# Patient Record
Sex: Male | Born: 1965 | Race: Black or African American | Hispanic: No | Smoking: Current every day smoker
Health system: Southern US, Community
[De-identification: ages and names within clinical notes are randomized; demographics above are authoritative.]

## PROBLEM LIST (undated history)

## (undated) DIAGNOSIS — N183 Chronic kidney disease, stage 3 unspecified: Secondary | ICD-10-CM

## (undated) DIAGNOSIS — F1910 Other psychoactive substance abuse, uncomplicated: Secondary | ICD-10-CM

## (undated) DIAGNOSIS — B1920 Unspecified viral hepatitis C without hepatic coma: Secondary | ICD-10-CM

## (undated) DIAGNOSIS — C8589 Other specified types of non-Hodgkin lymphoma, extranodal and solid organ sites: Secondary | ICD-10-CM

## (undated) DIAGNOSIS — B20 Human immunodeficiency virus [HIV] disease: Secondary | ICD-10-CM

## (undated) DIAGNOSIS — E039 Hypothyroidism, unspecified: Secondary | ICD-10-CM

## (undated) DIAGNOSIS — R569 Unspecified convulsions: Secondary | ICD-10-CM

## (undated) DIAGNOSIS — Z21 Asymptomatic human immunodeficiency virus [HIV] infection status: Secondary | ICD-10-CM

## (undated) DIAGNOSIS — C8339 Primary central nervous system lymphoma: Secondary | ICD-10-CM

## (undated) HISTORY — PX: CRANIOTOMY: SHX93

---

## 2016-04-29 ENCOUNTER — Emergency Department: Payer: Self-pay

## 2016-04-29 ENCOUNTER — Observation Stay
Admission: EM | Admit: 2016-04-29 | Discharge: 2016-04-30 | Disposition: A | Discharge status: Home / Self Care | Payer: Self-pay | Attending: Internal Medicine | Admitting: Internal Medicine

## 2016-04-29 DIAGNOSIS — R569 Unspecified convulsions: Secondary | ICD-10-CM | POA: Insufficient documentation

## 2016-04-29 DIAGNOSIS — Z8572 Personal history of non-Hodgkin lymphomas: Secondary | ICD-10-CM | POA: Insufficient documentation

## 2016-04-29 DIAGNOSIS — N179 Acute kidney failure, unspecified: Secondary | ICD-10-CM

## 2016-04-29 DIAGNOSIS — F17200 Nicotine dependence, unspecified, uncomplicated: Secondary | ICD-10-CM | POA: Insufficient documentation

## 2016-04-29 DIAGNOSIS — Z886 Allergy status to analgesic agent status: Secondary | ICD-10-CM | POA: Insufficient documentation

## 2016-04-29 DIAGNOSIS — Z882 Allergy status to sulfonamides status: Secondary | ICD-10-CM | POA: Insufficient documentation

## 2016-04-29 DIAGNOSIS — G40909 Epilepsy, unspecified, not intractable, without status epilepticus: Secondary | ICD-10-CM | POA: Insufficient documentation

## 2016-04-29 DIAGNOSIS — F1910 Other psychoactive substance abuse, uncomplicated: Secondary | ICD-10-CM

## 2016-04-29 DIAGNOSIS — B852 Pediculosis, unspecified: Secondary | ICD-10-CM

## 2016-04-29 DIAGNOSIS — G92 Toxic encephalopathy: Secondary | ICD-10-CM | POA: Insufficient documentation

## 2016-04-29 DIAGNOSIS — E039 Hypothyroidism, unspecified: Secondary | ICD-10-CM | POA: Insufficient documentation

## 2016-04-29 DIAGNOSIS — R4182 Altered mental status, unspecified: Secondary | ICD-10-CM

## 2016-04-29 DIAGNOSIS — T407X1A Poisoning by cannabis (derivatives), accidental (unintentional), initial encounter: Secondary | ICD-10-CM | POA: Insufficient documentation

## 2016-04-29 DIAGNOSIS — T405X1A Poisoning by cocaine, accidental (unintentional), initial encounter: Principal | ICD-10-CM | POA: Insufficient documentation

## 2016-04-29 DIAGNOSIS — B1920 Unspecified viral hepatitis C without hepatic coma: Secondary | ICD-10-CM | POA: Insufficient documentation

## 2016-04-29 DIAGNOSIS — F0390 Unspecified dementia without behavioral disturbance: Secondary | ICD-10-CM | POA: Insufficient documentation

## 2016-04-29 DIAGNOSIS — I4581 Long QT syndrome: Secondary | ICD-10-CM | POA: Insufficient documentation

## 2016-04-29 DIAGNOSIS — Y929 Unspecified place or not applicable: Secondary | ICD-10-CM | POA: Insufficient documentation

## 2016-04-29 DIAGNOSIS — G9389 Other specified disorders of brain: Secondary | ICD-10-CM | POA: Insufficient documentation

## 2016-04-29 DIAGNOSIS — N183 Chronic kidney disease, stage 3 (moderate): Secondary | ICD-10-CM | POA: Insufficient documentation

## 2016-04-29 DIAGNOSIS — B20 Human immunodeficiency virus [HIV] disease: Secondary | ICD-10-CM | POA: Insufficient documentation

## 2016-04-29 DIAGNOSIS — G319 Degenerative disease of nervous system, unspecified: Secondary | ICD-10-CM | POA: Insufficient documentation

## 2016-04-29 DIAGNOSIS — F0280 Dementia in other diseases classified elsewhere without behavioral disturbance: Secondary | ICD-10-CM | POA: Insufficient documentation

## 2016-04-29 HISTORY — DX: Hypothyroidism, unspecified: E03.9

## 2016-04-29 HISTORY — DX: Chronic kidney disease, stage 3 unspecified: N18.30

## 2016-04-29 HISTORY — DX: Unspecified convulsions: R56.9

## 2016-04-29 HISTORY — DX: Primary central nervous system lymphoma: C83.390

## 2016-04-29 HISTORY — DX: Unspecified viral hepatitis C without hepatic coma: B19.20

## 2016-04-29 HISTORY — DX: Other specified types of non-hodgkin lymphoma, extranodal and solid organ sites: C85.89

## 2016-04-29 HISTORY — DX: Human immunodeficiency virus (HIV) disease: B20

## 2016-04-29 HISTORY — DX: Asymptomatic human immunodeficiency virus (hiv) infection status: Z21

## 2016-04-29 HISTORY — DX: Other psychoactive substance abuse, uncomplicated: F19.10

## 2016-04-29 HISTORY — DX: Chronic kidney disease, stage 3 (moderate): N18.3

## 2016-04-29 LAB — URINALYSIS COMPLETE WITH MICROSCOPIC (ARMC ONLY)
BACTERIA UA: NONE SEEN
Bilirubin Urine: NEGATIVE
GLUCOSE, UA: NEGATIVE mg/dL
Hgb urine dipstick: NEGATIVE
Ketones, ur: NEGATIVE mg/dL
Leukocytes, UA: NEGATIVE
Nitrite: NEGATIVE
PROTEIN: NEGATIVE mg/dL
Specific Gravity, Urine: 1.015 (ref 1.005–1.030)
Squamous Epithelial / LPF: NONE SEEN
pH: 6 (ref 5.0–8.0)

## 2016-04-29 LAB — COMPREHENSIVE METABOLIC PANEL
ALBUMIN: 3.7 g/dL (ref 3.5–5.0)
ALK PHOS: 42 U/L (ref 38–126)
ALT: 11 U/L — ABNORMAL LOW (ref 17–63)
ANION GAP: 5 (ref 5–15)
AST: 24 U/L (ref 15–41)
BILIRUBIN TOTAL: 0.1 mg/dL — AB (ref 0.3–1.2)
BUN: 13 mg/dL (ref 6–20)
CALCIUM: 8.6 mg/dL — AB (ref 8.9–10.3)
CO2: 27 mmol/L (ref 22–32)
CREATININE: 2.04 mg/dL — AB (ref 0.61–1.24)
Chloride: 109 mmol/L (ref 101–111)
GFR calc Af Amer: 43 mL/min — ABNORMAL LOW (ref >60)
GFR calc non Af Amer: 37 mL/min — ABNORMAL LOW (ref >60)
GLUCOSE: 91 mg/dL (ref 65–99)
Potassium: 3.4 mmol/L — ABNORMAL LOW (ref 3.5–5.1)
Sodium: 141 mmol/L (ref 135–145)
TOTAL PROTEIN: 7.3 g/dL (ref 6.5–8.1)

## 2016-04-29 LAB — URINE DRUG SCREEN, QUALITATIVE (ARMC ONLY)
Amphetamines, Ur Screen: NOT DETECTED
BARBITURATES, UR SCREEN: NOT DETECTED
BENZODIAZEPINE, UR SCRN: NOT DETECTED
CANNABINOID 50 NG, UR ~~LOC~~: POSITIVE — AB
Cocaine Metabolite,Ur ~~LOC~~: POSITIVE — AB
MDMA (Ecstasy)Ur Screen: NOT DETECTED
Methadone Scn, Ur: NOT DETECTED
Opiate, Ur Screen: NOT DETECTED
Phencyclidine (PCP) Ur S: NOT DETECTED
TRICYCLIC, UR SCREEN: NOT DETECTED

## 2016-04-29 LAB — CBC
HCT: 34.2 % — ABNORMAL LOW (ref 40.0–52.0)
Hemoglobin: 11.7 g/dL — ABNORMAL LOW (ref 13.0–18.0)
MCH: 35.4 pg — AB (ref 26.0–34.0)
MCHC: 34.1 g/dL (ref 32.0–36.0)
MCV: 103.8 fL — ABNORMAL HIGH (ref 80.0–100.0)
PLATELETS: 172 10*3/uL (ref 150–440)
RBC: 3.3 MIL/uL — AB (ref 4.40–5.90)
RDW: 15.1 % — AB (ref 11.5–14.5)
WBC: 6.5 10*3/uL (ref 3.8–10.6)

## 2016-04-29 LAB — TSH: TSH: 3.447 u[IU]/mL (ref 0.350–4.500)

## 2016-04-29 LAB — SALICYLATE LEVEL: Salicylate Lvl: <4 mg/dL (ref 2.8–30.0)

## 2016-04-29 LAB — TROPONIN I: Troponin I: <0.03 ng/mL (ref <0.03)

## 2016-04-29 LAB — ETHANOL: Alcohol, Ethyl (B): <5 mg/dL (ref <5)

## 2016-04-29 LAB — LIPASE, BLOOD: LIPASE: 18 U/L (ref 11–51)

## 2016-04-29 LAB — ACETAMINOPHEN LEVEL

## 2016-04-29 MED ORDER — SODIUM CHLORIDE 0.9 % IV BOLUS (SEPSIS)
1000.0000 mL | Freq: Once | INTRAVENOUS | Status: AC
  Administered 2016-04-29: 1000 mL via INTRAVENOUS

## 2016-04-29 NOTE — ED Notes (Signed)
Pt noted to be infested with crawling insects that appear to be cockroaches. Charge RN made aware.
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
## 2016-04-29 NOTE — ED Notes (Signed)
Juanetta, EDT sitting at bedside for pt safety.

## 2016-04-29 NOTE — ED Provider Notes (Signed)
[facility] Emergency Department Provider Note   ____________________________________________   First MD Initiated Contact with Patient 04/29/16 2007     (approximate)  I have reviewed the triage vital signs and the nursing notes.   HISTORY  Chief Complaint Drug Overdose   HPI Darius [name] is a 50 y.o. male with a history of drug abuse and HIV who is presenting to the emergency department with altered mental status. He was found unresponsive with pinpoint pupils and given 2 doses of intranasal Narcan. After the Narcan he then had an improvement in his mental status but was found to be hypotensive to the [id number], systolic, when standing.  At this point he has no complaints. He denies any pain. Does not report any fever. However, he is also unable to remember the events of earlier today and why he became altered. He denies any drug use.   Past Medical History:  Diagnosis Date  . CKD (chronic kidney disease), stage III   . CNS lymphoma ([place])   . Drug abuse   . Hepatitis C   . HIV (human immunodeficiency virus infection) ([place])   . Hypothyroidism   . Seizures [facility])     Patient Active Problem List   Diagnosis Date Noted  . HIV (human immunodeficiency virus infection) ([place]) 04/29/2016  . Altered mental state 04/29/2016  . Drug abuse 04/29/2016  . Hypothyroidism 04/29/2016  . Seizures ([place]) 04/29/2016    Past Surgical History:  Procedure Laterality Date  . CRANIOTOMY      Prior to Admission medications   Medication Sig Start Date End Date Taking? Authorizing Provider  abacavir-dolutegravir-lamiVUDine (TRIUMEQ) 600-50-300 MG tablet Take 1 tablet by mouth daily.   Yes Historical Provider, MD  levETIRAcetam (KEPPRA) 500 MG tablet Take 1,000 mg by mouth 2 (two) times daily.   Yes Historical Provider, MD  levothyroxine (SYNTHROID, LEVOTHROID) 50 MCG tablet Take 50 mcg by mouth daily before breakfast.   Yes Historical Provider, MD    Allergies Asa  [aspirin] and Sulfa antibiotics  Family History  Problem Relation Age of Onset  . Family history unknown: Yes    Social History Social History  Substance Use Topics  . Smoking status: Current Every Day Smoker  . Smokeless tobacco: Not on file  . Alcohol use No    Review of Systems Constitutional: No fever/chills Eyes: No visual changes. ENT: No sore throat. Cardiovascular: Denies chest pain. Respiratory: Denies shortness of breath. Gastrointestinal: No abdominal pain.  No nausea, no vomiting.  No diarrhea.  No constipation. Genitourinary: Negative for dysuria. Musculoskeletal: Negative for back pain. Skin: Negative for rash. Neurological: Negative for headaches, focal weakness or numbness.  10-point ROS otherwise negative.  ____________________________________________   PHYSICAL EXAM:  VITAL SIGNS: ED Triage Vitals  Enc Vitals Group     BP 04/29/16 1946 96/71     Pulse Rate 04/29/16 1946 74     Resp 04/29/16 1946 18     Temp 04/29/16 1946 97.4 F (36.3 C)     Temp Source 04/29/16 1946 Oral     SpO2 04/29/16 1940 98 %     Weight 04/29/16 1947 200 lb (90.7 kg)     Height 04/29/16 1947 6' (1.829 m)     Head Circumference --      Peak Flow --      Pain Score 04/29/16 1948 0     Pain Loc --      Pain Edu? --      Excl. in [place]? --  Constitutional: Alert and orientedTo self as well as birthdate. Disheveled and in no acute distress. Eyes: Conjunctivae are normal. PERRL. EOMI. Head: Atraumatic. Nose: No congestion/rhinnorhea. Mouth/Throat: Mucous membranes are moist.   Neck: No stridor.   Cardiovascular: Normal rate, regular rhythm.  Respiratory: Normal respiratory effort.  No retractions. Lungs CTAB. Gastrointestinal: Soft and nontender. No distention.  Musculoskeletal: No lower extremity tenderness nor edema.  No joint effusions. Neurologic:  Normal speech and language. No gross focal neurologic deficits are appreciated.  Skin:  Skin is warm, dry and  intact. No rash noted. Psychiatric: Odd affect but normal mood.  ____________________________________________   LABS (all labs ordered are listed, but only abnormal results are displayed)  Labs Reviewed  COMPREHENSIVE METABOLIC PANEL - Abnormal; Notable for the following:       Result Value   Potassium 3.4 (*)    Creatinine, Ser 2.04 (*)    Calcium 8.6 (*)    ALT 11 (*)    Total Bilirubin 0.1 (*)    GFR calc non Af Amer 37 (*)    GFR calc Af Amer 43 (*)    All other components within normal limits  CBC - Abnormal; Notable for the following:    RBC 3.30 (*)    Hemoglobin 11.7 (*)    HCT 34.2 (*)    MCV 103.8 (*)    MCH 35.4 (*)    RDW 15.1 (*)    All other components within normal limits  URINE DRUG SCREEN, QUALITATIVE ([facility] ONLY) - Abnormal; Notable for the following:    Cocaine Metabolite,Ur [place] POSITIVE (*)    Cannabinoid 50 Ng, Ur [place] POSITIVE (*)    All other components within normal limits  ACETAMINOPHEN LEVEL - Abnormal; Notable for the following:    Acetaminophen (Tylenol), Serum <10 (*)    All other components within normal limits  URINALYSIS COMPLETEWITH MICROSCOPIC ([facility] ONLY) - Abnormal; Notable for the following:    Color, Urine YELLOW (*)    APPearance CLEAR (*)    All other components within normal limits  ETHANOL  LIPASE, BLOOD  SALICYLATE LEVEL  TROPONIN I  TSH   ____________________________________________  EKG  ED ECG REPORT I, Doran Stabler, the attending physician, personally viewed and interpreted this ECG.   Date: 04/29/2016  EKG Time: 2012  Rate: 70  Rhythm: normal sinus rhythm  Axis: Normal axis  Intervals:none  ST&T Change: T wave inversions in 2, 3 and aVF. Also T wave inversions in V5 and V6. No ST elevation or depression. No previous EKGs for comparison.  ____________________________________________  RADIOLOGY  CT Head Wo Contrast (Accession [id number]) (Order [id number])  Imaging  Date: 04/29/2016 Department: [facility] EMERGENCY DEPARTMENT Released By/Authorizing: Orbie Pyo, MD (auto-released)  PACS Images   Show images for CT Head Wo Contrast  Study Result   CLINICAL DATA:  Possible drug overdose. Pinpoint pupils. Altered mental status.  EXAM: CT HEAD WITHOUT CONTRAST  TECHNIQUE: Contiguous axial images were obtained from the base of the skull through the vertex without intravenous contrast.  COMPARISON:  None.  FINDINGS: Diffusely enlarged ventricles and subarachnoid spaces. Patchy white matter low density in both cerebral hemispheres. Left frontal post craniotomy changes with underlying encephalomalacia and ex vacuo enlargement of the left lateral ventricle. There is also dystrophic calcification along the lateral margin of the frontal horn of the left lateral ventricle. There are also dystrophic calcifications in the central pons. A left lateral subdural membrane is demonstrated with no intracranial  hemorrhage, mass lesion or CT evidence of acute infarction.  IMPRESSION: 1. No acute abnormality. 2. Left frontal post craniotomy changes with underlying encephalomalacia and ex vacuo enlargement of the left lateral ventricle. 3. Moderate diffuse cerebral and cerebellar atrophy. 4. Mild chronic small vessel white matter ischemic changes in the right cerebral hemisphere. 5. Dystrophic calcifications, as described above.   Electronically Signed   By: Claudie Revering M.D.   On: 04/29/2016 23:00     DG Chest 1 View (Accession [id number]) (Order [id number])  Imaging  Date: 04/29/2016 Department: [facility] EMERGENCY DEPARTMENT Released By/Authorizing: Orbie Pyo, MD (auto-released)  PACS Images   Show images for DG Chest 1 View  Study Result   CLINICAL DATA:  Altered mental status  EXAM: CHEST 1 VIEW  COMPARISON:  None.  FINDINGS: Low lung volumes. Heart size and mediastinal contour are  within normal limits accounting for the low lung volumes. No pneumothorax. No pleural effusion. Mild hypoventilatory changes at the lung bases. No acute consolidative airspace disease. No pulmonary edema.  IMPRESSION: Hypoinspiratory technique.  No active disease.   Electronically Signed   By: Ilona Sorrel M.D.   On: 04/29/2016 20:32     ____________________________________________   PROCEDURES  Procedure(s) performed:   Procedures  Critical Care performed:   ____________________________________________   INITIAL IMPRESSION / ASSESSMENT AND PLAN / ED COURSE  Pertinent labs & imaging results that were available during my care of the patient were reviewed by me and considered in my medical decision making (see chart for details).  ----------------------------------------- 8:40 PM on 04/29/2016 -----------------------------------------  Patient noted by nursing to have small insects crawling on feet the patient's shoes as well as his sheets. I went back in the room also asked the patient if he has a known history of HIV as his nose on his record and he denies. He also asked me "what is this?" And points at his IV in his left upper extremity. Patient continues to have an altered mental status.  Clinical Course   ----------------------------------------- 11:13 PM on 04/29/2016 -----------------------------------------  Patient continues to be altered. His mother called the emergency department and I was able to speak with her. She says that the patient does have some baseline confusion and usually does not have an extensive conversation but will answer yes or no appropriately. She says it does seem unusual that he did not know where he was and also that he did not know what year it was born in. He was found to be positive for both cocaine and marijuana in his urine. He also has an elevated creatinine with hypotension initially. I'm now able to see his care everywhere and  he has a creatinine of what appears to be 1.5-1.9 and his baseline. It is possible that his presentation is all drug induced. However, he does not appear to really back to baseline and with what appeared to be lice requiring decontamination. I do not believe him to be safe for discharge at this time. He will be admitted to the hospital. Signed out to Dr. Jannifer Franklin.  ____________________________________________   FINAL CLINICAL IMPRESSION(S) / ED DIAGNOSES  Altered mental status. Polysubstance abuse. Acute on chronic kidney injury.    NEW MEDICATIONS STARTED DURING THIS VISIT:  New Prescriptions   No medications on file     Note:  This document was prepared using Dragon voice recognition software and may include unintentional dictation errors.    Orbie Pyo, MD 04/29/16 [phone]
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
## 2016-04-29 NOTE — ED Notes (Signed)
Pt taken into [place] shower to remove insect infested clothing. Pt's clothing double-bagged and pt dressed in blue disposable hospital scrubs.

## 2016-04-29 NOTE — ED Notes (Signed)
Mother reports PMH of brain tumor. Mother's [employer]) phone number: H [phone], Cell [phone]

## 2016-04-30 DIAGNOSIS — F0280 Dementia in other diseases classified elsewhere without behavioral disturbance: Secondary | ICD-10-CM

## 2016-04-30 DIAGNOSIS — F1910 Other psychoactive substance abuse, uncomplicated: Secondary | ICD-10-CM

## 2016-04-30 DIAGNOSIS — R410 Disorientation, unspecified: Secondary | ICD-10-CM

## 2016-04-30 LAB — BASIC METABOLIC PANEL
Anion gap: 4 — ABNORMAL LOW (ref 5–15)
BUN: 11 mg/dL (ref 6–20)
CHLORIDE: 114 mmol/L — AB (ref 101–111)
CO2: 23 mmol/L (ref 22–32)
CREATININE: 1.7 mg/dL — AB (ref 0.61–1.24)
Calcium: 8 mg/dL — ABNORMAL LOW (ref 8.9–10.3)
GFR calc Af Amer: 54 mL/min — ABNORMAL LOW (ref >60)
GFR calc non Af Amer: 46 mL/min — ABNORMAL LOW (ref >60)
Glucose, Bld: 84 mg/dL (ref 65–99)
POTASSIUM: 3.9 mmol/L (ref 3.5–5.1)
SODIUM: 141 mmol/L (ref 135–145)

## 2016-04-30 LAB — CBC
HEMATOCRIT: 32.4 % — AB (ref 40.0–52.0)
Hemoglobin: 11.1 g/dL — ABNORMAL LOW (ref 13.0–18.0)
MCH: 35.2 pg — AB (ref 26.0–34.0)
MCHC: 34.2 g/dL (ref 32.0–36.0)
MCV: 103.1 fL — AB (ref 80.0–100.0)
PLATELETS: 167 10*3/uL (ref 150–440)
RBC: 3.14 MIL/uL — AB (ref 4.40–5.90)
RDW: 15.2 % — ABNORMAL HIGH (ref 11.5–14.5)
WBC: 7.2 10*3/uL (ref 3.8–10.6)

## 2016-04-30 MED ORDER — ACETAMINOPHEN 650 MG RE SUPP: 650.0000 mg | Freq: Four times a day (QID) | RECTAL | Status: DC | PRN

## 2016-04-30 MED ORDER — ONDANSETRON HCL 4 MG PO TABS: 4.0000 mg | ORAL_TABLET | Freq: Four times a day (QID) | ORAL | Status: DC | PRN

## 2016-04-30 MED ORDER — ACETAMINOPHEN 325 MG PO TABS: 650.0000 mg | ORAL_TABLET | Freq: Four times a day (QID) | ORAL | Status: DC | PRN

## 2016-04-30 MED ORDER — SODIUM CHLORIDE 0.9% FLUSH
3.0000 mL | Freq: Two times a day (BID) | INTRAVENOUS | Status: DC
  Administered 2016-04-30 (×2): 3 mL via INTRAVENOUS

## 2016-04-30 MED ORDER — LEVOTHYROXINE SODIUM 50 MCG PO TABS
50.0000 ug | ORAL_TABLET | Freq: Every day | ORAL | Status: DC
  Administered 2016-04-30: 50 ug via ORAL
  Filled 2016-04-30: qty 1

## 2016-04-30 MED ORDER — ONDANSETRON HCL 4 MG/2ML IJ SOLN: 4.0000 mg | Freq: Four times a day (QID) | INTRAMUSCULAR | Status: DC | PRN

## 2016-04-30 MED ORDER — ABACAVIR-DOLUTEGRAVIR-LAMIVUD 600-50-300 MG PO TABS
1.0000 | ORAL_TABLET | Freq: Every day | ORAL | Status: DC
  Administered 2016-04-30: 10:00:00 1 via ORAL
  Filled 2016-04-30: qty 1

## 2016-04-30 MED ORDER — LEVETIRACETAM 500 MG PO TABS
1000.0000 mg | ORAL_TABLET | Freq: Two times a day (BID) | ORAL | Status: DC
  Administered 2016-04-30 (×2): 1000 mg via ORAL
  Filled 2016-04-30 (×2): qty 2

## 2016-04-30 MED ORDER — HEPARIN SODIUM (PORCINE) 5000 UNIT/ML IJ SOLN: 5000.0000 [IU] | Freq: Three times a day (TID) | INTRAMUSCULAR | Status: DC

## 2016-04-30 NOTE — Consult Note (Signed)
Reason for Consult:Altered mental status Referring Physician: Posey Pronto  CC: Found down  HPI: Darius [name] is an 50 y.o. male with a history of CNS lymphoma s/p surgery and a history of seizures who was found by someone on the street unresponsive and EMS was called. EMS brought him in and he initially had pinpoint pupils. He was given some Narcan and seemed to respond. Workup in the ED showed tox screen positive for cocaine and cannabinoids. Patient was minimally verbal in the ED but is currently responsive yet unable to provide much in the way of history or information. Unclear if he has been compliant with his Keppra.  Past Medical History:  Diagnosis Date  . CKD (chronic kidney disease), stage III   . CNS lymphoma ([place])   . Drug abuse   . Hepatitis C   . HIV (human immunodeficiency virus infection) ([place])   . Hypothyroidism   . Seizures ([place])     Past Surgical History:  Procedure Laterality Date  . CRANIOTOMY      Family History  Problem Relation Age of Onset  . Family history unknown: Yes    Social History:  reports that he has been smoking.  He does not have any smokeless tobacco history on file. He reports that he uses drugs. He reports that he does not drink alcohol.  Allergies  Allergen Reactions  . Asa [Aspirin]   . Sulfa Antibiotics     Medications:  I have reviewed the patient's current medications. Prior to Admission:  Prescriptions Prior to Admission  Medication Sig Dispense Refill Last Dose  . abacavir-dolutegravir-lamiVUDine (TRIUMEQ) 600-50-300 MG tablet Take 1 tablet by mouth daily.   unknown at unknown  . levETIRAcetam (KEPPRA) 500 MG tablet Take 1,000 mg by mouth 2 (two) times daily.   unknown at unknown  . levothyroxine (SYNTHROID, LEVOTHROID) 50 MCG tablet Take 50 mcg by mouth daily before breakfast.   unknown at unknown   Scheduled: . abacavir-dolutegravir-lamiVUDine  1 tablet Oral Daily  . heparin  5,000 Units Subcutaneous Q8H  . levETIRAcetam   1,000 mg Oral BID  . levothyroxine  50 mcg Oral QAC breakfast  . sodium chloride flush  3 mL Intravenous Q12H    ROS: History obtained from the patient  General ROS: negative for - chills, fatigue, fever, night sweats, weight gain or weight loss Psychological ROS: memory difficulties Ophthalmic ROS: negative for - blurry vision, double vision, eye pain or loss of vision ENT ROS: negative for - epistaxis, nasal discharge, oral lesions, sore throat, tinnitus or vertigo Allergy and Immunology ROS: negative for - hives or itchy/watery eyes Hematological and Lymphatic ROS: negative for - bleeding problems, bruising or swollen lymph nodes Endocrine ROS: negative for - galactorrhea, hair pattern changes, polydipsia/polyuria or temperature intolerance Respiratory ROS: negative for - cough, hemoptysis, shortness of breath or wheezing Cardiovascular ROS: negative for - chest pain, dyspnea on exertion, edema or irregular heartbeat Gastrointestinal ROS: negative for - abdominal pain, diarrhea, hematemesis, nausea/vomiting or stool incontinence Genito-Urinary ROS: negative for - dysuria, hematuria, incontinence or urinary frequency/urgency Musculoskeletal ROS: negative for - joint swelling or muscular weakness Neurological ROS: as noted in HPI Dermatological ROS: negative for rash and skin lesion changes  Physical Examination: Blood pressure 109/70, pulse 80, temperature 98.8 F (37.1 C), temperature source Oral, resp. rate 16, height 6' (1.829 m), weight 65.8 kg (145 lb), SpO2 100 %.  HEENT-  Normocephalic, no lesions, without obvious abnormality.  Normal external eye and conjunctiva.  Normal TM's bilaterally.  Normal auditory canals and external ears. Normal external nose, mucus membranes and septum.  Normal pharynx. Cardiovascular- S1, S2 normal, pulses palpable throughout   Lungs- chest clear, no wheezing, rales, normal symmetric air entry Abdomen- soft, non-tender; bowel sounds normal; no  masses,  no organomegaly Extremities- no edema Lymph-no adenopathy palpable Musculoskeletal-no joint tenderness, deformity or swelling Skin-warm and dry, no hyperpigmentation, vitiligo, or suspicious lesions  Neurological Examination Mental Status: Alert, oriented to name and place, thought content appropriate.  Speech fluent without evidence of aphasia.  Able to follow 3 step commands without difficulty. Cranial Nerves: II: Discs flat bilaterally; Visual fields grossly normal, pupils equal, round, reactive to light and accommodation III,IV, VI: ptosis not present, extra-ocular motions intact bilaterally V,VII: mild right facial droop, facial light touch sensation normal bilaterally VIII: hearing normal bilaterally IX,X: gag reflex present XI: bilateral shoulder shrug XII: midline tongue extension Motor: Right : Upper extremity   5/5    Left:     Upper extremity   5/5  Lower extremity   5/5     Lower extremity   5/5 Tone and bulk:normal tone throughout; no atrophy noted Sensory: Pinprick and light touch intact throughout, bilaterally Deep Tendon Reflexes: 2+ and symmetric throughout Plantars: Right: upgoing   Left: upgoing Cerebellar: Normal finger-to-nose and normal heel-to-shin testing bilaterally Gait: not tested due to safety concerns   Laboratory Studies:   Basic Metabolic Panel:  Recent Labs Lab 04/29/16 1957 04/30/16 0636  NA 141 141  K 3.4* 3.9  CL 109 114*  CO2 27 23  GLUCOSE 91 84  BUN 13 11  CREATININE 2.04* 1.70*  CALCIUM 8.6* 8.0*    Liver Function Tests:  Recent Labs Lab 04/29/16 1957  AST 24  ALT 11*  ALKPHOS 42  BILITOT 0.1*  PROT 7.3  ALBUMIN 3.7    Recent Labs Lab 04/29/16 1957  LIPASE 18   No results for input(s): AMMONIA in the last 168 hours.  CBC:  Recent Labs Lab 04/29/16 1957 04/30/16 0636  WBC 6.5 7.2  HGB 11.7* 11.1*  HCT 34.2* 32.4*  MCV 103.8* 103.1*  PLT 172 167    Cardiac Enzymes:  Recent Labs Lab  04/29/16 1957  TROPONINI <0.03    BNP: Invalid input(s): POCBNP  CBG: No results for input(s): GLUCAP in the last 168 hours.  Microbiology: No results found for this or any previous visit.  Coagulation Studies: No results for input(s): LABPROT, INR in the last 72 hours.  Urinalysis:  Recent Labs Lab 04/29/16 2154  COLORURINE YELLOW*  LABSPEC 1.015  PHURINE 6.0  GLUCOSEU NEGATIVE  HGBUR NEGATIVE  BILIRUBINUR NEGATIVE  KETONESUR NEGATIVE  PROTEINUR NEGATIVE  NITRITE NEGATIVE  LEUKOCYTESUR NEGATIVE    Lipid Panel:  No results found for: CHOL, TRIG, HDL, CHOLHDL, VLDL, LDLCALC  HgbA1C: No results found for: HGBA1C  Urine Drug Screen:     Component Value Date/Time   LABOPIA NONE DETECTED 04/29/2016 2154   COCAINSCRNUR POSITIVE (A) 04/29/2016 2154   LABBENZ NONE DETECTED 04/29/2016 2154   AMPHETMU NONE DETECTED 04/29/2016 2154   THCU POSITIVE (A) 04/29/2016 2154   LABBARB NONE DETECTED 04/29/2016 2154    Alcohol Level:  Recent Labs Lab 04/29/16 1957  ETH <5    Other results: EKG: normal sinus rhythm at 70 bpm.  Imaging: Dg Chest 1 View  Result Date: 04/29/2016 CLINICAL DATA:  Altered mental status EXAM: CHEST 1 VIEW COMPARISON:  None. FINDINGS: Low lung volumes. Heart size and mediastinal contour are within normal limits  accounting for the low lung volumes. No pneumothorax. No pleural effusion. Mild hypoventilatory changes at the lung bases. No acute consolidative airspace disease. No pulmonary edema. IMPRESSION: Hypoinspiratory technique.  No active disease. Electronically Signed   By: Ilona Sorrel M.D.   On: 04/29/2016 20:32   Ct Head Wo Contrast  Result Date: 04/29/2016 CLINICAL DATA:  Possible drug overdose. Pinpoint pupils. Altered mental status. EXAM: CT HEAD WITHOUT CONTRAST TECHNIQUE: Contiguous axial images were obtained from the base of the skull through the vertex without intravenous contrast. COMPARISON:  None. FINDINGS: Diffusely enlarged  ventricles and subarachnoid spaces. Patchy white matter low density in both cerebral hemispheres. Left frontal post craniotomy changes with underlying encephalomalacia and ex vacuo enlargement of the left lateral ventricle. There is also dystrophic calcification along the lateral margin of the frontal horn of the left lateral ventricle. There are also dystrophic calcifications in the central pons. A left lateral subdural membrane is demonstrated with no intracranial hemorrhage, mass lesion or CT evidence of acute infarction. IMPRESSION: 1. No acute abnormality. 2. Left frontal post craniotomy changes with underlying encephalomalacia and ex vacuo enlargement of the left lateral ventricle. 3. Moderate diffuse cerebral and cerebellar atrophy. 4. Mild chronic small vessel white matter ischemic changes in the right cerebral hemisphere. 5. Dystrophic calcifications, as described above. Electronically Signed   By: Claudie Revering M.D.   On: 04/29/2016 23:00     Assessment/Plan: 50 year old male presenting after being found down.  Tox screen positive.  Patient appeared to respond to Narcan.  Suspect etiology of mental status change is drug use.  Patient now at baseline.  Head CT personally reviewed and shows no acute changes.    Recommendations: 1.  No further imaging indicated 2.  Agree with continued Keppra at home dose 3.  Patient to continue outpatient follow up  Alexis Goodell, MD Neurology [phone] 04/30/2016, 12:53 PM

## 2016-04-30 NOTE — Discharge Summary (Signed)
Darius [name] NAME: Darius [name]    MR#:  [id number]  DATE OF BIRTH:  [birth year]  DATE OF ADMISSION:  04/29/2016 ADMITTING PHYSICIAN: Lance Coon, MD  DATE OF DISCHARGE: 04/30/16  PRIMARY CARE PHYSICIAN: Pcp Not In System    ADMISSION DIAGNOSIS:  [place] [id number] Polysubstance abuse [F19.10] Acute kidney injury ([place]) [N17.9] Altered mental status, unspecified altered mental status type [R41.82]  DISCHARGE DIAGNOSIS:  Acute encephalopathy due to drug use (cocaine +marijuana) H/o HIV CKD-III  SECONDARY DIAGNOSIS:   Past Medical History:  Diagnosis Date  . CKD (chronic kidney disease), stage III   . CNS lymphoma ([place])   . Drug abuse   . Hepatitis C   . HIV (human immunodeficiency virus infection) ([place])   . Hypothyroidism   . Seizures [facility])     HOSPITAL COURSE:  Darius [name]  is a 50 y.o. male who presents with Altered mental state. Patient was found by someone on the street unresponsive and EMS was called. EMS brought him in, he initially had pinpoint pupils. He was given some Narcan and seemed to respond. Workup in the ED showed tox screen positive for cocaine and cannabinoids  1.Altered mental state - unclear what happened with this patient.  -pt was dropped off on his porch and EMS was sumoned (per mother) -mentaion improved -pt does not recall event from y'day other than spending time with people in the neighborhood -no witnessed seizures  2. Drug abuse - ?unintentional -UDS positive for cocaine and cannabinoids -Dr clapacs to see  3.  HIV (human immunodeficiency virus infection) ([place]) - -cont his home meds  4.  Seizures ([place]) - we'll continue home dose antiepileptics, seizure precautions while here  5.  Hypothyroidism - home dose thyroid replacement  Spoke with mother and updated Pt is doing well. D/c home later if ok with psych CONSULTS OBTAINED:  Treatment Team:  Alexis Goodell, MD Gonzella Lex, MD  DRUG ALLERGIES:   Allergies  Allergen Reactions  . Asa [Aspirin]   . Sulfa Antibiotics     DISCHARGE MEDICATIONS:   Current Discharge Medication List    CONTINUE these medications which have NOT CHANGED   Details  abacavir-dolutegravir-lamiVUDine (TRIUMEQ) 600-50-300 MG tablet Take 1 tablet by mouth daily.    levETIRAcetam (KEPPRA) 500 MG tablet Take 1,000 mg by mouth 2 (two) times daily.    levothyroxine (SYNTHROID, LEVOTHROID) 50 MCG tablet Take 50 mcg by mouth daily before breakfast.        If you experience worsening of your admission symptoms, develop shortness of breath, life threatening emergency, suicidal or homicidal thoughts you must seek medical attention immediately by calling 911 or calling your MD immediately  if symptoms less severe.  You Must read complete instructions/literature along with all the possible adverse reactions/side effects for all the Medicines you take and that have been prescribed to you. Take any new Medicines after you have completely understood and accept all the possible adverse reactions/side effects.   Please note  You were cared for by a hospitalist during your hospital stay. If you have any questions about your discharge medications or the care you received while you were in the hospital after you are discharged, you can call the unit and asked to speak with the hospitalist on call if the hospitalist that took care of you is not available. Once you are discharged, your primary care physician will handle any further medical issues. Please note that NO  REFILLS for any discharge medications will be authorized once you are discharged, as it is imperative that you return to your primary care physician (or establish a relationship with a primary care physician if you do not have one) for your aftercare needs so that they can reassess your need for medications and monitor your lab values. Today   SUBJECTIVE   No complaints  VITAL  SIGNS:  Blood pressure 108/74, pulse 72, temperature 98.2 F (36.8 C), resp. rate 20, height 6' (1.829 m), weight 65.8 kg (145 lb), SpO2 100 %.  I/O:   Intake/Output Summary (Last 24 hours) at 04/30/16 1202 Last data filed at 04/30/16 0800  Gross per 24 hour  Intake              240 ml  Output                0 ml  Net              240 ml    PHYSICAL EXAMINATION:  GENERAL:  50 y.o.-year-old patient lying in the bed with no acute distress.  EYES: Pupils equal, round, reactive to light and accommodation. No scleral icterus. Extraocular muscles intact.  HEENT: Head atraumatic, normocephalic. Oropharynx and nasopharynx clear.  NECK:  Supple, no jugular venous distention. No thyroid enlargement, no tenderness.  LUNGS: Normal breath sounds bilaterally, no wheezing, rales,rhonchi or crepitation. No use of accessory muscles of respiration.  CARDIOVASCULAR: S1, S2 normal. No murmurs, rubs, or gallops.  ABDOMEN: Soft, non-tender, non-distended. Bowel sounds present. No organomegaly or mass.  EXTREMITIES: No pedal edema, cyanosis, or clubbing.  NEUROLOGIC: Cranial nerves II through XII are intact. Muscle strength 5/5 in all extremities. Sensation intact. Gait not checked.  PSYCHIATRIC: The patient is alert and oriented x 3.  SKIN: No obvious rash, lesion, or ulcer.   DATA REVIEW:   CBC   Recent Labs Lab 04/30/16 0636  WBC 7.2  HGB 11.1*  HCT 32.4*  PLT 167    Chemistries   Recent Labs Lab 04/29/16 1957 04/30/16 0636  NA 141 141  K 3.4* 3.9  CL 109 114*  CO2 27 23  GLUCOSE 91 84  BUN 13 11  CREATININE 2.04* 1.70*  CALCIUM 8.6* 8.0*  AST 24  --   ALT 11*  --   ALKPHOS 42  --   BILITOT 0.1*  --     Microbiology Results   No results found for this or any previous visit (from the past 240 hour(s)).  RADIOLOGY:  Dg Chest 1 View  Result Date: 04/29/2016 CLINICAL DATA:  Altered mental status EXAM: CHEST 1 VIEW COMPARISON:  None. FINDINGS: Low lung volumes. Heart size  and mediastinal contour are within normal limits accounting for the low lung volumes. No pneumothorax. No pleural effusion. Mild hypoventilatory changes at the lung bases. No acute consolidative airspace disease. No pulmonary edema. IMPRESSION: Hypoinspiratory technique.  No active disease. Electronically Signed   By: Ilona Sorrel M.D.   On: 04/29/2016 20:32   Ct Head Wo Contrast  Result Date: 04/29/2016 CLINICAL DATA:  Possible drug overdose. Pinpoint pupils. Altered mental status. EXAM: CT HEAD WITHOUT CONTRAST TECHNIQUE: Contiguous axial images were obtained from the base of the skull through the vertex without intravenous contrast. COMPARISON:  None. FINDINGS: Diffusely enlarged ventricles and subarachnoid spaces. Patchy white matter low density in both cerebral hemispheres. Left frontal post craniotomy changes with underlying encephalomalacia and ex vacuo enlargement of the left lateral ventricle. There is also dystrophic calcification  along the lateral margin of the frontal horn of the left lateral ventricle. There are also dystrophic calcifications in the central pons. A left lateral subdural membrane is demonstrated with no intracranial hemorrhage, mass lesion or CT evidence of acute infarction. IMPRESSION: 1. No acute abnormality. 2. Left frontal post craniotomy changes with underlying encephalomalacia and ex vacuo enlargement of the left lateral ventricle. 3. Moderate diffuse cerebral and cerebellar atrophy. 4. Mild chronic small vessel white matter ischemic changes in the right cerebral hemisphere. 5. Dystrophic calcifications, as described above. Electronically Signed   By: Claudie Revering M.D.   On: 04/29/2016 23:00     Management plans discussed with the patient, family and they are in agreement.  CODE STATUS:     Code Status Orders        Start     Ordered   04/30/16 0054  Full code  Continuous     04/30/16 0053    Code Status History    Date Active Date Inactive Code Status Order  ID Comments User Context   This patient has a current code status but no historical code status.      TOTAL TIME TAKING CARE OF THIS PATIENT: 40 minutes.    Hanley Rispoli M.D on 04/30/2016 at 12:02 PM  Between 7am to 6pm - Pager - [phone] After 6pm go to [email or web address] - password [email or web address] [email or web address]  Office  [phone]  CC: Primary care physician; Pcp Not In System

## 2016-04-30 NOTE — Progress Notes (Signed)
Patient discharged home per MD order. All discharge instructions given and all questions answered. 

## 2016-04-30 NOTE — Progress Notes (Signed)
Reported to Dr Posey Pronto that Dr Weber Cooks had seen patient. MD order to discharge patient home.

## 2016-04-30 NOTE — Discharge Instructions (Signed)
F/u your Infectious dz doctor in [place]

## 2016-04-30 NOTE — Consult Note (Signed)
[place] [facility] Consult   Reason for Consult:  Consult for this 50 year old man brought in to the hospital after being found unconscious at home Referring Physician:  Posey Pronto Patient Identification: Darius [name] MRN:  [id number] Principal Diagnosis: Altered mental state Diagnosis:   Patient Active Problem List   Diagnosis Date Noted  . 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 HIV (human immunodeficiency virus infection) ([place]) [Z21] 04/29/2016  . Altered mental state [R41.82] 04/29/2016  . Drug abuse [F19.10] 04/29/2016  . Hypothyroidism [E03.9] 04/29/2016  . Seizures ([place]) [R56.9] 04/29/2016    Total Time spent with patient: 1 hour  Subjective:   Darius [name] is a 50 y.o. male patient admitted with "something hit me".  HPI:  Patient interviewed. Chart reviewed. Labs and vitals reviewed. This 50 year old man was brought into the emergency room after being found unconscious and unresponsive yesterday. Patient was still showing altered mental status on admission. Eventually woke up. Now appears to be returning to his Asa line. Did not have any witnessed seizure activity here in the hospital. Patient tells me that he remembers walking down the street yesterday and then "something hit me". He clarifies that he does not mean that some object like a car hit him but that suddenly something happened in the next thing he remembers was being here in the emergency room. Patient does not have any idea what could've happened to him. He denies that he had been feeling in any way sick. Denies that he had been having any headache or any feeling of illness. He denies having been hungry or feeling dehydrated or overheated. He says he has been fully compliant with all of his prescribed medicine as usual. He says that he had been on his usual sleep schedule which is typically to sleep much of the daytime and be awake much of the night. He denies any mood symptoms and denies any  recent psychotic symptoms. Patient admits that he uses marijuana. He is a little evasive about how often he does it. I'm not sure if that's intentional or if it could be part of his memory problem. He admits that he probably smokes marijuana more than once a week but less than every day. He denies that he's been using any other drugs and denies any alcohol use.  Social history: Patient states that he lives with his mother and his own 3 year old son. He is disabled. Has little activity most of the day.  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 He tells me he is aware that he has AIDS. He says he takes his medicine and goes to the clinic at [facility] regularly. He denies knowing of any other medical problems. The patient denied to me when I ask him if he had a history of seizure disorder. This apparently is incorrect as he is Darius [name] taking any anticonvulsant on presentation.  Substance abuse history: Patient says he uses marijuana occasionally. He denies that there've been any change in the frequency of his usage. He denied that he had used any marijuana yesterday. He denied to me that he had been using any other drugs and denied alcohol abuse. I don't see anything in his old records to suggest any known past specific movement treatment for substance abuse issues.  Past Psychiatric History: Patient denies any history of psychiatric treatment. Never been in a psychiatric hospital. No history of suicidality or suicide attempts denies any history of violence. Not currently prescribed any  psychiatric medicine. Denies ever having any auditory or visual hallucinations.  Risk to Self: Is patient at risk for suicide?: No Risk to Others:   Prior Inpatient Therapy:   Prior Outpatient Therapy:    Past Medical History:  Past Medical History:  Diagnosis Date  . CKD (chronic kidney disease), stage III   . CNS lymphoma ([place])   . Drug abuse   . Hepatitis C   .  HIV (human immunodeficiency virus infection) ([place])   . Hypothyroidism   . Seizures ([place])     Past Surgical History:  Procedure Laterality Date  . CRANIOTOMY     Family History:  Family History  Problem Relation Age of Onset  . Family history unknown: Yes   Family Psychiatric  History: He denies being aware of any family history of mental illness or substance abuse problems Social History:  History  Alcohol Use No     History  Drug Use    Social History   Social History  . Marital status: Unknown    Spouse name: N/A  . Number of children: N/A  . Years of education: N/A   Social History Main Topics  . Smoking status: Current Every Day Smoker  . Smokeless tobacco: None  . Alcohol use No  . Drug use:   . Sexual activity: Not Asked   Other Topics Concern  . None   Social History Narrative  . None   Additional Social History:    Allergies:   Allergies  Allergen Reactions  . Asa [Aspirin]   . Sulfa Antibiotics     Labs:  Results for orders placed or performed during the hospital encounter of 04/29/16 (from the past 48 hour(s))  Comprehensive metabolic panel     Status: Abnormal   Collection Time: 04/29/16  7:57 PM  Result Value Ref Range   Sodium 141 135 - 145 mmol/L   Potassium 3.4 (L) 3.5 - 5.1 mmol/L   Chloride 109 101 - 111 mmol/L   CO2 27 22 - 32 mmol/L   Glucose, Bld 91 65 - 99 mg/dL   BUN 13 6 - 20 mg/dL   Creatinine, Ser 2.04 (H) 0.61 - 1.24 mg/dL   Calcium 8.6 (L) 8.9 - 10.3 mg/dL   Total Protein 7.3 6.5 - 8.1 g/dL   Albumin 3.7 3.5 - 5.0 g/dL   AST 24 15 - 41 U/L   ALT 11 (L) 17 - 63 U/L   Alkaline Phosphatase 42 38 - 126 U/L   Total Bilirubin 0.1 (L) 0.3 - 1.2 mg/dL   GFR calc non Af Amer 37 (L) >60 mL/min   GFR calc Af Amer 43 (L) >60 mL/min    Comment: (NOTE) The eGFR has been calculated using the CKD EPI equation. This calculation has not been validated in all clinical situations. eGFR's persistently <60 mL/min signify possible  Chronic Kidney Disease.    Anion gap 5 5 - 15  Ethanol     Status: None   Collection Time: 04/29/16  7:57 PM  Result Value Ref Range   Alcohol, Ethyl (B) <5 <5 mg/dL    Comment:        LOWEST DETECTABLE LIMIT FOR SERUM ALCOHOL IS 5 mg/dL FOR MEDICAL PURPOSES ONLY   cbc     Status: Abnormal   Collection Time: 04/29/16  7:57 PM  Result Value Ref Range   WBC 6.5 3.8 - 10.6 K/uL   RBC 3.30 (L) 4.40 - 5.90 MIL/uL   Hemoglobin 11.7 (L) 13.0 -  18.0 g/dL   HCT 34.2 (L) 40.0 - 52.0 %   MCV 103.8 (H) 80.0 - 100.0 fL   MCH 35.4 (H) 26.0 - 34.0 pg   MCHC 34.1 32.0 - 36.0 g/dL   RDW 15.1 (H) 11.5 - 14.5 %   Platelets 172 150 - 440 K/uL  Lipase, blood     Status: None   Collection Time: 04/29/16  7:57 PM  Result Value Ref Range   Lipase 18 11 - 51 U/L  Acetaminophen level     Status: Abnormal   Collection Time: 04/29/16  7:57 PM  Result Value Ref Range   Acetaminophen (Tylenol), Serum <10 (L) 10 - 30 ug/mL    Comment:        THERAPEUTIC CONCENTRATIONS VARY SIGNIFICANTLY. A RANGE OF 10-30 ug/mL MAY BE AN EFFECTIVE CONCENTRATION FOR MANY PATIENTS. HOWEVER, SOME ARE BEST TREATED AT CONCENTRATIONS OUTSIDE THIS RANGE. ACETAMINOPHEN CONCENTRATIONS >150 ug/mL AT 4 HOURS AFTER INGESTION AND >50 ug/mL AT 12 HOURS AFTER INGESTION ARE OFTEN ASSOCIATED WITH TOXIC REACTIONS.   Salicylate level     Status: None   Collection Time: 04/29/16  7:57 PM  Result Value Ref Range   Salicylate Lvl <4.9 2.8 - 30.0 mg/dL  Troponin I     Status: None   Collection Time: 04/29/16  7:57 PM  Result Value Ref Range   Troponin I <0.03 <0.03 ng/mL  TSH     Status: None   Collection Time: 04/29/16  7:57 PM  Result Value Ref Range   TSH 3.447 0.350 - 4.500 uIU/mL  Urine Drug Screen, Qualitative     Status: Abnormal   Collection Time: 04/29/16  9:54 PM  Result Value Ref Range   Tricyclic, Ur Screen NONE DETECTED NONE DETECTED   Amphetamines, Ur Screen NONE DETECTED NONE DETECTED   MDMA (Ecstasy)Ur  Screen NONE DETECTED NONE DETECTED   Cocaine Metabolite,Ur [place] POSITIVE (A) NONE DETECTED   Opiate, Ur Screen NONE DETECTED NONE DETECTED   Phencyclidine (PCP) Ur S NONE DETECTED NONE DETECTED   Cannabinoid 50 Ng, Ur [place] POSITIVE (A) NONE DETECTED   Barbiturates, Ur Screen NONE DETECTED NONE DETECTED   Benzodiazepine, Ur Scrn NONE DETECTED NONE DETECTED   Methadone Scn, Ur NONE DETECTED NONE DETECTED    Comment: (NOTE) 449  Tricyclics, urine               Cutoff 1000 ng/mL 200  Amphetamines, urine             Cutoff 1000 ng/mL 300  MDMA (Ecstasy), urine           Cutoff 500 ng/mL 400  Cocaine Metabolite, urine       Cutoff 300 ng/mL 500  Opiate, urine                   Cutoff 300 ng/mL 600  Phencyclidine (PCP), urine      Cutoff 25 ng/mL 700  Cannabinoid, urine              Cutoff 50 ng/mL 800  Barbiturates, urine             Cutoff 200 ng/mL 900  Benzodiazepine, urine           Cutoff 200 ng/mL 1000 Methadone, urine                Cutoff 300 ng/mL 1100 1200 The urine drug screen provides only a preliminary, unconfirmed 1300 analytical test result and should not be used for non-medical  1400 purposes. Clinical consideration and professional judgment should 1500 be applied to any positive drug screen result due to possible 1600 interfering substances. A more specific alternate chemical method 1700 must be used in order to obtain a confirmed analytical result.  1800 Gas chromato graphy / mass spectrometry (GC/MS) is the preferred 1900 confirmatory method.   Urinalysis complete, with microscopic ([facility] only)     Status: Abnormal   Collection Time: 04/29/16  9:54 PM  Result Value Ref Range   Color, Urine YELLOW (A) YELLOW   APPearance CLEAR (A) CLEAR   Glucose, UA NEGATIVE NEGATIVE mg/dL   Bilirubin Urine NEGATIVE NEGATIVE   Ketones, ur NEGATIVE NEGATIVE mg/dL   Specific Gravity, Urine 1.015 1.005 - 1.030   Hgb urine dipstick NEGATIVE NEGATIVE   pH 6.0 5.0 - 8.0   Protein, ur  NEGATIVE NEGATIVE mg/dL   Nitrite NEGATIVE NEGATIVE   Leukocytes, UA NEGATIVE NEGATIVE   RBC / HPF 0-5 0 - 5 RBC/hpf   WBC, UA 0-5 0 - 5 WBC/hpf   Bacteria, UA NONE SEEN NONE SEEN   Squamous Epithelial / LPF NONE SEEN NONE SEEN  Basic metabolic panel     Status: Abnormal   Collection Time: 04/30/16  6:36 AM  Result Value Ref Range   Sodium 141 135 - 145 mmol/L   Potassium 3.9 3.5 - 5.1 mmol/L   Chloride 114 (H) 101 - 111 mmol/L   CO2 23 22 - 32 mmol/L   Glucose, Bld 84 65 - 99 mg/dL   BUN 11 6 - 20 mg/dL   Creatinine, Ser 1.70 (H) 0.61 - 1.24 mg/dL   Calcium 8.0 (L) 8.9 - 10.3 mg/dL   GFR calc non Af Amer 46 (L) >60 mL/min   GFR calc Af Amer 54 (L) >60 mL/min    Comment: (NOTE) The eGFR has been calculated using the CKD EPI equation. This calculation has not been validated in all clinical situations. eGFR's persistently <60 mL/min signify possible Chronic Kidney Disease.    Anion gap 4 (L) 5 - 15  CBC     Status: Abnormal   Collection Time: 04/30/16  6:36 AM  Result Value Ref Range   WBC 7.2 3.8 - 10.6 K/uL   RBC 3.14 (L) 4.40 - 5.90 MIL/uL   Hemoglobin 11.1 (L) 13.0 - 18.0 g/dL   HCT 32.4 (L) 40.0 - 52.0 %   MCV 103.1 (H) 80.0 - 100.0 fL   MCH 35.2 (H) 26.0 - 34.0 pg   MCHC 34.2 32.0 - 36.0 g/dL   RDW 15.2 (H) 11.5 - 14.5 %   Platelets 167 150 - 440 K/uL    Current Facility-Administered Medications  Medication Dose Route Frequency Provider Last Rate Last Dose  . abacavir-dolutegravir-lamiVUDine (TRIUMEQ) [phone] MG per tablet 1 tablet  1 tablet Oral Daily Lance Coon, MD   1 tablet at 04/30/16 1008  . acetaminophen (TYLENOL) tablet 650 mg  650 mg Oral Q6H PRN Lance Coon, MD       Or  . acetaminophen (TYLENOL) suppository 650 mg  650 mg Rectal Q6H PRN Lance Coon, MD      . heparin injection 5,000 Units  5,000 Units Subcutaneous Q8H Lance Coon, MD      . levETIRAcetam (KEPPRA) tablet 1,000 mg  1,000 mg Oral BID Lance Coon, MD   1,000 mg at 04/30/16 1008  .  levothyroxine (SYNTHROID, LEVOTHROID) tablet 50 mcg  50 mcg Oral QAC breakfast Lance Coon, MD   50 mcg at 04/30/16 1008  .  ondansetron (ZOFRAN) tablet 4 mg  4 mg Oral Q6H PRN Lance Coon, MD       Or  . ondansetron [facility]) injection 4 mg  4 mg Intravenous Q6H PRN Lance Coon, MD      . sodium chloride flush (NS) 0.9 % injection 3 mL  3 mL Intravenous Q12H Lance Coon, MD   3 mL at 04/30/16 1008    Musculoskeletal: Strength & Muscle Tone: decreased Gait & Station: ataxic Patient leans: N/A  Psychiatric Specialty Exam: Physical Exam  Nursing note and vitals reviewed. Constitutional: He appears well-developed and well-nourished.  HENT:  Head: Normocephalic and atraumatic.  Eyes: Conjunctivae are normal. Pupils are equal, round, and reactive to light.  Neck: Normal range of motion.  Cardiovascular: Normal heart sounds.   Respiratory: Effort normal.  GI: Soft.  Musculoskeletal: Normal range of motion.  Neurological: He is alert. He exhibits abnormal muscle tone.  Skin: Skin is warm and dry.  Psychiatric: His affect is blunt. His speech is delayed. He is slowed. Thought content is not delusional. He expresses impulsivity. He expresses no suicidal ideation. He exhibits abnormal recent memory and abnormal remote memory.    Review of Systems  Constitutional: Negative.   HENT: Negative.   Eyes: Negative.   Respiratory: Negative.   Cardiovascular: Negative.   Gastrointestinal: Negative.   Musculoskeletal: Negative.   Skin: Negative.   Neurological: Negative.   Psychiatric/Behavioral: Positive for memory loss and substance abuse. Negative for depression, hallucinations and suicidal ideas. The patient has insomnia. The patient is not nervous/anxious.     Blood pressure 109/70, pulse 80, temperature 98.8 F (37.1 C), temperature source Oral, resp. rate 16, height 6' (1.829 m), weight 65.8 kg (145 lb), SpO2 100 %.Body mass index is 19.67 kg/m.  General Appearance: Casual  Eye  Contact:  Fair  Speech:  Slow and Slurred  Volume:  Decreased  Mood:  Euthymic  Affect:  Blunt  Thought Process:  Coherent  Orientation:  Other:  Patient knew he was in the hospital. He did not know which hospital he was in and he was wrong about both the day and the year and the month  Thought Content:  Tangential  Suicidal Thoughts:  No  Homicidal Thoughts:  No  Memory:  Immediate;   Fair Recent;   Poor Remote;   Fair  Judgement:  Impaired  Insight:  Shallow  Psychomotor Activity:  Decreased  Concentration:  Concentration: Poor  Recall:  Poor  Fund of Knowledge:  Fair  Language:  Fair  Akathisia:  No  Handed:  Right  AIMS (if indicated):     Assets:  Financial Resources/Insurance Housing Resilience Social Support  ADL's:  Intact  Cognition:  Impaired,  Mild and Moderate  Sleep:        Treatment Plan Summary: Plan This is a 50 year old man who apparently has chronic dementia presumably related to his history of CNS lymphoma and HIV disease. He tells me that he is feeling back to his baseline right now. He tells me also that being slow mentally is chronic for him since having his illness. I would agree with neurology that the most likely etiology of his presentation yesterday was drug use. He had cocaine positive in his drug screen. Certainly seems possible that he could've had a seizure related to cocaine use even though he is compliant with his anticonvulsant. Patient was educated about the dangers of continued abuse of marijuana and cocaine particularly given his history of neurologic illness and the risk  he continues to run of seizures or other bad outcome. He acknowledges that and says he is planning to try to cut down on his substance use. No indication for any psychiatric medicine or psychiatric hospitalization. No changed anything else in the treatment plan. Patient can otherwise be discharged as planned by hospitalist.  Disposition: Patient does not meet criteria for  psychiatric inpatient admission. Supportive therapy provided about ongoing stressors.  Alethia Berthold, MD 04/30/2016 1:15 PM

## 2016-04-30 NOTE — BH Assessment (Signed)
Writer informed psychiatrist (Dr. Clapacs) of consult. 

## 2016-12-13 ENCOUNTER — Emergency Department: Payer: Medicare Other

## 2016-12-13 ENCOUNTER — Inpatient Hospital Stay
Admission: EM | Admit: 2016-12-13 | Discharge: 2016-12-29 | DRG: 974 | Disposition: A | Discharge status: Skilled Nursing Facility | Payer: Medicare Other | Attending: Internal Medicine | Admitting: Internal Medicine

## 2016-12-13 ENCOUNTER — Encounter: Payer: Self-pay | Admitting: Emergency Medicine

## 2016-12-13 DIAGNOSIS — I129 Hypertensive chronic kidney disease with stage 1 through stage 4 chronic kidney disease, or unspecified chronic kidney disease: Secondary | ICD-10-CM | POA: Diagnosis not present

## 2016-12-13 DIAGNOSIS — R569 Unspecified convulsions: Secondary | ICD-10-CM

## 2016-12-13 DIAGNOSIS — F1410 Cocaine abuse, uncomplicated: Secondary | ICD-10-CM | POA: Diagnosis present

## 2016-12-13 DIAGNOSIS — R1310 Dysphagia, unspecified: Secondary | ICD-10-CM | POA: Diagnosis present

## 2016-12-13 DIAGNOSIS — R0602 Shortness of breath: Secondary | ICD-10-CM

## 2016-12-13 DIAGNOSIS — E43 Unspecified severe protein-calorie malnutrition: Secondary | ICD-10-CM | POA: Diagnosis present

## 2016-12-13 DIAGNOSIS — E039 Hypothyroidism, unspecified: Secondary | ICD-10-CM | POA: Diagnosis present

## 2016-12-13 DIAGNOSIS — H518 Other specified disorders of binocular movement: Secondary | ICD-10-CM | POA: Diagnosis not present

## 2016-12-13 DIAGNOSIS — K2980 Duodenitis without bleeding: Secondary | ICD-10-CM | POA: Diagnosis present

## 2016-12-13 DIAGNOSIS — Z79899 Other long term (current) drug therapy: Secondary | ICD-10-CM

## 2016-12-13 DIAGNOSIS — Z8572 Personal history of non-Hodgkin lymphomas: Secondary | ICD-10-CM

## 2016-12-13 DIAGNOSIS — Z888 Allergy status to other drugs, medicaments and biological substances status: Secondary | ICD-10-CM

## 2016-12-13 DIAGNOSIS — R630 Anorexia: Secondary | ICD-10-CM

## 2016-12-13 DIAGNOSIS — Z882 Allergy status to sulfonamides status: Secondary | ICD-10-CM | POA: Diagnosis not present

## 2016-12-13 DIAGNOSIS — Z23 Encounter for immunization: Secondary | ICD-10-CM

## 2016-12-13 DIAGNOSIS — F1910 Other psychoactive substance abuse, uncomplicated: Secondary | ICD-10-CM | POA: Diagnosis present

## 2016-12-13 DIAGNOSIS — Z681 Body mass index (BMI) 19 or less, adult: Secondary | ICD-10-CM | POA: Diagnosis not present

## 2016-12-13 DIAGNOSIS — R410 Disorientation, unspecified: Secondary | ICD-10-CM

## 2016-12-13 DIAGNOSIS — N183 Chronic kidney disease, stage 3 (moderate): Secondary | ICD-10-CM | POA: Diagnosis present

## 2016-12-13 DIAGNOSIS — Z4659 Encounter for fitting and adjustment of other gastrointestinal appliance and device: Secondary | ICD-10-CM

## 2016-12-13 DIAGNOSIS — B20 Human immunodeficiency virus [HIV] disease: Secondary | ICD-10-CM | POA: Diagnosis present

## 2016-12-13 DIAGNOSIS — J189 Pneumonia, unspecified organism: Secondary | ICD-10-CM | POA: Diagnosis present

## 2016-12-13 DIAGNOSIS — R4182 Altered mental status, unspecified: Secondary | ICD-10-CM | POA: Diagnosis present

## 2016-12-13 DIAGNOSIS — F17200 Nicotine dependence, unspecified, uncomplicated: Secondary | ICD-10-CM | POA: Diagnosis present

## 2016-12-13 DIAGNOSIS — B1920 Unspecified viral hepatitis C without hepatic coma: Secondary | ICD-10-CM | POA: Diagnosis present

## 2016-12-13 DIAGNOSIS — G9340 Encephalopathy, unspecified: Principal | ICD-10-CM

## 2016-12-13 DIAGNOSIS — F329 Major depressive disorder, single episode, unspecified: Secondary | ICD-10-CM | POA: Diagnosis not present

## 2016-12-13 DIAGNOSIS — B370 Candidal stomatitis: Secondary | ICD-10-CM | POA: Diagnosis present

## 2016-12-13 DIAGNOSIS — G40909 Epilepsy, unspecified, not intractable, without status epilepticus: Secondary | ICD-10-CM | POA: Diagnosis present

## 2016-12-13 DIAGNOSIS — Z515 Encounter for palliative care: Secondary | ICD-10-CM

## 2016-12-13 DIAGNOSIS — Z7189 Other specified counseling: Secondary | ICD-10-CM

## 2016-12-13 LAB — COMPREHENSIVE METABOLIC PANEL
ALT: 27 U/L (ref 17–63)
ANION GAP: 10 (ref 5–15)
AST: 50 U/L — ABNORMAL HIGH (ref 15–41)
Albumin: 4.9 g/dL (ref 3.5–5.0)
Alkaline Phosphatase: 66 U/L (ref 38–126)
BUN: 21 mg/dL — AB (ref 6–20)
CALCIUM: 10 mg/dL (ref 8.9–10.3)
CHLORIDE: 110 mmol/L (ref 101–111)
CO2: 27 mmol/L (ref 22–32)
CREATININE: 1.56 mg/dL — AB (ref 0.61–1.24)
GFR, EST AFRICAN AMERICAN: 59 mL/min — AB (ref >60)
GFR, EST NON AFRICAN AMERICAN: 51 mL/min — AB (ref >60)
Glucose, Bld: 110 mg/dL — ABNORMAL HIGH (ref 65–99)
POTASSIUM: 3.8 mmol/L (ref 3.5–5.1)
SODIUM: 147 mmol/L — AB (ref 135–145)
Total Bilirubin: 0.6 mg/dL (ref 0.3–1.2)
Total Protein: 10.5 g/dL — ABNORMAL HIGH (ref 6.5–8.1)

## 2016-12-13 LAB — LACTIC ACID, PLASMA: LACTIC ACID, VENOUS: 3 mmol/L — AB (ref 0.5–1.9)

## 2016-12-13 LAB — URINALYSIS, COMPLETE (UACMP) WITH MICROSCOPIC
BACTERIA UA: NONE SEEN
Bilirubin Urine: NEGATIVE
Glucose, UA: NEGATIVE mg/dL
Hgb urine dipstick: NEGATIVE
Ketones, ur: NEGATIVE mg/dL
Leukocytes, UA: NEGATIVE
NITRITE: NEGATIVE
PH: 5 (ref 5.0–8.0)
Protein, ur: 100 mg/dL — AB
SPECIFIC GRAVITY, URINE: 1.028 (ref 1.005–1.030)
SQUAMOUS EPITHELIAL / LPF: NONE SEEN
WBC, UA: NONE SEEN WBC/hpf (ref 0–5)

## 2016-12-13 LAB — CBC WITH DIFFERENTIAL/PLATELET
Basophils Absolute: 0 10*3/uL (ref 0–0.1)
Basophils Relative: 0 %
EOS ABS: 0 10*3/uL (ref 0–0.7)
EOS PCT: 0 %
HCT: 42.2 % (ref 40.0–52.0)
Hemoglobin: 14.1 g/dL (ref 13.0–18.0)
LYMPHS ABS: 1.7 10*3/uL (ref 1.0–3.6)
Lymphocytes Relative: 17 %
MCH: 33.5 pg (ref 26.0–34.0)
MCHC: 33.5 g/dL (ref 32.0–36.0)
MCV: 99.9 fL (ref 80.0–100.0)
Monocytes Absolute: 1.2 10*3/uL — ABNORMAL HIGH (ref 0.2–1.0)
Monocytes Relative: 11 %
Neutro Abs: 7.5 10*3/uL — ABNORMAL HIGH (ref 1.4–6.5)
Neutrophils Relative %: 72 %
PLATELETS: 191 10*3/uL (ref 150–440)
RBC: 4.22 MIL/uL — AB (ref 4.40–5.90)
RDW: 15.3 % — ABNORMAL HIGH (ref 11.5–14.5)
WBC: 10.5 10*3/uL (ref 3.8–10.6)

## 2016-12-13 LAB — URINE DRUG SCREEN, QUALITATIVE (ARMC ONLY)
AMPHETAMINES, UR SCREEN: NOT DETECTED
Barbiturates, Ur Screen: NOT DETECTED
Benzodiazepine, Ur Scrn: NOT DETECTED
Cannabinoid 50 Ng, Ur ~~LOC~~: NOT DETECTED
Cocaine Metabolite,Ur ~~LOC~~: POSITIVE — AB
MDMA (ECSTASY) UR SCREEN: NOT DETECTED
Methadone Scn, Ur: NOT DETECTED
Opiate, Ur Screen: NOT DETECTED
Phencyclidine (PCP) Ur S: NOT DETECTED
TRICYCLIC, UR SCREEN: NOT DETECTED

## 2016-12-13 LAB — INFLUENZA PANEL BY PCR (TYPE A & B)
INFLBPCR: NEGATIVE
Influenza A By PCR: NEGATIVE

## 2016-12-13 LAB — AMMONIA: AMMONIA: 37 umol/L — AB (ref 9–35)

## 2016-12-13 LAB — ETHANOL

## 2016-12-13 LAB — LIPASE, BLOOD: Lipase: <10 U/L — ABNORMAL LOW (ref 11–51)

## 2016-12-13 MED ORDER — PIPERACILLIN-TAZOBACTAM 3.375 G IVPB
3.3750 g | Freq: Three times a day (TID) | INTRAVENOUS | Status: DC
  Administered 2016-12-14 (×2): 3.375 g via INTRAVENOUS
  Filled 2016-12-13 (×2): qty 50

## 2016-12-13 MED ORDER — VANCOMYCIN HCL IN DEXTROSE 750-5 MG/150ML-% IV SOLN
750.0000 mg | Freq: Two times a day (BID) | INTRAVENOUS | Status: DC
  Administered 2016-12-14 – 2016-12-15 (×3): 750 mg via INTRAVENOUS
  Filled 2016-12-13 (×5): qty 150

## 2016-12-13 MED ORDER — PIPERACILLIN-TAZOBACTAM 3.375 G IVPB 30 MIN
3.3750 g | Freq: Once | INTRAVENOUS | Status: AC
Start: 2016-12-13 — End: 2016-12-13
  Administered 2016-12-13: 3.375 g via INTRAVENOUS
  Filled 2016-12-13: qty 50

## 2016-12-13 MED ORDER — VANCOMYCIN HCL IN DEXTROSE 1-5 GM/200ML-% IV SOLN
1000.0000 mg | Freq: Once | INTRAVENOUS | Status: AC
  Administered 2016-12-13: 1000 mg via INTRAVENOUS
  Filled 2016-12-13: qty 200

## 2016-12-13 NOTE — H&P (Signed)
[place] [facility] [place] NAME: Darius [name]    MR#:  [id number]  DATE OF BIRTH:  1966/07/07  DATE OF ADMISSION:  12/13/2016  PRIMARY CARE PHYSICIAN: Pcp Not In System   REQUESTING/REFERRING PHYSICIAN: Clearnce Hasten, MD  CHIEF COMPLAINT:   Chief Complaint  Patient presents with  . Weakness    HISTORY OF PRESENT ILLNESS:  Darius [name]  is a 51 y.o. male who presents with Altered mental status. Patient's mother found him slumped over in his chair and unresponsive. Workup here in the states only elevated lactic acid and cocaine positive urine tox screen. Patient has been admitted in the past with seizures, likely induced by substance abuse. However, he does have HIV, so he was given IV antibiotics upfront for concern of possible infection. Hospitals were called for admission.  PAST MEDICAL HISTORY:   Past Medical History:  Diagnosis Date  . CKD (chronic kidney disease), stage III   . CNS lymphoma ([place])   . Drug abuse   . Hepatitis C   . HIV (human immunodeficiency virus infection) ([place])   . Hypothyroidism   . Seizures ([place])     PAST SURGICAL HISTORY:   Past Surgical History:  Procedure Laterality Date  . CRANIOTOMY      SOCIAL HISTORY:   Social History  Substance Use Topics  . Smoking status: Current Every Day Smoker  . Smokeless tobacco: Never Used  . Alcohol use No    FAMILY HISTORY:   Family History  Problem Relation Age of Onset  . Family history unknown: Yes    DRUG ALLERGIES:   Allergies  Allergen Reactions  . Asa [Aspirin]   . Sulfa Antibiotics     MEDICATIONS AT HOME:   Prior to Admission medications   Medication Sig Start Date End Date Taking? Authorizing Provider  abacavir-dolutegravir-lamiVUDine (TRIUMEQ) [id number] MG tablet Take 1 tablet by mouth daily.    Historical Provider, MD  levETIRAcetam (KEPPRA) 500 MG tablet Take 1,000 mg by mouth 2 (two) times daily.    Historical Provider, MD   levothyroxine (SYNTHROID, LEVOTHROID) 50 MCG tablet Take 50 mcg by mouth daily before breakfast.    Historical Provider, MD    REVIEW OF SYSTEMS:  Review of Systems  Unable to perform ROS: Acuity of condition     VITAL SIGNS:   Vitals:   12/13/16 2030 12/13/16 2100 12/13/16 2130 12/13/16 2300  BP: (!) 156/115 (!) 197/124 (!) 200/120 (!) 183/120  Pulse: 70 78 74 70  Resp: 11 15 12    Temp:      TempSrc:      SpO2: 97% 99% 99% 100%  Weight:      Height:       Wt Readings from Last 3 Encounters:  12/13/16 68 kg (150 lb)  04/30/16 65.8 kg (145 lb)    PHYSICAL EXAMINATION:  Physical Exam  Vitals reviewed. Constitutional: He appears well-developed and well-nourished. No distress.  HENT:  Head: Normocephalic and atraumatic.  Mouth/Throat: Oropharynx is clear and moist.  Eyes: Conjunctivae and EOM are normal. Pupils are equal, round, and reactive to light. No scleral icterus.  Neck: Normal range of motion. Neck supple. No JVD present. No thyromegaly present.  Cardiovascular: Normal rate, regular rhythm and intact distal pulses.  Exam reveals no gallop and no friction rub.   No murmur heard. Respiratory: Effort normal and breath sounds normal. No respiratory distress. He has no wheezes. He has no rales.  GI: Soft. Bowel sounds are  normal. He exhibits no distension. There is no tenderness.  Musculoskeletal: Normal range of motion. He exhibits no edema.  No arthritis, no gout  Lymphadenopathy:    He has no cervical adenopathy.  Neurological:  Unable to fully assess due to patient condition.  Patient will open eyes to verbal stimulation, but will not follow any commands  Skin: Skin is warm and dry. No rash noted. No erythema.  Psychiatric:  Unable to assess due to patient condition    LABORATORY PANEL:   CBC  Recent Labs Lab 12/13/16 2013  WBC 10.5  HGB 14.1  HCT 42.2  PLT 191    ------------------------------------------------------------------------------------------------------------------  Chemistries   Recent Labs Lab 12/13/16 2013  NA 147*  K 3.8  CL 110  CO2 27  GLUCOSE 110*  BUN 21*  CREATININE 1.56*  CALCIUM 10.0  AST 50*  ALT 27  ALKPHOS 66  BILITOT 0.6   ------------------------------------------------------------------------------------------------------------------  Cardiac Enzymes No results for input(s): TROPONINI in the last 168 hours. ------------------------------------------------------------------------------------------------------------------  RADIOLOGY:  Ct Head Wo Contrast  Result Date: 12/13/2016 CLINICAL DATA:  Acute onset of generalized weakness. Patient not speaking. Initial encounter. EXAM: CT HEAD WITHOUT CONTRAST TECHNIQUE: Contiguous axial images were obtained from the base of the skull through the vertex without intravenous contrast. COMPARISON:  CT of the head performed 04/29/2016 FINDINGS: Brain: No evidence of acute infarction, hemorrhage, hydrocephalus, extra-axial collection or mass lesion/mass effect. Prominence of the ventricles and sulci reflects moderate cortical volume loss. Mild cerebellar atrophy is noted. Scattered periventricular and subcortical white matter change likely reflects small vessel ischemic microangiopathy. Chronic encephalomalacia is noted at the left basal ganglia, and there is ex vacuo dilatation of the left lateral ventricle. Calcification is seen at the pons. No mass effect or midline shift is seen. Chronic subdural thickening is noted on the left side. Vascular: No hyperdense vessel or unexpected calcification. Skull: There is no evidence of fracture; the patient is status post left frontoparietal craniotomy. Sinuses/Orbits: The visualized portions of the orbits are within normal limits. The paranasal sinuses and mastoid air cells are well-aerated. Other: No significant soft tissue abnormalities  are seen. IMPRESSION: 1. No acute intracranial pathology seen on CT. 2. Moderate cortical volume loss and scattered small vessel ischemic microangiopathy. 3. Chronic encephalomalacia at the left basal ganglia, with ex vacuo dilatation of the left lateral ventricle. Chronic subdural thickening on the left side. Electronically Signed   By: Garald Balding M.D.   On: 12/13/2016 23:02   Dg Chest Port 1 View  Result Date: 12/13/2016 CLINICAL DATA:  Sepsis EXAM: PORTABLE CHEST 1 VIEW COMPARISON:  04/29/2014 FINDINGS: Cardiomediastinal silhouette is stable. No infiltrate or pleural effusion. No pulmonary edema. Bony thorax is unremarkable. IMPRESSION: No active disease. Electronically Signed   By: Lahoma Crocker M.D.   On: 12/13/2016 21:41    EKG:   Orders placed or performed during the hospital encounter of 12/13/16  . ED EKG 12-Lead  . ED EKG 12-Lead  . EKG 12-Lead  . EKG 12-Lead  . EKG 12-Lead  . EKG 12-Lead  . EKG 12-Lead  . EKG 12-Lead    IMPRESSION AND PLAN:  Principal Problem:   Altered mental state - unclear etiology, patient does not have any signs or symptoms of meningismus or other infection, and his current state could be consistent with a postictal state. However, exact etiology of this point is unclear. Treatment as below. Active Problems:   Drug abuse - patient is cocaine positive on urine tox screen.  Substance abuse could possibly contribute seizure   HIV (human immunodeficiency virus infection) ([place]) - infectious disease consult for assistance, IV antibiotics continued at this time   Seizures ([place]) - IV Keppra ordered, neurology consult   Hypothyroidism - home dose thyroid replacement  All the records are reviewed and case discussed with ED provider. Management plans discussed with the patient and/or family.  DVT PROPHYLAXIS: SubQ lovenox  GI PROPHYLAXIS: None  ADMISSION STATUS: Observation  CODE STATUS: Full Code Status History    Date Active Date Inactive Code Status  Order ID Comments User Context   04/30/2016 12:53 AM 04/30/2016  5:45 PM Full Code [id number]  Lance Coon, MD Inpatient      TOTAL TIME TAKING CARE OF THIS PATIENT: 40 minutes.    Louvenia Golomb Hunt 12/13/2016, 11:17 PM  [facility]  Office  [phone]  CC: Primary care physician; Pcp Not In System

## 2016-12-13 NOTE — ED Notes (Signed)
Patient taken to CT.

## 2016-12-13 NOTE — ED Provider Notes (Signed)
[facility] Emergency Department Provider Note  ____________________________________________  Time seen: Approximately 8:14 PM  I have reviewed the triage vital signs and the nursing notes.   HISTORY  Chief Complaint Weakness  Level 5 caveat:  Portions of the history and physical were unable to be obtained due to AMS   HPI Darius [name] is a 51 y.o. 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 According to EMS patient was found unresponsive and slumped over in his apartment by his mother. According to EMS he was baseline last night. Per EMS vitals BP 173/116, temp 100F. Patient is responsive to name and opens his eyes, mumbles his name when I ask him. Does not respond to any other questions. Follows commands to open his mouth when asked to have temp checked.   Past Medical History:  Diagnosis Date  . CKD (chronic kidney disease), stage III   . CNS lymphoma ([place])   . Drug abuse   . Hepatitis C   . HIV (human immunodeficiency virus infection) ([place])   . Hypothyroidism   . Seizures [facility])     Patient Active Problem List   Diagnosis Date Noted  . 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 HIV (human immunodeficiency virus infection) ([place]) 04/29/2016  . Altered mental state 04/29/2016  . Drug abuse 04/29/2016  . Hypothyroidism 04/29/2016  . Seizures ([place]) 04/29/2016    Past Surgical History:  Procedure Laterality Date  . CRANIOTOMY      Prior to Admission medications   Medication Sig Start Date End Date Taking? Authorizing Provider  abacavir-dolutegravir-lamiVUDine (TRIUMEQ) 600-50-300 MG tablet Take 1 tablet by mouth daily.   Yes Historical Provider, MD  levETIRAcetam (KEPPRA) 500 MG tablet Take 1,000 mg by mouth 2  (two) times daily.   Yes Historical Provider, MD  levothyroxine (SYNTHROID, LEVOTHROID) 50 MCG tablet Take 50 mcg by mouth daily before breakfast.   Yes Historical Provider, MD    Allergies Asa [aspirin] and Sulfa antibiotics  Family History  Problem Relation Age of Onset  . Family history unknown: Yes    Social History Social History  Substance Use Topics  . Smoking status: Current Every Day Smoker  . Smokeless tobacco: Never Used  . Alcohol use No    Review of Systems + AMS + thrush Unable to obtain remaining ____________________________________________   PHYSICAL EXAM:  VITAL SIGNS: ED Triage Vitals  Enc Vitals Group     BP 12/13/16 1956 (!) 152/116     Pulse Rate 12/13/16 1956 76     Resp 12/13/16 1956 14     Temp 12/13/16 1956 98.8 F (37.1 C)     Temp Source 12/13/16 1956 Oral     SpO2 12/13/16 1956 98 %     Weight 12/13/16 2001 150 lb (68 kg)     Height 12/13/16 2001 6' (1.829 m)     Head Circumference --      Peak Flow --      Pain Score --      Pain Loc --      Pain Edu? --      Excl. in [place]? --     Constitutional: Opens eyes when I call his name, will mumble his first name Vu when asked, moves all extremities, no distress. HEENT:  Head: Normocephalic and atraumatic.         Eyes: Conjunctivae are normal. Sclera is non-icteric. EOMI. PERRL      Mouth/Throat: Mucous membranes are dry. Significant amount of thrush seen on his tongue and oropharynx       Neck: Supple with no signs of meningismus. Cardiovascular: Regular rate and rhythm. No murmurs, gallops, or rubs. 2+ symmetrical distal pulses are present in all extremities. No JVD. Respiratory: Shallow breathing, no wheezes or crackles appreciated. Gastrointestinal: Soft, non tender, and non distended with positive bowel sounds. No rebound or guarding. Musculoskeletal: No edema, cyanosis, or erythema of extremities. Neurologic: Pupils 3 mm and reactive, answers to his name and follows simple  commands such as to open and close his mouth to take his temperature. Moving all 4 extremities Skin: Skin is very dry. Bugs seen in linens  ____________________________________________   LABS (all labs ordered are listed, but only abnormal results are displayed)  Labs Reviewed  URINE CULTURE - Abnormal; Notable for the following:       Result Value   Culture   (*)    Value: >=100,000 COLONIES/mL METHICILLIN RESISTANT STAPHYLOCOCCUS AUREUS   Organism ID, Bacteria METHICILLIN RESISTANT STAPHYLOCOCCUS AUREUS (*)    All other components within normal limits  COMPREHENSIVE METABOLIC PANEL - Abnormal; Notable for the following:    Sodium 147 (*)    Glucose, Bld 110 (*)    BUN 21 (*)    Creatinine, Ser 1.56 (*)    Total Protein 10.5 (*)    AST 50 (*)    GFR calc non Af Amer 51 (*)    GFR calc Af Amer 59 (*)    All other components within normal limits  CBC WITH DIFFERENTIAL/PLATELET - Abnormal; Notable for the following:    RBC 4.22 (*)    RDW 15.3 (*)    Neutro Abs 7.5 (*)    Monocytes Absolute 1.2 (*)    All other components within normal limits  LACTIC ACID, PLASMA - Abnormal; Notable for the following:    Lactic Acid, Venous 2.0 (*)    All other components within normal limits  LACTIC ACID, PLASMA - Abnormal; Notable for the following:    Lactic Acid, Venous 3.0 (*)    All other components within normal limits  LIPASE, BLOOD - Abnormal; Notable for the following:    Lipase <10 (*)    All other components within normal limits  URINALYSIS, COMPLETE (UACMP) WITH MICROSCOPIC - Abnormal; Notable for the following:    Color, Urine YELLOW (*)    APPearance CLEAR (*)    Protein, ur 100 (*)    All other components within normal limits  URINE DRUG SCREEN, QUALITATIVE ([facility] ONLY) - Abnormal; Notable for the following:    Cocaine Metabolite,Ur Roland POSITIVE (*)    All other components within normal limits  AMMONIA - Abnormal; Notable for the following:    Ammonia 37 (*)    All other  components within normal limits  BASIC METABOLIC PANEL - Abnormal; Notable for the following:    Sodium 147 (*)    Chloride 112 (*)    Creatinine, Ser 1.47 (*)    GFR calc non Af Amer 55 (*)    All other components within normal limits  CBC - Abnormal; Notable for the following:    WBC 11.1 (*)    RBC 4.14 (*)    RDW 15.0 (*)    All other components within normal limits  HELPER T-LYMPH-CD4 ([facility] ONLY) -  Abnormal; Notable for the following:    % CD 4 Pos. Lymph. 26.8 (*)    MCV 100 (*)    Monocytes Absolute 1.2 (*)    All other components within normal limits  VANCOMYCIN, TROUGH - Abnormal; Notable for the following:    Vancomycin Tr 14 (*)    All other components within normal limits  PROTIME-INR - Abnormal; Notable for the following:    Prothrombin Time 15.6 (*)    All other components within normal limits  APTT - Abnormal; Notable for the following:    aPTT 39 (*)    All other components within normal limits  CSF CELL COUNT WITH DIFFERENTIAL - Abnormal; Notable for the following:    RBC Count, CSF 585 (*)    All other components within normal limits  PROTEIN AND GLUCOSE, CSF - Abnormal; Notable for the following:    Total  Protein, CSF 91 (*)    All other components within normal limits  CREATININE, SERUM - Abnormal; Notable for the following:    Creatinine, Ser 1.28 (*)    All other components within normal limits  CULTURE, BLOOD (ROUTINE X 2)  CULTURE, BLOOD (ROUTINE X 2)  CSF CULTURE  CULTURE, FUNGUS WITHOUT SMEAR  INFLUENZA PANEL BY PCR (TYPE A & B)  ETHANOL  VDRL, CSF  RPR  CRYPTOCOCCAL ANTIGEN, CSF  TSH  CRYPTOCOCCUS ANTIGEN, SERUM  HIV 1 RNA QUANT-NO REFLEX-BLD  VANCOMYCIN, TROUGH  HERPES SIMPLEX VIRUS(HSV) DNA BY PCR   ____________________________________________  EKG  ED ECG REPORT I, Rudene Re, the attending physician, personally viewed and interpreted this ECG.  Normal sinus rhythm, rate of 79, prolonged QTC, normal axis, poor baseline due  to significant amount of noise as patient is shaking, no ST elevations or depressions. ____________________________________________  RADIOLOGY  CXR: PND  CT head: PND ____________________________________________   PROCEDURES  Procedure(s) performed: yes Procedures   US guided IV placed in the R AC with good blood drawback and easy flow. Patient tolerated with no complications  Critical Care performed:  None ____________________________________________   INITIAL IMPRESSION / ASSESSMENT AND PLAN / ED COURSE  51 y.o. 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 According to EMS patient was seen normal last night by his mother. Mother is not at the bedside. No emergency contact available. Patient will open his eyes and answer to his name, will follow simple commands such as open and close his mouth to get his temperature checked. His vital signs are within normal limits, he is not in any distress, his pupils are equal round and reactive 3 mm, no meningeal signs, abdomen is soft, lungs are clear. Patient has significant amount of thrush in his mouth. Patient is moving all 4 extremities. Broad differential diagnoses including postictal, infection, electrolyte abnormalities, sputum dehydration, intracranial etiology such as bleed, mass, infection, or recurrence of his lymphoma. Will start patient on IV fluids, broad-spectrum antibiotics. Will get EKG, labs, head CT, drug screen. Care transferred to Dr. Clearnce Hasten.     Pertinent labs & imaging results that were available during my care of the patient were reviewed by me and considered in my medical decision making (see chart for details).    ____________________________________________   FINAL CLINICAL  IMPRESSION(S) / ED DIAGNOSES  Final diagnoses:  Acute encephalopathy  NEW MEDICATIONS STARTED DURING THIS VISIT:  Current Discharge Medication List       Note:  This document was prepared using Dragon voice recognition software and may include unintentional dictation errors.    Rudene Re, MD 12/17/16 1106

## 2016-12-13 NOTE — ED Triage Notes (Signed)
Pt to ED via EMS from home c/o weakness.  Per EMS patient found by mother today slumped in chair.  Patient responsive to name but not speaking.  Patient was at baseline last night.  Hx of HIV, hepatitis,  EMS vitals 173/116 BP, 100 temp.   Pt presents responsive to name opening eyes with some head nods but not speaking.

## 2016-12-13 NOTE — Progress Notes (Signed)
Pharmacy Antibiotic Note  Darius [name] is a 51 y.o. male admitted on 12/13/2016 with sepsis.  Pharmacy has been consulted for vancomycin/zosyn dosing.  Plan: Patient received vancomycin 1g IV x 1 in ED Will start vancomycin 750 mg q12h 3/28 @ 0430. Will check a vanc trough 3/29 @ 0330 prior to 3rd dose to ensure clearance of drug Goal trough 15 - 20 mcg/mL Will start zosyn 3.375g IV q8h per extended infusion. Will continue to monitor renal function.  Height: 6' (182.9 cm) Weight: 150 lb (68 kg) IBW/kg (Calculated) : 77.6  Temp (24hrs), Avg:98.8 F (37.1 C), Min:98.8 F (37.1 C), Max:98.8 F (37.1 C)   Recent Labs Lab 12/13/16 2013 12/13/16 2057  WBC 10.5  --   CREATININE 1.56*  --   LATICACIDVEN  --  3.0*    Estimated Creatinine Clearance: 55.7 mL/min (A) (by C-G formula based on SCr of 1.56 mg/dL (H)).    Allergies  Allergen Reactions  . Asa [Aspirin]   . Sulfa Antibiotics     Thank you for allowing pharmacy to be a part of this patient's care.  Tobie Lords, PharmD, BCPS Clinical Pharmacist 12/13/2016

## 2016-12-13 NOTE — ED Provider Notes (Signed)
Patient with a history of polysubstance abuse presents with altered mental status. Seizure versus sepsis versus polysubstance abuse. Less likely meningitis. No meningismus or meningeal signs on exam initially.  Physical Exam  BP (!) 183/120   Pulse 70   Temp 98.8 F (37.1 C) (Oral)   Resp 12   Ht 6' (1.829 m)   Wt 150 lb (68 kg)   SpO2 100%   BMI 20.34 kg/m  ----------------------------------------- 11:47 PM on 12/13/2016 -----------------------------------------   Physical Exam Patient awake and alert and responding to name. However, still not verbally responsive. ED Course  Procedures  MDM We'll give him broad-spectrum antibiotics. Initial lactic acid of 3. Sepsis versus post seizure. Signed out to Dr. Jannifer Franklin of the medicine service.       Orbie Pyo, MD 12/13/16 [phone]

## 2016-12-14 ENCOUNTER — Encounter: Payer: Self-pay | Admitting: Neurology

## 2016-12-14 ENCOUNTER — Observation Stay: Payer: Medicare Other

## 2016-12-14 DIAGNOSIS — R4182 Altered mental status, unspecified: Secondary | ICD-10-CM

## 2016-12-14 DIAGNOSIS — R410 Disorientation, unspecified: Secondary | ICD-10-CM

## 2016-12-14 LAB — BASIC METABOLIC PANEL
Anion gap: 11 (ref 5–15)
BUN: 19 mg/dL (ref 6–20)
CHLORIDE: 112 mmol/L — AB (ref 101–111)
CO2: 24 mmol/L (ref 22–32)
CREATININE: 1.47 mg/dL — AB (ref 0.61–1.24)
Calcium: 9.4 mg/dL (ref 8.9–10.3)
GFR calc Af Amer: >60 mL/min (ref >60)
GFR calc non Af Amer: 55 mL/min — ABNORMAL LOW (ref >60)
Glucose, Bld: 91 mg/dL (ref 65–99)
Potassium: 3.8 mmol/L (ref 3.5–5.1)
SODIUM: 147 mmol/L — AB (ref 135–145)

## 2016-12-14 LAB — CBC
HCT: 41.4 % (ref 40.0–52.0)
Hemoglobin: 13.4 g/dL (ref 13.0–18.0)
MCH: 32.4 pg (ref 26.0–34.0)
MCHC: 32.5 g/dL (ref 32.0–36.0)
MCV: 99.9 fL (ref 80.0–100.0)
PLATELETS: 157 10*3/uL (ref 150–440)
RBC: 4.14 MIL/uL — ABNORMAL LOW (ref 4.40–5.90)
RDW: 15 % — AB (ref 11.5–14.5)
WBC: 11.1 10*3/uL — ABNORMAL HIGH (ref 3.8–10.6)

## 2016-12-14 LAB — TSH: TSH: 1.468 u[IU]/mL (ref 0.350–4.500)

## 2016-12-14 LAB — LACTIC ACID, PLASMA: LACTIC ACID, VENOUS: 2 mmol/L — AB (ref 0.5–1.9)

## 2016-12-14 MED ORDER — CEFTRIAXONE SODIUM 2 G IJ SOLR
2.0000 g | Freq: Two times a day (BID) | INTRAMUSCULAR | Status: DC
  Administered 2016-12-14 – 2016-12-19 (×11): 2 g via INTRAVENOUS
  Filled 2016-12-14 (×13): qty 2

## 2016-12-14 MED ORDER — ENOXAPARIN SODIUM 40 MG/0.4ML ~~LOC~~ SOLN
40.0000 mg | SUBCUTANEOUS | Status: DC
  Administered 2016-12-14: 40 mg via SUBCUTANEOUS
  Filled 2016-12-14: qty 0.4

## 2016-12-14 MED ORDER — ACETAMINOPHEN 325 MG PO TABS: 650.0000 mg | ORAL_TABLET | Freq: Four times a day (QID) | ORAL | Status: DC | PRN

## 2016-12-14 MED ORDER — ONDANSETRON HCL 4 MG/2ML IJ SOLN
4.0000 mg | Freq: Four times a day (QID) | INTRAMUSCULAR | Status: DC | PRN
  Filled 2016-12-14: qty 2

## 2016-12-14 MED ORDER — CHLORHEXIDINE GLUCONATE 0.12 % MT SOLN
15.0000 mL | Freq: Two times a day (BID) | OROMUCOSAL | Status: DC
  Administered 2016-12-14 – 2016-12-29 (×28): 15 mL via OROMUCOSAL
  Filled 2016-12-14 (×18): qty 15

## 2016-12-14 MED ORDER — SODIUM CHLORIDE 0.9 % IV SOLN
INTRAVENOUS | Status: AC
Start: 2016-12-14 — End: 2016-12-14
  Administered 2016-12-14: 02:00:00 via INTRAVENOUS

## 2016-12-14 MED ORDER — ORAL CARE MOUTH RINSE
15.0000 mL | Freq: Two times a day (BID) | OROMUCOSAL | Status: DC
  Administered 2016-12-16 – 2016-12-28 (×13): 15 mL via OROMUCOSAL

## 2016-12-14 MED ORDER — ABACAVIR-DOLUTEGRAVIR-LAMIVUD 600-50-300 MG PO TABS
1.0000 | ORAL_TABLET | Freq: Every day | ORAL | Status: DC
  Administered 2016-12-23 – 2016-12-29 (×4): 1 via ORAL
  Filled 2016-12-14 (×16): qty 1

## 2016-12-14 MED ORDER — ACETAMINOPHEN 650 MG RE SUPP
650.0000 mg | Freq: Four times a day (QID) | RECTAL | Status: DC | PRN
  Administered 2016-12-14 (×2): 650 mg via RECTAL
  Filled 2016-12-14 (×2): qty 1

## 2016-12-14 MED ORDER — SODIUM CHLORIDE 0.9 % IV SOLN
1000.0000 mg | Freq: Two times a day (BID) | INTRAVENOUS | Status: DC
  Administered 2016-12-14 – 2016-12-17 (×7): 1000 mg via INTRAVENOUS
  Filled 2016-12-14 (×10): qty 10

## 2016-12-14 MED ORDER — LEVOTHYROXINE SODIUM 50 MCG PO TABS
50.0000 ug | ORAL_TABLET | Freq: Every day | ORAL | Status: DC
  Filled 2016-12-14 (×2): qty 1

## 2016-12-14 MED ORDER — ONDANSETRON HCL 4 MG PO TABS: 4.0000 mg | ORAL_TABLET | Freq: Four times a day (QID) | ORAL | Status: DC | PRN

## 2016-12-14 NOTE — Progress Notes (Signed)
[place] at [facility]                                                                                                                                                                                  Patient Demographics   Darius [name], is a 51 y.o. male, DOB - March 16, 1966, GHW:[id number]  Admit date - 12/13/2016   Admitting Physician Lance Coon, MD  Outpatient Primary MD for the patient is Pcp Not In System   LOS - 0  Subjective: Patient admitted with acute encephalopathy. He is awake but still very confused. And not able to communicate much. His mother is at bedside. His mother reports that patient is disabled and lives at home alone but normally he is awake alert and communicative. He does have HIV and she states that last was seen and [facility]. His viral load was undetectable. According to her he is been taking his meds.    Review of Systems:   CONSTITUTIONAL: Unable to provide  Vitals:   Vitals:   12/14/16 0156 12/14/16 0358 12/14/16 0834 12/14/16 1024  BP: (!) 168/118  (!) 165/98   Pulse: 86  85   Resp:   16   Temp: (!) 101 F (38.3 C) (!) 100.5 F (38.1 C) 100.3 F (37.9 C) 100.2 F (37.9 C)  TempSrc: Axillary Axillary Axillary Axillary  SpO2: 100%  98%   Weight: 141 lb 8 oz (64.2 kg)     Height:        Wt Readings from Last 3 Encounters:  12/14/16 141 lb 8 oz (64.2 kg)  04/30/16 145 lb (65.8 kg)     Intake/Output Summary (Last 24 hours) at 12/14/16 1342 Last data filed at 12/14/16 0530  Gross per 24 hour  Intake              965 ml  Output                0 ml  Net              965 ml    Physical Exam:   GENERAL: Pleasant-appearing in no apparent distress. He is confused HEAD, EYES, EARS, NOSE AND THROAT: Atraumatic, normocephalic. . Pupils equal and reactive to light. Sclerae anicteric. No conjunctival injection. No oro-pharyngeal erythema.  NECK: Supple. There is no jugular venous distention. No  bruits, no lymphadenopathy, no thyromegaly.  HEART: Regular rate and rhythm,. No murmurs, no rubs, no clicks.  LUNGS: Clear to auscultation bilaterally. No rales or rhonchi. No wheezes.  ABDOMEN: Soft, flat, nontender, nondistended. Has good bowel sounds. No hepatosplenomegaly appreciated.  EXTREMITIES: No evidence of any cyanosis, clubbing, or  peripheral edema.  +2 pedal and radial pulses bilaterally.  NEUROLOGIC: The patient is alert, not oriented to place person or time no focal deficit is noted however patient unable to provide complete examination SKIN: Moist and warm with no rashes appreciated.  Psych: Not anxious, depressed LN: No inguinal LN enlargement    Antibiotics   Anti-infectives    Start     Dose/Rate Route Frequency Ordered Stop   12/14/16 1000  abacavir-dolutegravir-lamiVUDine (TRIUMEQ) 600-50-300 MG per tablet 1 tablet     1 tablet Oral Daily 12/14/16 0155     12/14/16 0600  piperacillin-tazobactam (ZOSYN) IVPB 3.375 g     3.375 g 12.5 mL/hr over 240 Minutes Intravenous Every 8 hours 12/13/16 2232     12/14/16 0430  vancomycin (VANCOCIN) IVPB 750 mg/150 ml premix     750 mg 150 mL/hr over 60 Minutes Intravenous Every 12 hours 12/13/16 2232     12/13/16 2215  piperacillin-tazobactam (ZOSYN) IVPB 3.375 g     3.375 g 100 mL/hr over 30 Minutes Intravenous  Once 12/13/16 2212 12/13/16 2310   12/13/16 2215  vancomycin (VANCOCIN) IVPB 1000 mg/200 mL premix     1,000 mg 200 mL/hr over 60 Minutes Intravenous  Once 12/13/16 2212 12/13/16 2325      Medications   Scheduled Meds: . abacavir-dolutegravir-lamiVUDine  1 tablet Oral Daily  . chlorhexidine  15 mL Mouth Rinse BID  . levETIRAcetam  1,000 mg Intravenous Q12H  . levothyroxine  50 mcg Oral QAC breakfast  . mouth rinse  15 mL Mouth Rinse q12n4p  . piperacillin-tazobactam (ZOSYN)  IV  3.375 g Intravenous Q8H  . vancomycin  750 mg Intravenous Q12H   Continuous Infusions: PRN Meds:.acetaminophen **OR**  acetaminophen, ondansetron **OR** ondansetron (ZOFRAN) IV   Data Review:   Micro Results Recent Results (from the past 240 hour(s))  Blood Culture (routine x 2)     Status: None (Preliminary result)   Collection Time: 12/13/16  8:13 PM  Result Value Ref Range Status   Specimen Description BLOOD R ARM  Final   Special Requests BOTTLES DRAWN AEROBIC AND ANAEROBIC BCHV  Final   Culture NO GROWTH < 12 HOURS  Final   Report Status PENDING  Incomplete  Blood Culture (routine x 2)     Status: None (Preliminary result)   Collection Time: 12/13/16 10:24 PM  Result Value Ref Range Status   Specimen Description BLOOD  R ARM  Final   Special Requests BOTTLES DRAWN AEROBIC AND ANAEROBIC  BCAV  Final   Culture NO GROWTH < 12 HOURS  Final   Report Status PENDING  Incomplete    Radiology Reports Ct Head Wo Contrast  Result Date: 12/13/2016 CLINICAL DATA:  Acute onset of generalized weakness. Patient not speaking. Initial encounter. EXAM: CT HEAD WITHOUT CONTRAST TECHNIQUE: Contiguous axial images were obtained from the base of the skull through the vertex without intravenous contrast. COMPARISON:  CT of the head performed 04/29/2016 FINDINGS: Brain: No evidence of acute infarction, hemorrhage, hydrocephalus, extra-axial collection or mass lesion/mass effect. Prominence of the ventricles and sulci reflects moderate cortical volume loss. Mild cerebellar atrophy is noted. Scattered periventricular and subcortical white matter change likely reflects small vessel ischemic microangiopathy. Chronic encephalomalacia is noted at the left basal ganglia, and there is ex vacuo dilatation of the left lateral ventricle. Calcification is seen at the pons. No mass effect or midline shift is seen. Chronic subdural thickening is noted on the left side. Vascular: No hyperdense vessel or unexpected calcification.  Skull: There is no evidence of fracture; the patient is status post left frontoparietal craniotomy.  Sinuses/Orbits: The visualized portions of the orbits are within normal limits. The paranasal sinuses and mastoid air cells are well-aerated. Other: No significant soft tissue abnormalities are seen. IMPRESSION: 1. No acute intracranial pathology seen on CT. 2. Moderate cortical volume loss and scattered small vessel ischemic microangiopathy. 3. Chronic encephalomalacia at the left basal ganglia, with ex vacuo dilatation of the left lateral ventricle. Chronic subdural thickening on the left side. Electronically Signed   By: Garald Balding M.D.   On: 12/13/2016 23:02   Dg Chest Port 1 View  Result Date: 12/13/2016 CLINICAL DATA:  Sepsis EXAM: PORTABLE CHEST 1 VIEW COMPARISON:  04/29/2014 FINDINGS: Cardiomediastinal silhouette is stable. No infiltrate or pleural effusion. No pulmonary edema. Bony thorax is unremarkable. IMPRESSION: No active disease. Electronically Signed   By: Lahoma Crocker M.D.   On: 12/13/2016 21:41     CBC  Recent Labs Lab 12/13/16 2013 12/14/16 0923  WBC 10.5 11.1*  HGB 14.1 13.4  HCT 42.2 41.4  PLT 191 157  MCV 99.9 99.9  MCH 33.5 32.4  MCHC 33.5 32.5  RDW 15.3* 15.0*  LYMPHSABS 1.7  --   MONOABS 1.2*  --   EOSABS 0.0  --   BASOSABS 0.0  --     Chemistries   Recent Labs Lab 12/13/16 2013 12/14/16 0923  NA 147* 147*  K 3.8 3.8  CL 110 112*  CO2 27 24  GLUCOSE 110* 91  BUN 21* 19  CREATININE 1.56* 1.47*  CALCIUM 10.0 9.4  AST 50*  --   ALT 27  --   ALKPHOS 66  --   BILITOT 0.6  --    ------------------------------------------------------------------------------------------------------------------ estimated creatinine clearance is 55.8 mL/min (A) (by C-G formula based on SCr of 1.47 mg/dL (H)). ------------------------------------------------------------------------------------------------------------------ No results for input(s): HGBA1C in the last 72  hours. ------------------------------------------------------------------------------------------------------------------ No results for input(s): CHOL, HDL, LDLCALC, TRIG, CHOLHDL, LDLDIRECT in the last 72 hours. ------------------------------------------------------------------------------------------------------------------ No results for input(s): TSH, T4TOTAL, T3FREE, THYROIDAB in the last 72 hours.  Invalid input(s): FREET3 ------------------------------------------------------------------------------------------------------------------ No results for input(s): VITAMINB12, FOLATE, FERRITIN, TIBC, IRON, RETICCTPCT in the last 72 hours.  Coagulation profile No results for input(s): INR, PROTIME in the last 168 hours.  No results for input(s): DDIMER in the last 72 hours.  Cardiac Enzymes No results for input(s): CKMB, TROPONINI, MYOGLOBIN in the last 168 hours.  Invalid input(s): CK ------------------------------------------------------------------------------------------------------------------ Invalid input(s): [place]  Patient is a 51 year old with HIV with acute mental status changes 1. Acute encephalopathy: Differential include infectious or drug-induced I have discussed the case with infectious disease, I will order a lumbar puncture with studies for meningitis Patient also may have encephalitis may need a MRI and EEG has been ordered Neurology consult pending Infectious disease will see the patient Ammonia is slightly abnormal not the cause for his encephalopathy  2. History of seizure disorder currently on IV Keppra  3. Hypothyroidism continue Synthroid  4. HIV continue HIV meds I will order CD4 count  5. Miscellaneous due to his lumbar puncture I'll discontinue Lovenox       Code Status Orders        Start     Ordered   12/14/16 0156  Full code  Continuous     12/14/16 0155    Code Status History    Date Active Date Inactive Code  Status Order ID Comments User Context  04/30/2016 12:53 AM 04/30/2016  5:45 PM Full Code [id number]  Lance Coon, MD Inpatient        * Discussed the case with his mother was at bedside as well as infectious disease doctor Ricky Stabs  Id ,neuro DVT Prophylaxis  scd's   Lab Results  Component Value Date   PLT 157 12/14/2016     Time Spent in minutes   32min  Greater than 50% of time spent in care coordination and counseling patient regarding the condition and plan of care.   Dustin Flock M.D on 12/14/2016 at 1:42 PM  Between 7am to 6pm - Pager - [phone]  After 6pm go to [email or web address] - password [email or web address]   Office  [phone]

## 2016-12-14 NOTE — Plan of Care (Signed)
Problem: Education: Goal: Knowledge of [facility] Education information/materials will improve Outcome: Not Progressing Variance: Physical/mental limitations   Problem: Safety: Goal: Ability to remain free from injury will improve Outcome: Not Progressing Variance: Physical/mental limitations

## 2016-12-14 NOTE — ED Notes (Signed)
Pt transport to 152 

## 2016-12-14 NOTE — Consult Note (Signed)
[facility] Infectious Disease     Reason for Consult: HIV, AMS, fever    Referring Physician: Dustin Flock Date of Admission:  12/13/2016   Principal Problem:   Altered mental state Active Problems:   HIV (human immunodeficiency virus infection) ([place])   Drug abuse   Hypothyroidism   Seizures ([place])   HPI: Darius [name] is a 51 y.o. 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 He was found slumped over in his chair and unresponsive. On admit wbc 10, temp initially 98 then up to 101. Flu PCR neg, UA neg, Utox + cocaine, CT head neg for acute changes, CXR neg. He was started on IV vanco and zosyn.  His mother is at bedside and provides his history as he remains altered.  She reports last sen nml Monday afternoon. Normally he stays home by himself (lives with his mother and his son but both work) and can get around and do own ADLs. Mother fills his pill box but he can take his meds himself and she reports pill boxes have been empty when she checks.  Currently she reports he is a little more awake but very confused, keeps trying to get oob, not talking much.   History from [facility] charge 1. Central nervous system lymphoma, presumptive dx 3/06  -s/p whole brain XRT and Rituxan, 2006  2. HIV, dx 52  -h/o crypto meningitis, 2000  -h/o PCP, 2006  - Combivir/Kaletra 2006-2010  - Raltegravir/Truvada 2010- present  3. Seizures, 2/2 PCNSL  4. Hypothyroidism  5. Hepatitis C  -genotype 1A  -liver bx with chronic active hepatitis (grade 1/4) and fibrosis (grade 1-2/4), 10/07  - treated Aug 2015 with SVR  6. Subdural hematoma requiring evacuation, 07/2008  7. 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  8. Seizure disorder, NOS  9. Chronic Renal Insufficiency (Stage III)  Cr 03/2014 1.8        Past Medical History:  Diagnosis Date  . CKD (chronic kidney disease), stage III   . CNS lymphoma ([place])   . Drug abuse   . Hepatitis C   . HIV (human immunodeficiency virus infection) ([place])   . Hypothyroidism   . Seizures ([place])    Past Surgical History:  Procedure Laterality Date  . CRANIOTOMY     Social History  Substance Use Topics  . Smoking status: Current Every Day Smoker  . Smokeless tobacco: Never Used  . Alcohol use No   Family History  Problem Relation Age of Onset  . Family history unknown: Yes    Allergies:  Allergies  Allergen Reactions  . Asa [Aspirin] Other (See Comments)    Reaction: unknown  . Sulfa Antibiotics Other (See Comments)    Reaction: unknown    Current antibiotics: Antibiotics Given (last 72 hours)    Date/Time Action Medication Dose Rate   12/14/16 0314 Given   vancomycin (VANCOCIN) IVPB 750 mg/150 ml premix 750 mg 150 mL/hr   12/14/16 0454 Given   piperacillin-tazobactam (ZOSYN) IVPB 3.375 g 3.375 g 12.5 mL/hr   12/14/16 1303 Given   piperacillin-tazobactam (ZOSYN) IVPB 3.375 g 3.375 g 12.5 mL/hr      MEDICATIONS: . abacavir-dolutegravir-lamiVUDine  1 tablet Oral Daily  . chlorhexidine  15 mL Mouth Rinse BID  . levETIRAcetam  1,000 mg Intravenous  Q12H  . levothyroxine  50 mcg Oral QAC breakfast  . mouth rinse  15 mL Mouth Rinse q12n4p  . piperacillin-tazobactam (ZOSYN)  IV  3.375 g Intravenous Q8H  . vancomycin  750 mg Intravenous Q12H    Review of Systems - unable to obtain   OBJECTIVE: Temp:  [98.8 F (37.1 C)-101 F (38.3 C)] 100.2 F (37.9 C) (03/28 1024) Pulse Rate:  [70-90] 85 (03/28 0834) Resp:  [11-16] 16 (03/28 0834) BP: (152-200)/(92-124) 165/98 (03/28 0834) SpO2:  [96 %-100 %] 98 % (03/28 0834) Weight:  [64.2 kg (141 lb 8 oz)-68 kg (150 lb)] 64.2 kg (141 lb 8 oz) (03/28 0156) Physical Exam  Constitutional: chronically ill appearing, dishelved, trying to get oob, non verbal.  HENT: anicteric,  pupils somewhat dilated but reactive, not following or tracking Mouth/Throat: Oropharynx is very dry, thick white coating on tongue .  Cardiovascular: Normal rate, regular rhythm and normal heart sounds.  Pulmonary/Chest: Effort normal and breath sounds normal. No respiratory distress. He has no wheezes.  Abdominal: Soft. Bowel sounds are normal. He exhibits no distension. There is no tenderness.  Lymphadenopathy: He has no cervical adenopathy.  Neurological: eyes open and he will follows simple command, like open mouth but not really responsive. moving all 4 extremities spontaneously, trying to get oob Skin: Skin is warm and dry. No rash noted. No erythema.  Psychiatric:confused  LABS: Results for orders placed or performed during the hospital encounter of 12/13/16 (from the past 48 hour(s))  Comprehensive metabolic panel     Status: Abnormal   Collection Time: 12/13/16  8:13 PM  Result Value Ref Range   Sodium 147 (H) 135 - 145 mmol/L   Potassium 3.8 3.5 - 5.1 mmol/L   Chloride 110 101 - 111 mmol/L   CO2 27 22 - 32 mmol/L   Glucose, Bld 110 (H) 65 - 99 mg/dL   BUN 21 (H) 6 - 20 mg/dL   Creatinine, Ser 1.56 (H) 0.61 - 1.24 mg/dL   Calcium 10.0 8.9 - 10.3 mg/dL   Total Protein 10.5 (H) 6.5 - 8.1 g/dL   Albumin 4.9 3.5 - 5.0 g/dL   AST 50 (H) 15 - 41 U/L   ALT 27 17 - 63 U/L   Alkaline Phosphatase 66 38 - 126 U/L   Total Bilirubin 0.6 0.3 - 1.2 mg/dL   GFR calc non Af Amer 51 (L) >60 mL/min   GFR calc Af Amer 59 (L) >60 mL/min    Comment: (NOTE) The eGFR has been calculated using the CKD EPI equation. This calculation has not been validated in all clinical situations. eGFR's persistently <60 mL/min signify possible Chronic Kidney Disease.    Anion gap 10 5 - 15  CBC WITH DIFFERENTIAL     Status: Abnormal   Collection Time: 12/13/16  8:13 PM  Result Value Ref Range   WBC 10.5 3.8 - 10.6 K/uL   RBC 4.22 (L) 4.40 - 5.90 MIL/uL   Hemoglobin 14.1 13.0 - 18.0 g/dL   HCT 42.2 40.0  - 52.0 %   MCV 99.9 80.0 - 100.0 fL   MCH 33.5 26.0 - 34.0 pg   MCHC 33.5 32.0 - 36.0 g/dL   RDW 15.3 (H) 11.5 - 14.5 %   Platelets 191 150 - 440 K/uL   Neutrophils Relative % 72 %   Neutro Abs 7.5 (H) 1.4 - 6.5 K/uL   Lymphocytes Relative 17 %   Lymphs Abs 1.7 1.0 - 3.6 K/uL   Monocytes Relative  11 %   Monocytes Absolute 1.2 (H) 0.2 - 1.0 K/uL   Eosinophils Relative 0 %   Eosinophils Absolute 0.0 0 - 0.7 K/uL   Basophils Relative 0 %   Basophils Absolute 0.0 0 - 0.1 K/uL  Blood Culture (routine x 2)     Status: None (Preliminary result)   Collection Time: 12/13/16  8:13 PM  Result Value Ref Range   Specimen Description BLOOD R ARM    Special Requests BOTTLES DRAWN AEROBIC AND ANAEROBIC BCHV    Culture NO GROWTH < 12 HOURS    Report Status PENDING   Lipase, blood     Status: Abnormal   Collection Time: 12/13/16  8:13 PM  Result Value Ref Range   Lipase <10 (L) 11 - 51 U/L  Urinalysis, Complete w Microscopic     Status: Abnormal   Collection Time: 12/13/16  8:13 PM  Result Value Ref Range   Color, Urine YELLOW (A) YELLOW   APPearance CLEAR (A) CLEAR   Specific Gravity, Urine 1.028 1.005 - 1.030   pH 5.0 5.0 - 8.0   Glucose, UA NEGATIVE NEGATIVE mg/dL   Hgb urine dipstick NEGATIVE NEGATIVE   Bilirubin Urine NEGATIVE NEGATIVE   Ketones, ur NEGATIVE NEGATIVE mg/dL   Protein, ur 100 (A) NEGATIVE mg/dL   Nitrite NEGATIVE NEGATIVE   Leukocytes, UA NEGATIVE NEGATIVE   RBC / HPF 0-5 0 - 5 RBC/hpf   WBC, UA NONE SEEN 0 - 5 WBC/hpf   Bacteria, UA NONE SEEN NONE SEEN   Squamous Epithelial / LPF NONE SEEN NONE SEEN  Influenza panel by PCR (type A & B)     Status: None   Collection Time: 12/13/16  8:13 PM  Result Value Ref Range   Influenza A By PCR NEGATIVE NEGATIVE   Influenza B By PCR NEGATIVE NEGATIVE    Comment: (NOTE) The Xpert Xpress Flu assay is intended as an aid in the diagnosis of  influenza and should not be used as a sole basis for treatment.  This  assay is FDA  approved for nasopharyngeal swab specimens only. Nasal  washings and aspirates are unacceptable for Xpert Xpress Flu testing.   Urine Drug Screen, Qualitative ([place] only)     Status: Abnormal   Collection Time: 12/13/16  8:13 PM  Result Value Ref Range   Tricyclic, Ur Screen NONE DETECTED NONE DETECTED   Amphetamines, Ur Screen NONE DETECTED NONE DETECTED   MDMA (Ecstasy)Ur Screen NONE DETECTED NONE DETECTED   Cocaine Metabolite,Ur [place] POSITIVE (A) NONE DETECTED   Opiate, Ur Screen NONE DETECTED NONE DETECTED   Phencyclidine (PCP) Ur S NONE DETECTED NONE DETECTED   Cannabinoid 50 Ng, Ur  NONE DETECTED NONE DETECTED   Barbiturates, Ur Screen NONE DETECTED NONE DETECTED   Benzodiazepine, Ur Scrn NONE DETECTED NONE DETECTED   Methadone Scn, Ur NONE DETECTED NONE DETECTED    Comment: (NOTE) 161  Tricyclics, urine               Cutoff 1000 ng/mL 200  Amphetamines, urine             Cutoff 1000 ng/mL 300  MDMA (Ecstasy), urine           Cutoff 500 ng/mL 400  Cocaine Metabolite, urine       Cutoff 300 ng/mL 500  Opiate, urine                   Cutoff 300 ng/mL 600  Phencyclidine (PCP), urine  Cutoff 25 ng/mL 700  Cannabinoid, urine              Cutoff 50 ng/mL 800  Barbiturates, urine             Cutoff 200 ng/mL 900  Benzodiazepine, urine           Cutoff 200 ng/mL 1000 Methadone, urine                Cutoff 300 ng/mL 1100 1200 The urine drug screen provides only a preliminary, unconfirmed 1300 analytical test result and should not be used for non-medical 1400 purposes. Clinical consideration and professional judgment should 1500 be applied to any positive drug screen result due to possible 1600 interfering substances. A more specific alternate chemical method 1700 must be used in order to obtain a confirmed analytical result.  1800 Gas chromato graphy / mass spectrometry (GC/MS) is the preferred 1900 confirmatory method.   Ethanol     Status: None   Collection Time: 12/13/16   8:13 PM  Result Value Ref Range   Alcohol, Ethyl (B) <5 <5 mg/dL    Comment:        LOWEST DETECTABLE LIMIT FOR SERUM ALCOHOL IS 5 mg/dL FOR MEDICAL PURPOSES ONLY   Lactic acid, plasma     Status: Abnormal   Collection Time: 12/13/16  8:57 PM  Result Value Ref Range   Lactic Acid, Venous 3.0 (HH) 0.5 - 1.9 mmol/L    Comment: CRITICAL RESULT CALLED TO, READ BACK BY AND VERIFIED WITH STEVEN JONES ON 12/13/16 AT 2156 BY TLB   Ammonia     Status: Abnormal   Collection Time: 12/13/16  8:57 PM  Result Value Ref Range   Ammonia 37 (H) 9 - 35 umol/L  Blood Culture (routine x 2)     Status: None (Preliminary result)   Collection Time: 12/13/16 10:24 PM  Result Value Ref Range   Specimen Description BLOOD  R ARM    Special Requests BOTTLES DRAWN AEROBIC AND ANAEROBIC  BCAV    Culture NO GROWTH < 12 HOURS    Report Status PENDING   Lactic acid, plasma     Status: Abnormal   Collection Time: 12/14/16 12:21 AM  Result Value Ref Range   Lactic Acid, Venous 2.0 (HH) 0.5 - 1.9 mmol/L    Comment: CRITICAL RESULT CALLED TO, READ BACK BY AND VERIFIED WITH VIVIAN ALI ON 12/14/16 AT 0135 [facility]   Basic metabolic panel     Status: Abnormal   Collection Time: 12/14/16  9:23 AM  Result Value Ref Range   Sodium 147 (H) 135 - 145 mmol/L   Potassium 3.8 3.5 - 5.1 mmol/L   Chloride 112 (H) 101 - 111 mmol/L   CO2 24 22 - 32 mmol/L   Glucose, Bld 91 65 - 99 mg/dL   BUN 19 6 - 20 mg/dL   Creatinine, Ser 1.47 (H) 0.61 - 1.24 mg/dL   Calcium 9.4 8.9 - 10.3 mg/dL   GFR calc non Af Amer 55 (L) >60 mL/min   GFR calc Af Amer >60 >60 mL/min    Comment: (NOTE) The eGFR has been calculated using the CKD EPI equation. This calculation has not been validated in all clinical situations. eGFR's persistently <60 mL/min signify possible Chronic Kidney Disease.    Anion gap 11 5 - 15  CBC     Status: Abnormal   Collection Time: 12/14/16  9:23 AM  Result Value Ref Range   WBC 11.1 (H) 3.8 -  10.6 K/uL   RBC 4.14  (L) 4.40 - 5.90 MIL/uL   Hemoglobin 13.4 13.0 - 18.0 g/dL   HCT 41.4 40.0 - 52.0 %   MCV 99.9 80.0 - 100.0 fL   MCH 32.4 26.0 - 34.0 pg   MCHC 32.5 32.0 - 36.0 g/dL   RDW 15.0 (H) 11.5 - 14.5 %   Platelets 157 150 - 440 K/uL   No components found for: ESR, C REACTIVE PROTEIN MICRO: Recent Results (from the past 720 hour(s))  Blood Culture (routine x 2)     Status: None (Preliminary result)   Collection Time: 12/13/16  8:13 PM  Result Value Ref Range Status   Specimen Description BLOOD R ARM  Final   Special Requests BOTTLES DRAWN AEROBIC AND ANAEROBIC BCHV  Final   Culture NO GROWTH < 12 HOURS  Final   Report Status PENDING  Incomplete  Blood Culture (routine x 2)     Status: None (Preliminary result)   Collection Time: 12/13/16 10:24 PM  Result Value Ref Range Status   Specimen Description BLOOD  R ARM  Final   Special Requests BOTTLES DRAWN AEROBIC AND ANAEROBIC  BCAV  Final   Culture NO GROWTH < 12 HOURS  Final   Report Status PENDING  Incomplete    IMAGING: Ct Head Wo Contrast  Result Date: 12/13/2016 CLINICAL DATA:  Acute onset of generalized weakness. Patient not speaking. Initial encounter. EXAM: CT HEAD WITHOUT CONTRAST TECHNIQUE: Contiguous axial images were obtained from the base of the skull through the vertex without intravenous contrast. COMPARISON:  CT of the head performed 04/29/2016 FINDINGS: Brain: No evidence of acute infarction, hemorrhage, hydrocephalus, extra-axial collection or mass lesion/mass effect. Prominence of the ventricles and sulci reflects moderate cortical volume loss. Mild cerebellar atrophy is noted. Scattered periventricular and subcortical white matter change likely reflects small vessel ischemic microangiopathy. Chronic encephalomalacia is noted at the left basal ganglia, and there is ex vacuo dilatation of the left lateral ventricle. Calcification is seen at the pons. No mass effect or midline shift is seen. Chronic subdural thickening is noted on  the left side. Vascular: No hyperdense vessel or unexpected calcification. Skull: There is no evidence of fracture; the patient is status post left frontoparietal craniotomy. Sinuses/Orbits: The visualized portions of the orbits are within normal limits. The paranasal sinuses and mastoid air cells are well-aerated. Other: No significant soft tissue abnormalities are seen. IMPRESSION: 1. No acute intracranial pathology seen on CT. 2. Moderate cortical volume loss and scattered small vessel ischemic microangiopathy. 3. Chronic encephalomalacia at the left basal ganglia, with ex vacuo dilatation of the left lateral ventricle. Chronic subdural thickening on the left side. Electronically Signed   By: Garald Balding M.D.   On: 12/13/2016 23:02   Dg Chest Port 1 View  Result Date: 12/13/2016 CLINICAL DATA:  Sepsis EXAM: PORTABLE CHEST 1 VIEW COMPARISON:  04/29/2014 FINDINGS: Cardiomediastinal silhouette is stable. No infiltrate or pleural effusion. No pulmonary edema. Bony thorax is unremarkable. IMPRESSION: No active disease. Electronically Signed   By: Lahoma Crocker M.D.   On: 12/13/2016 21:41    Assessment:   Justis Closser is a 51 y.o. male admitted with AMS and now some fevers. Last seen nml Monday afternoon then found slumped over in chair on Tuesday. His mother states he normally stays home in daytime and is relatively functional. 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 On admit initially AF but up to 101 overnight. He has utox + cocaine.  I suspect he has seizure or possible CVA as etiology and less likely HIV related CNS infection. His neck is completely supple. Last CD4 3 months ago was 600. Fever may be from aspiration given AMS. Continue triumeq.  Recommendations Check serum crypto, RPR, TSH.  Agree with LP and would send routine  labs plus Crypto ag and CSF VDRL. Neuro consult. Consider MRI EEG Can cont vanco, change zosyn to ceftraixone 2 gm q 12  Thank you very much for allowing me to participate in the care of this patient. Please call with questions.   Cheral Marker. Ola Spurr, MD

## 2016-12-14 NOTE — Consult Note (Addendum)
Reason for Consult:Altered mental status Referring Physician: Posey Pronto  CC: syncope  HPI: Darius [name] is an 51 y.o. HIV positive male who is altered today and is unable to provide any history.  No family available a this time therefore all history obtained from the chart.  On yesterday patient's mother found him slumped over in his chair and unresponsive. Workup here reveals only elevated lactic acid and cocaine positive urine tox screen. Patient has been admitted in the past with seizures, likely induced by substance abuse.  Mother reports that for the past couple of months the patient has been cognitively slow with slowed verbal responses, etc.    Past Medical History:  Diagnosis Date  . CKD (chronic kidney disease), stage III   . CNS lymphoma ([place])   . Drug abuse   . Hepatitis C   . HIV (human immunodeficiency virus infection) ([place])   . Hypothyroidism   . Seizures ([place])     Past Surgical History:  Procedure Laterality Date  . CRANIOTOMY      Family History  Problem Relation Age of Onset  . Family history unknown: Yes    Social History:  reports that he has been smoking.  He has never used smokeless tobacco. He reports that he uses drugs. He reports that he does not drink alcohol.  Allergies  Allergen Reactions  . Asa [Aspirin] Other (See Comments)    Reaction: unknown  . Sulfa Antibiotics Other (See Comments)    Reaction: unknown    Medications:  I have reviewed the patient's current medications. Prior to Admission:  Prescriptions Prior to Admission  Medication Sig Dispense Refill Last Dose  . abacavir-dolutegravir-lamiVUDine (TRIUMEQ) 600-50-300 MG tablet Take 1 tablet by mouth daily.   unknown at unknown  . levETIRAcetam (KEPPRA) 500 MG tablet Take 1,000 mg by mouth 2 (two) times daily.   unknown at unknown  . levothyroxine (SYNTHROID, LEVOTHROID) 50 MCG tablet Take 50 mcg by mouth daily before breakfast.   unknown at unknown   Scheduled: .  abacavir-dolutegravir-lamiVUDine  1 tablet Oral Daily  . cefTRIAXone (ROCEPHIN)  IV  2 g Intravenous Q12H  . chlorhexidine  15 mL Mouth Rinse BID  . levETIRAcetam  1,000 mg Intravenous Q12H  . levothyroxine  50 mcg Oral QAC breakfast  . mouth rinse  15 mL Mouth Rinse q12n4p  . vancomycin  750 mg Intravenous Q12H    ROS: Unable to provide  Physical Examination: Blood pressure (!) 165/98, pulse 85, temperature 100.2 F (37.9 C), temperature source Axillary, resp. rate 16, height 6' (1.829 m), weight 64.2 kg (141 lb 8 oz), SpO2 98 %.  HEENT-  Normocephalic, no lesions, without obvious abnormality.  Normal external eye and conjunctiva.  Normal TM's bilaterally.  Normal auditory canals and external ears. Normal external nose, mucus membranes and septum.  Normal pharynx. Cardiovascular- S1, S2 normal, pulses palpable throughout   Lungs- chest clear, no wheezing, rales, normal symmetric air entry Abdomen- soft, non-tender; bowel sounds normal; no masses,  no organomegaly Extremities- no edema Lymph-no adenopathy palpable Musculoskeletal-no joint tenderness, deformity or swelling Skin-warm and dry, no hyperpigmentation, vitiligo, or suspicious lesions  Neurological Examination   Mental Status: Eyes closed.  Opens eyes with deep sternal rub.  Patient responds with slurred response but is fluent.  Does not answer any orientation questions.  Does not follow commands.  Questionable right neglect Cranial Nerves: II: Pupils equal, round, reactive to light and accommodation III,IV, VI: ptosis not present, extra-ocular motions intact bilaterally V,VII: Face symmetric  VIII: hearing normal bilaterally IX,X: unable to test XI: unable to test XII: unable to test Motor: Moves all extremities weakly in response to pain Sensory: Responds to noxious stimuli in all extremities Deep Tendon Reflexes: 3+ and symmetric throughout Plantars: Right: upgoing   Left: upgoing Cerebellar: Unable to  perform Gait: not tested due to safety concerns     Laboratory Studies:   Basic Metabolic Panel:  Recent Labs Lab 12/13/16 2013 12/14/16 0923  NA 147* 147*  K 3.8 3.8  CL 110 112*  CO2 27 24  GLUCOSE 110* 91  BUN 21* 19  CREATININE 1.56* 1.47*  CALCIUM 10.0 9.4    Liver Function Tests:  Recent Labs Lab 12/13/16 2013  AST 50*  ALT 27  ALKPHOS 66  BILITOT 0.6  PROT 10.5*  ALBUMIN 4.9    Recent Labs Lab 12/13/16 2013  LIPASE <10*    Recent Labs Lab 12/13/16 2057  AMMONIA 37*    CBC:  Recent Labs Lab 12/13/16 2013 12/14/16 0923  WBC 10.5 11.1*  NEUTROABS 7.5*  --   HGB 14.1 13.4  HCT 42.2 41.4  MCV 99.9 99.9  PLT 191 157    Cardiac Enzymes: No results for input(s): CKTOTAL, CKMB, CKMBINDEX, TROPONINI in the last 168 hours.  BNP: Invalid input(s): POCBNP  CBG: No results for input(s): GLUCAP in the last 168 hours.  Microbiology: Results for orders placed or performed during the hospital encounter of 12/13/16  Blood Culture (routine x 2)     Status: None (Preliminary result)   Collection Time: 12/13/16  8:13 PM  Result Value Ref Range Status   Specimen Description BLOOD R ARM  Final   Special Requests BOTTLES DRAWN AEROBIC AND ANAEROBIC [facility]  Final   Culture NO GROWTH < 12 HOURS  Final   Report Status PENDING  Incomplete  Blood Culture (routine x 2)     Status: None (Preliminary result)   Collection Time: 12/13/16 10:24 PM  Result Value Ref Range Status   Specimen Description BLOOD  R ARM  Final   Special Requests BOTTLES DRAWN AEROBIC AND ANAEROBIC  BCAV  Final   Culture NO GROWTH < 12 HOURS  Final   Report Status PENDING  Incomplete    Coagulation Studies: No results for input(s): LABPROT, INR in the last 72 hours.  Urinalysis:  Recent Labs Lab 12/13/16 2013  COLORURINE YELLOW*  LABSPEC 1.028  PHURINE 5.0  GLUCOSEU NEGATIVE  HGBUR NEGATIVE  BILIRUBINUR NEGATIVE  KETONESUR NEGATIVE  PROTEINUR 100*  NITRITE NEGATIVE   LEUKOCYTESUR NEGATIVE    Lipid Panel:  No results found for: CHOL, TRIG, HDL, CHOLHDL, VLDL, LDLCALC  HgbA1C: No results found for: HGBA1C  Urine Drug Screen:     Component Value Date/Time   LABOPIA NONE DETECTED 12/13/2016 2013   COCAINSCRNUR POSITIVE (A) 12/13/2016 2013   LABBENZ NONE DETECTED 12/13/2016 2013   AMPHETMU NONE DETECTED 12/13/2016 2013   THCU NONE DETECTED 12/13/2016 2013   LABBARB NONE DETECTED 12/13/2016 2013    Alcohol Level:  Recent Labs Lab 12/13/16 2013  ETH <5     Imaging: Ct Head Wo Contrast  Result Date: 12/13/2016 CLINICAL DATA:  Acute onset of generalized weakness. Patient not speaking. Initial encounter. EXAM: CT HEAD WITHOUT CONTRAST TECHNIQUE: Contiguous axial images were obtained from the base of the skull through the vertex without intravenous contrast. COMPARISON:  CT of the head performed 04/29/2016 FINDINGS: Brain: No evidence of acute infarction, hemorrhage, hydrocephalus, extra-axial collection or mass lesion/mass effect. Prominence  of the ventricles and sulci reflects moderate cortical volume loss. Mild cerebellar atrophy is noted. Scattered periventricular and subcortical white matter change likely reflects small vessel ischemic microangiopathy. Chronic encephalomalacia is noted at the left basal ganglia, and there is ex vacuo dilatation of the left lateral ventricle. Calcification is seen at the pons. No mass effect or midline shift is seen. Chronic subdural thickening is noted on the left side. Vascular: No hyperdense vessel or unexpected calcification. Skull: There is no evidence of fracture; the patient is status post left frontoparietal craniotomy. Sinuses/Orbits: The visualized portions of the orbits are within normal limits. The paranasal sinuses and mastoid air cells are well-aerated. Other: No significant soft tissue abnormalities are seen. IMPRESSION: 1. No acute intracranial pathology seen on CT. 2. Moderate cortical volume loss and  scattered small vessel ischemic microangiopathy. 3. Chronic encephalomalacia at the left basal ganglia, with ex vacuo dilatation of the left lateral ventricle. Chronic subdural thickening on the left side. Electronically Signed   By: Garald Balding M.D.   On: 12/13/2016 23:02   Dg Chest Port 1 View  Result Date: 12/13/2016 CLINICAL DATA:  Sepsis EXAM: PORTABLE CHEST 1 VIEW COMPARISON:  04/29/2014 FINDINGS: Cardiomediastinal silhouette is stable. No infiltrate or pleural effusion. No pulmonary edema. Bony thorax is unremarkable. IMPRESSION: No active disease. Electronically Signed   By: Lahoma Crocker M.D.   On: 12/13/2016 21:41     Assessment/Plan: 51 year old HIV positive male with a history of multiple CNS infections, SDH and seizure disorder on Keppra who presents poorly responsive.  Unclear etiology.  Per mother patient does not have tonic-clonic seizures but seizures often found on EEG testing instead.  Patient compliant with HIV medications.  Afebrile.  No progression of mental status.  Head CT reviewed and shows no acute changes.  There is evidence of atrophy and chronic encephalomalacia.  UDS positive for cocaine.    Recommendations: 1.  EEG today. 2.  If EEG unremarkable would consider LP 3.  Continue Keppra at home dose of 1000mg  q12 hours.   4.  Seizure precautions  Alexis Goodell, MD Neurology [phone] 12/14/2016, 3:13 PM

## 2016-12-15 ENCOUNTER — Observation Stay: Payer: Medicare Other

## 2016-12-15 LAB — CSF CELL COUNT WITH DIFFERENTIAL
EOS CSF: 0 %
LYMPHS CSF: 46 %
MONOCYTE-MACROPHAGE-SPINAL FLUID: 8 %
RBC Count, CSF: 585 /mm3 — ABNORMAL HIGH (ref 0–3)
Segmented Neutrophils-CSF: 46 %
TUBE #: 3
WBC, CSF: 2 /mm3 (ref 0–5)

## 2016-12-15 LAB — HELPER T-LYMPH-CD4 (ARMC ONLY)
% CD 4 POS. LYMPH.: 26.8 % — AB (ref 30.8–58.5)
ABSOLUTE CD 4 HELPER: 670 /uL (ref 359–1519)
BASOS ABS: 0 10*3/uL (ref 0.0–0.2)
Basos: 0 %
EOS (ABSOLUTE): 0 10*3/uL (ref 0.0–0.4)
Eos: 0 %
Hematocrit: 41.2 % (ref 37.5–51.0)
Hemoglobin: 13.3 g/dL (ref 13.0–17.7)
IMMATURE GRANS (ABS): 0 10*3/uL (ref 0.0–0.1)
IMMATURE GRANULOCYTES: 0 %
LYMPHS ABS: 2.5 10*3/uL (ref 0.7–3.1)
LYMPHS: 27 %
MCH: 32.1 pg (ref 26.6–33.0)
MCHC: 32.3 g/dL (ref 31.5–35.7)
MCV: 100 fL — AB (ref 79–97)
Monocytes Absolute: 1.2 10*3/uL — ABNORMAL HIGH (ref 0.1–0.9)
Monocytes: 12 %
NEUTROS PCT: 61 %
Neutrophils Absolute: 5.5 10*3/uL (ref 1.4–7.0)
PLATELETS: 182 10*3/uL (ref 150–379)
RBC: 4.14 x10E6/uL (ref 4.14–5.80)
RDW: 14.8 % (ref 12.3–15.4)
WBC: 9.2 10*3/uL (ref 3.4–10.8)

## 2016-12-15 LAB — APTT: APTT: 39 s — AB (ref 24–36)

## 2016-12-15 LAB — RPR: RPR: NONREACTIVE

## 2016-12-15 LAB — PROTEIN AND GLUCOSE, CSF
GLUCOSE CSF: 55 mg/dL (ref 40–70)
TOTAL PROTEIN, CSF: 91 mg/dL — AB (ref 15–45)

## 2016-12-15 LAB — HIV-1 RNA QUANT-NO REFLEX-BLD: LOG10 HIV-1 RNA: UNDETERMINED log10copy/mL

## 2016-12-15 LAB — CRYPTOCOCCAL ANTIGEN, CSF: Crypto Ag: NEGATIVE

## 2016-12-15 LAB — VANCOMYCIN, TROUGH
Vancomycin Tr: 14 ug/mL — ABNORMAL LOW (ref 15–20)
Vancomycin Tr: 17 ug/mL (ref 15–20)

## 2016-12-15 LAB — PROTIME-INR
INR: 1.23
Prothrombin Time: 15.6 seconds — ABNORMAL HIGH (ref 11.4–15.2)

## 2016-12-15 MED ORDER — VANCOMYCIN HCL IN DEXTROSE 1-5 GM/200ML-% IV SOLN
1000.0000 mg | Freq: Two times a day (BID) | INTRAVENOUS | Status: DC
  Filled 2016-12-15: qty 200

## 2016-12-15 MED ORDER — VANCOMYCIN HCL 1000 MG IV SOLR
750.0000 mg | Freq: Two times a day (BID) | INTRAVENOUS | Status: DC
  Administered 2016-12-15 – 2016-12-17 (×4): 750 mg via INTRAVENOUS
  Filled 2016-12-15 (×5): qty 750

## 2016-12-15 MED ORDER — VANCOMYCIN HCL IN DEXTROSE 750-5 MG/150ML-% IV SOLN
750.0000 mg | Freq: Two times a day (BID) | INTRAVENOUS | Status: DC
  Filled 2016-12-15: qty 150

## 2016-12-15 MED ORDER — VANCOMYCIN HCL 500 MG IV SOLR: 250.0000 mg | Freq: Once | INTRAVENOUS | Status: DC

## 2016-12-15 MED ORDER — LIDOCAINE HCL (PF) 1 % IJ SOLN
5.0000 mL | Freq: Once | INTRAMUSCULAR | Status: DC
  Filled 2016-12-15: qty 5

## 2016-12-15 NOTE — Progress Notes (Signed)
Subjective: Patient remains nonverbal and not following commands.    Objective: Current vital signs: BP (!) 145/104   Pulse 76   Temp 98.7 F (37.1 C) (Axillary)   Resp 16   Ht 6' (1.829 m)   Wt 64.2 kg (141 lb 8 oz)   SpO2 97%   BMI 19.19 kg/m  Vital signs in last 24 hours: Temp:  [98.7 F (37.1 C)-100.2 F (37.9 C)] 98.7 F (37.1 C) (03/29 0805) Pulse Rate:  [72-76] 76 (03/29 0805) Resp:  [16] 16 (03/29 0805) BP: (145-156)/(89-104) 145/104 (03/29 0805) SpO2:  [97 %-100 %] 97 % (03/29 0805)  Intake/Output from previous day: 03/28 0701 - 03/29 0700 In: 360 [IV Piggyback:360] Out: -  Intake/Output this shift: No intake/output data recorded. Nutritional status: Diet NPO time specified Except for: Sips with Meds  Neurologic Exam: Mental Status: Eyes open.  No verbal response noted.  Does not answer any orientation questions.  Does not follow commands.  Questionable left neglect Cranial Nerves: II: Pupils equal, round, reactive to light and accommodation III,IV, VI: ptosis not present, extra-ocular motions intact bilaterally V,VII: Face symmetric VIII: hearing normal bilaterally IX,X: unable to test XI: unable to test XII: unable to test Motor: Moves all extremities weakly spontaneously against gravity Sensory: Responds to noxious stimuli in all extremities   Lab Results: Basic Metabolic Panel:  Recent Labs Lab 12/13/16 2013 12/14/16 0923  NA 147* 147*  K 3.8 3.8  CL 110 112*  CO2 27 24  GLUCOSE 110* 91  BUN 21* 19  CREATININE 1.56* 1.47*  CALCIUM 10.0 9.4    Liver Function Tests:  Recent Labs Lab 12/13/16 2013  AST 50*  ALT 27  ALKPHOS 66  BILITOT 0.6  PROT 10.5*  ALBUMIN 4.9    Recent Labs Lab 12/13/16 2013  LIPASE <10*    Recent Labs Lab 12/13/16 2057  AMMONIA 37*    CBC:  Recent Labs Lab 12/13/16 2013 12/14/16 0923 12/14/16 1204  WBC 10.5 11.1* 9.2  NEUTROABS 7.5*  --  5.5  HGB 14.1 13.4  --   HCT 42.2 41.4 41.2   MCV 99.9 99.9 100*  PLT 191 157 182    Cardiac Enzymes: No results for input(s): CKTOTAL, CKMB, CKMBINDEX, TROPONINI in the last 168 hours.  Lipid Panel: No results for input(s): CHOL, TRIG, HDL, CHOLHDL, VLDL, LDLCALC in the last 168 hours.  CBG: No results for input(s): GLUCAP in the last 168 hours.  Microbiology: Results for orders placed or performed during the hospital encounter of 12/13/16  Blood Culture (routine x 2)     Status: None (Preliminary result)   Collection Time: 12/13/16  8:13 PM  Result Value Ref Range Status   Specimen Description BLOOD R ARM  Final   Special Requests BOTTLES DRAWN AEROBIC AND ANAEROBIC [place]  Final   Culture NO GROWTH 2 DAYS  Final   Report Status PENDING  Incomplete  Urine culture     Status: Abnormal (Preliminary result)   Collection Time: 12/13/16  8:13 PM  Result Value Ref Range Status   Specimen Description URINE, RANDOM  Final   Special Requests NONE  Final   Culture (A)  Final    >=100,000 COLONIES/mL STAPHYLOCOCCUS AUREUS SUSCEPTIBILITIES TO FOLLOW Performed at [facility], [place] [address], [place], [place] [address]    Report Status PENDING  Incomplete  Blood Culture (routine x 2)     Status: None (Preliminary result)   Collection Time: 12/13/16 10:24 PM  Result  Value Ref Range Status   Specimen Description BLOOD  R ARM  Final   Special Requests BOTTLES DRAWN AEROBIC AND ANAEROBIC  BCAV  Final   Culture NO GROWTH 2 DAYS  Final   Report Status PENDING  Incomplete  CSF culture     Status: None (Preliminary result)   Collection Time: 12/15/16 11:30 AM  Result Value Ref Range Status   Specimen Description CSF  Final   Special Requests NONE  Final   Gram Stain   Final    MODERATE RED BLOOD CELLS PRESENT RARE WBC SEEN NO ORGANISMS SEEN    Culture PENDING  Incomplete   Report Status PENDING  Incomplete    Coagulation Studies:  Recent Labs  12/15/16 0410  LABPROT 15.6*  INR 1.23    Imaging: Ct Head Wo  Contrast  Result Date: 12/13/2016 CLINICAL DATA:  Acute onset of generalized weakness. Patient not speaking. Initial encounter. EXAM: CT HEAD WITHOUT CONTRAST TECHNIQUE: Contiguous axial images were obtained from the base of the skull through the vertex without intravenous contrast. COMPARISON:  CT of the head performed 04/29/2016 FINDINGS: Brain: No evidence of acute infarction, hemorrhage, hydrocephalus, extra-axial collection or mass lesion/mass effect. Prominence of the ventricles and sulci reflects moderate cortical volume loss. Mild cerebellar atrophy is noted. Scattered periventricular and subcortical white matter change likely reflects small vessel ischemic microangiopathy. Chronic encephalomalacia is noted at the left basal ganglia, and there is ex vacuo dilatation of the left lateral ventricle. Calcification is seen at the pons. No mass effect or midline shift is seen. Chronic subdural thickening is noted on the left side. Vascular: No hyperdense vessel or unexpected calcification. Skull: There is no evidence of fracture; the patient is status post left frontoparietal craniotomy. Sinuses/Orbits: The visualized portions of the orbits are within normal limits. The paranasal sinuses and mastoid air cells are well-aerated. Other: No significant soft tissue abnormalities are seen. IMPRESSION: 1. No acute intracranial pathology seen on CT. 2. Moderate cortical volume loss and scattered small vessel ischemic microangiopathy. 3. Chronic encephalomalacia at the left basal ganglia, with ex vacuo dilatation of the left lateral ventricle. Chronic subdural thickening on the left side. Electronically Signed   By: Garald Balding M.D.   On: 12/13/2016 23:02   Dg Chest Port 1 View  Result Date: 12/13/2016 CLINICAL DATA:  Sepsis EXAM: PORTABLE CHEST 1 VIEW COMPARISON:  04/29/2014 FINDINGS: Cardiomediastinal silhouette is stable. No infiltrate or pleural effusion. No pulmonary edema. Bony thorax is unremarkable.  IMPRESSION: No active disease. Electronically Signed   By: Lahoma Crocker M.D.   On: 12/13/2016 21:41   Dg Fluoro Guided Loc Of Needle/cath Tip For Spinal Inject Lt  Result Date: 12/15/2016 CLINICAL DATA:  Abnormal mental status. EXAM: DIAGNOSTIC LUMBAR PUNCTURE UNDER FLUOROSCOPIC GUIDANCE FLUOROSCOPY TIME:  Fluoroscopy Time:  0 minutes 20 seconds Radiation Exposure Index (if provided by the fluoroscopic device): 5.4 mGy Number of Acquired Spot Images: 1 PROCEDURE: Patient unable to give consent. After discussing the risks and benefits of this procedure with the patient's mother informed consent was obtained. Back was sterilely prepped and draped. Local anesthesia administered 1% lidocaine. 22 gauge needle was advanced into the lumbar canal and clear 9 cc of clear CSF removed and sent for the requested labs. Opening pressure was 9 cm of water. Needle was removed. Hemostasis achieved. No complications. IMPRESSION: Successful fluoroscopically directed lumbar puncture. Electronically Signed   By: Marcello Moores  Register   On: 12/15/2016 12:40    Medications:  I have reviewed the  patient's current medications. Scheduled: . abacavir-dolutegravir-lamiVUDine  1 tablet Oral Daily  . cefTRIAXone (ROCEPHIN)  IV  2 g Intravenous Q12H  . chlorhexidine  15 mL Mouth Rinse BID  . levETIRAcetam  1,000 mg Intravenous Q12H  . levothyroxine  50 mcg Oral QAC breakfast  . lidocaine (PF)  5 mL Other Once  . mouth rinse  15 mL Mouth Rinse q12n4p  . vancomycin (VANCOCIN) 750 mg/12mL IVPB  750 mg Intravenous Q12H    Assessment/Plan: Patient unchanged.  On Keppra.  EEG shows no evidence of electrographic seizure.  LP performed. 2w/585w/protein 91/glucose 55.  Initially not suggestive of infection.  Cryptococcal antigen negative. 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    Recommendations: 1.  ID directing tests required of CSF     LOS: 0 days   Alexis Goodell, MD Neurology [phone] 12/15/2016  2:30 PM

## 2016-12-15 NOTE — Progress Notes (Signed)
Initial Nutrition Assessment  DOCUMENTATION CODES:   Not applicable  INTERVENTION:  1. Monitor for diet advancement  NUTRITION DIAGNOSIS:   Inadequate oral intake related to lethargy/confusion as evidenced by other (see comment) (Per observation)  GOAL:   Patient will meet greater than or equal to 90% of their needs  MONITOR:   I & O's, Labs, Diet advancement, Weight trends  REASON FOR ASSESSMENT:   Low [place]    ASSESSMENT:   Darius [name]  is a 51 y.o. male who presents with Altered mental status. Patient's mother found him slumped over in his chair and unresponsive. Workup here in the states only elevated lactic acid and cocaine positive urine tox screen.  Attempted to speak with Mr. Mineo at bedside but patient has AMS Well controlled HIV Hepatitis C 4#/2.7% insignificant wt loss over 7 months. Nutrition-Focused physical exam completed. Findings are no fat depletion, no muscle depletion, and no edema.  Labs and medications reviewed: Na 147  Diet Order:  Diet NPO time specified Except for: Sips with Meds  Skin:  Reviewed, no issues  Last BM:  PTA  Height:   Ht Readings from Last 1 Encounters:  12/13/16 6' (1.829 m)    Weight:   Wt Readings from Last 1 Encounters:  12/14/16 141 lb 8 oz (64.2 kg)    Ideal Body Weight:  80.9 kg  BMI:  Body mass index is 19.19 kg/m.  Estimated Nutritional Needs:   Kcal:  1950 - 2250 calories  Protein:  83-109 gm  Fluid:  >/= 1.95L  EDUCATION NEEDS:   No education needs identified at this time  Satira Anis. Alayla Dethlefs, MS, RD LDN Inpatient Clinical Dietitian Pager [phone]

## 2016-12-15 NOTE — Progress Notes (Signed)
Pt. Stable after l.p.Back stable.f/u with his MD.

## 2016-12-15 NOTE — Progress Notes (Signed)
Order received for midline catheter, Telephone consent obtained with mother, [name] with two nurses verifying. [facility] called and Claiborne Billings will arrive to insert midline around 1430/1500.

## 2016-12-15 NOTE — Progress Notes (Signed)
[place] at [facility]                                                                                                                                                                                  Patient Demographics   Darius [name], is a 51 y.o. male, DOB - September 04, 1966, OHY:[id number]  Admit date - 12/13/2016   Admitting Physician Lance Coon, MD  Outpatient Primary MD for the patient is Pcp Not In System   LOS - 0  Subjective: Patient admitted with acute encephalopathy. He is awake but still very confused. And not able to communicate much. His mother is at bedside. His mother reports that patient is disabled and lives at home alone but normally he is awake alert and communicative. He does have HIV and she states that last was seen and [facility]. His viral load was undetectable. According to her he is been taking his meds.  The patient is still confused, opens eyes on pain stimuli.  Review of Systems:   CONSTITUTIONAL: Unable to provide  Vitals:   Vitals:   12/14/16 1751 12/14/16 1850 12/14/16 2318 12/15/16 0805  BP: (!) 156/93  (!) 147/89 (!) 145/104  Pulse: 72  72 76  Resp:    16  Temp:  99.8 F (37.7 C) 100.2 F (37.9 C) 98.7 F (37.1 C)  TempSrc:  Axillary Oral Axillary  SpO2: 100%  99% 97%  Weight:      Height:        Wt Readings from Last 3 Encounters:  12/14/16 141 lb 8 oz (64.2 kg)  04/30/16 145 lb (65.8 kg)     Intake/Output Summary (Last 24 hours) at 12/15/16 1507 Last data filed at 12/14/16 2217  Gross per 24 hour  Intake              250 ml  Output                0 ml  Net              250 ml    Physical Exam:   GENERAL: Pleasant-appearing in no apparent distress. He is confused HEAD, EYES, EARS, NOSE AND THROAT: Atraumatic, normocephalic. . Pupils equal and reactive to light. Sclerae anicteric. No conjunctival injection. No oro-pharyngeal erythema.  NECK: Supple. There is no jugular venous  distention. No bruits, no lymphadenopathy, no thyromegaly.  HEART: Regular rate and rhythm,. No murmurs, no rubs, no clicks.  LUNGS: Clear to auscultation bilaterally. No rales or rhonchi. No wheezes.  ABDOMEN: Soft, flat, nontender, nondistended. Has good bowel sounds. No hepatosplenomegaly appreciated.  EXTREMITIES: No evidence of any cyanosis, clubbing, or  peripheral edema.  +2 pedal and radial pulses bilaterally.  NEUROLOGIC: The patient is confused, unable to exam. SKIN: Moist and warm with no rashes appreciated.  Psych: Not anxious, depressed LN: No inguinal LN enlargement    Antibiotics   Anti-infectives    Start     Dose/Rate Route Frequency Ordered Stop   12/15/16 1630  vancomycin (VANCOCIN) IVPB 1000 mg/200 mL premix  Status:  Discontinued     1,000 mg 200 mL/hr over 60 Minutes Intravenous Every 12 hours 12/15/16 0522 12/15/16 0920   12/15/16 1630  vancomycin (VANCOCIN) IVPB 750 mg/150 ml premix  Status:  Discontinued     750 mg 150 mL/hr over 60 Minutes Intravenous Every 12 hours 12/15/16 0920 12/15/16 0930   12/15/16 1630  vancomycin (VANCOCIN) 750 mg in sodium chloride 0.9 % 150 mL IVPB     750 mg 150 mL/hr over 60 Minutes Intravenous Every 12 hours 12/15/16 0930     12/15/16 0530  vancomycin (VANCOCIN) 250 mg in sodium chloride 0.9 % 100 mL IVPB  Status:  Discontinued     250 mg 100 mL/hr over 60 Minutes Intravenous  Once 12/15/16 0523 12/15/16 0525   12/14/16 1430  cefTRIAXone (ROCEPHIN) 2 g in dextrose 5 % 50 mL IVPB     2 g 100 mL/hr over 30 Minutes Intravenous Every 12 hours 12/14/16 1426     12/14/16 1000  abacavir-dolutegravir-lamiVUDine (TRIUMEQ) 600-50-300 MG per tablet 1 tablet     1 tablet Oral Daily 12/14/16 0155     12/14/16 0600  piperacillin-tazobactam (ZOSYN) IVPB 3.375 g  Status:  Discontinued     3.375 g 12.5 mL/hr over 240 Minutes Intravenous Every 8 hours 12/13/16 2232 12/14/16 1426   12/14/16 0430  vancomycin (VANCOCIN) IVPB 750 mg/150 ml premix   Status:  Discontinued     750 mg 150 mL/hr over 60 Minutes Intravenous Every 12 hours 12/13/16 2232 12/15/16 0522   12/13/16 2215  piperacillin-tazobactam (ZOSYN) IVPB 3.375 g     3.375 g 100 mL/hr over 30 Minutes Intravenous  Once 12/13/16 2212 12/13/16 2310   12/13/16 2215  vancomycin (VANCOCIN) IVPB 1000 mg/200 mL premix     1,000 mg 200 mL/hr over 60 Minutes Intravenous  Once 12/13/16 2212 12/13/16 2325      Medications   Scheduled Meds: . abacavir-dolutegravir-lamiVUDine  1 tablet Oral Daily  . cefTRIAXone (ROCEPHIN)  IV  2 g Intravenous Q12H  . chlorhexidine  15 mL Mouth Rinse BID  . levETIRAcetam  1,000 mg Intravenous Q12H  . levothyroxine  50 mcg Oral QAC breakfast  . lidocaine (PF)  5 mL Other Once  . mouth rinse  15 mL Mouth Rinse q12n4p  . vancomycin (VANCOCIN) 750 mg/140mL IVPB  750 mg Intravenous Q12H   Continuous Infusions: PRN Meds:.acetaminophen **OR** acetaminophen, ondansetron **OR** ondansetron (ZOFRAN) IV   Data Review:   Micro Results Recent Results (from the past 240 hour(s))  Blood Culture (routine x 2)     Status: None (Preliminary result)   Collection Time: 12/13/16  8:13 PM  Result Value Ref Range Status   Specimen Description BLOOD R ARM  Final   Special Requests BOTTLES DRAWN AEROBIC AND ANAEROBIC [place]  Final   Culture NO GROWTH 2 DAYS  Final   Report Status PENDING  Incomplete  Urine culture     Status: Abnormal (Preliminary result)   Collection Time: 12/13/16  8:13 PM  Result Value Ref Range Status   Specimen Description URINE, RANDOM  Final  Special Requests NONE  Final   Culture (A)  Final    >=100,000 COLONIES/mL STAPHYLOCOCCUS AUREUS SUSCEPTIBILITIES TO FOLLOW Performed at [facility], [place] [address] [place], [place] [address]    Report Status PENDING  Incomplete  Blood Culture (routine x 2)     Status: None (Preliminary result)   Collection Time: 12/13/16 10:24 PM  Result Value Ref Range Status   Specimen Description  BLOOD  R ARM  Final   Special Requests BOTTLES DRAWN AEROBIC AND ANAEROBIC  BCAV  Final   Culture NO GROWTH 2 DAYS  Final   Report Status PENDING  Incomplete  CSF culture     Status: None (Preliminary result)   Collection Time: 12/15/16 11:30 AM  Result Value Ref Range Status   Specimen Description CSF  Final   Special Requests NONE  Final   Gram Stain   Final    MODERATE RED BLOOD CELLS PRESENT RARE WBC SEEN NO ORGANISMS SEEN    Culture PENDING  Incomplete   Report Status PENDING  Incomplete    Radiology Reports Ct Head Wo Contrast  Result Date: 12/13/2016 CLINICAL DATA:  Acute onset of generalized weakness. Patient not speaking. Initial encounter. EXAM: CT HEAD WITHOUT CONTRAST TECHNIQUE: Contiguous axial images were obtained from the base of the skull through the vertex without intravenous contrast. COMPARISON:  CT of the head performed 04/29/2016 FINDINGS: Brain: No evidence of acute infarction, hemorrhage, hydrocephalus, extra-axial collection or mass lesion/mass effect. Prominence of the ventricles and sulci reflects moderate cortical volume loss. Mild cerebellar atrophy is noted. Scattered periventricular and subcortical white matter change likely reflects small vessel ischemic microangiopathy. Chronic encephalomalacia is noted at the left basal ganglia, and there is ex vacuo dilatation of the left lateral ventricle. Calcification is seen at the pons. No mass effect or midline shift is seen. Chronic subdural thickening is noted on the left side. Vascular: No hyperdense vessel or unexpected calcification. Skull: There is no evidence of fracture; the patient is status post left frontoparietal craniotomy. Sinuses/Orbits: The visualized portions of the orbits are within normal limits. The paranasal sinuses and mastoid air cells are well-aerated. Other: No significant soft tissue abnormalities are seen. IMPRESSION: 1. No acute intracranial pathology seen on CT. 2. Moderate cortical volume loss  and scattered small vessel ischemic microangiopathy. 3. Chronic encephalomalacia at the left basal ganglia, with ex vacuo dilatation of the left lateral ventricle. Chronic subdural thickening on the left side. Electronically Signed   By: Garald Balding M.D.   On: 12/13/2016 23:02   Dg Chest Port 1 View  Result Date: 12/13/2016 CLINICAL DATA:  Sepsis EXAM: PORTABLE CHEST 1 VIEW COMPARISON:  04/29/2014 FINDINGS: Cardiomediastinal silhouette is stable. No infiltrate or pleural effusion. No pulmonary edema. Bony thorax is unremarkable. IMPRESSION: No active disease. Electronically Signed   By: Lahoma Crocker M.D.   On: 12/13/2016 21:41   Dg Fluoro Guided Loc Of Needle/cath Tip For Spinal Inject Lt  Result Date: 12/15/2016 CLINICAL DATA:  Abnormal mental status. EXAM: DIAGNOSTIC LUMBAR PUNCTURE UNDER FLUOROSCOPIC GUIDANCE FLUOROSCOPY TIME:  Fluoroscopy Time:  0 minutes 20 seconds Radiation Exposure Index (if provided by the fluoroscopic device): 5.4 mGy Number of Acquired Spot Images: 1 PROCEDURE: Patient unable to give consent. After discussing the risks and benefits of this procedure with the patient's mother informed consent was obtained. Back was sterilely prepped and draped. Local anesthesia administered 1% lidocaine. 22 gauge needle was advanced into the lumbar canal and clear 9 cc of clear CSF  removed and sent for the requested labs. Opening pressure was 9 cm of water. Needle was removed. Hemostasis achieved. No complications. IMPRESSION: Successful fluoroscopically directed lumbar puncture. Electronically Signed   By: Marcello Moores  Register   On: 12/15/2016 12:40     CBC  Recent Labs Lab 12/13/16 2013 12/14/16 0923 12/14/16 1204  WBC 10.5 11.1* 9.2  HGB 14.1 13.4  --   HCT 42.2 41.4 41.2  PLT 191 157 182  MCV 99.9 99.9 100*  MCH 33.5 32.4 32.1  MCHC 33.5 32.5 32.3  RDW 15.3* 15.0* 14.8  LYMPHSABS 1.7  --  2.5  MONOABS 1.2*  --   --   EOSABS 0.0  --  0.0  BASOSABS 0.0  --  0.0    Chemistries    Recent Labs Lab 12/13/16 2013 12/14/16 0923  NA 147* 147*  K 3.8 3.8  CL 110 112*  CO2 27 24  GLUCOSE 110* 91  BUN 21* 19  CREATININE 1.56* 1.47*  CALCIUM 10.0 9.4  AST 50*  --   ALT 27  --   ALKPHOS 66  --   BILITOT 0.6  --    ------------------------------------------------------------------------------------------------------------------ estimated creatinine clearance is 55.8 mL/min (A) (by C-G formula based on SCr of 1.47 mg/dL (H)). ------------------------------------------------------------------------------------------------------------------ No results for input(s): HGBA1C in the last 72 hours. ------------------------------------------------------------------------------------------------------------------ No results for input(s): CHOL, HDL, LDLCALC, TRIG, CHOLHDL, LDLDIRECT in the last 72 hours. ------------------------------------------------------------------------------------------------------------------  Recent Labs  12/14/16 1503  TSH 1.468   ------------------------------------------------------------------------------------------------------------------ No results for input(s): VITAMINB12, FOLATE, FERRITIN, TIBC, IRON, RETICCTPCT in the last 72 hours.  Coagulation profile  Recent Labs Lab 12/15/16 0410  INR 1.23    No results for input(s): DDIMER in the last 72 hours.  Cardiac Enzymes No results for input(s): CKMB, TROPONINI, MYOGLOBIN in the last 168 hours.  Invalid input(s): CK ------------------------------------------------------------------------------------------------------------------ Invalid input(s): [place]  Patient is a 51 year old with HIV with acute mental status changes  1. Acute encephalopathy: Differential include infectious or drug-induced. Ammonia is slightly abnormal not the cause for his encephalopathy.  Per Dr. Ola Spurr, Check serum crypto, RPR, TSH: Unremarkable.  LP done,  routine labs  plus Crypto ag and CSF VDRL: negative. Follow-up culture. Neuro consult. Consider MRI EEG cont vanco, changed zosyn to ceftraixone 2 gm q 12 Per Dr. Doy Mince, On [place].  EEG shows no evidence of electrographic seizure.  Initially not suggestive of infection.  Cryptococcal antigen negative. 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     2. History of seizure disorder currently on IV Keppra  3. Hypothyroidism continue Synthroid  4. HIV continue HIV meds, f/u  order CD4 count  5. Miscellaneous due to his lumbar puncture I'll discontinue Lovenox  CKD stage II. Stable. I discussed with Dr. Doy Mince.    Code Status Orders        Start     Ordered   12/14/16 0156  Full code  Continuous     12/14/16 0155    Code Status History    Date Active Date Inactive Code Status Order ID Comments User Context   04/30/2016 12:53 AM 04/30/2016  5:45 PM Full Code [id number]  Lance Coon, MD Inpatient        * Discussed the case with Dr. Doy Mince.  Consults  Id ,neuro DVT Prophylaxis  scd's   Lab Results  Component Value Date   PLT 182 12/14/2016     Time Spent in minutes   38 min  Greater  than 50% of time spent in care coordination and counseling patient regarding the condition and plan of care.   Demetrios Loll M.D on 12/15/2016 at 3:07 PM  Between 7am to 6pm - Pager - [phone]  After 6pm go to [email or web address] - password [email or web address] [facility]   Office  [phone]

## 2016-12-15 NOTE — Progress Notes (Signed)
Pharmacy Antibiotic Note  Darius [name] is a 51 y.o. male admitted on 12/13/2016 with sepsis.  Pharmacy has been consulted for vancomycin dosing.  Plan: VT = 17 mcg/mL (therapeutic) at steady state. Continue current regimen of vancomycin 750 mg IV q12h Goal VT 15-20 mcg/mL Pharmacy to follow renal function and determine when to recheck VT   Height: 6' (182.9 cm) Weight: 150 lb (68 kg) IBW/kg (Calculated) : 77.6  Temp (24hrs), Avg:98.8 F (37.1 C), Min:98.8 F (37.1 C), Max:98.8 F (37.1 C)   Last Labs    Recent Labs Lab 12/13/16 2013 12/13/16 2057  WBC 10.5  --   CREATININE 1.56*  --   LATICACIDVEN  --  3.0*      Estimated Creatinine Clearance: 55.7 mL/min (A) (by C-G formula based on SCr of 1.56 mg/dL (H)).        Allergies  Allergen Reactions  . Asa [Aspirin]   . Sulfa Antibiotics     Thank you for allowing pharmacy to be a part of this patient's care.  Lenis Noon, PharmD, BCPS Clinical Pharmacist 12/15/2016

## 2016-12-15 NOTE — Progress Notes (Addendum)
Pharmacy Antibiotic Note  Darius [name] is a 51 y.o. male admitted on 12/13/2016 with sepsis.  Pharmacy has been consulted for vancomycin/zosyn dosing.  Plan: Patient received vancomycin 1g IV x 1 in ED Will start vancomycin 750 mg q12h 3/28 @ 0430. Will check a vanc trough 3/29 @ 0330 prior to 3rd dose to ensure clearance of drug Goal trough 15 - 20 mcg/mL Will start zosyn 3.375g IV q8h per extended infusion. Will continue to monitor renal function.  3/29 @ 0410 VT 14 subtherapeutic, although drawn before steady state achieved. Will continue current dose and recheck level before fifth dose.   Height: 6' (182.9 cm) Weight: 150 lb (68 kg) IBW/kg (Calculated) : 77.6  Temp (24hrs), Avg:98.8 F (37.1 C), Min:98.8 F (37.1 C), Max:98.8 F (37.1 C)   Last Labs    Recent Labs Lab 12/13/16 2013 12/13/16 2057  WBC 10.5  --   CREATININE 1.56*  --   LATICACIDVEN  --  3.0*      Estimated Creatinine Clearance: 55.7 mL/min (A) (by C-G formula based on SCr of 1.56 mg/dL (H)).        Allergies  Allergen Reactions  . Asa [Aspirin]   . Sulfa Antibiotics     Thank you for allowing pharmacy to be a part of this patient's care.  Tobie Lords, PharmD, BCPS Clinical Pharmacist 12/15/2016

## 2016-12-16 LAB — HERPES SIMPLEX VIRUS(HSV) DNA BY PCR
HSV 1 DNA: NEGATIVE
HSV 2 DNA: NEGATIVE

## 2016-12-16 LAB — VDRL, CSF: VDRL Quant, CSF: NONREACTIVE

## 2016-12-16 LAB — CREATININE, SERUM
Creatinine, Ser: 1.28 mg/dL — ABNORMAL HIGH (ref 0.61–1.24)
GFR calc Af Amer: >60 mL/min (ref >60)
GFR calc non Af Amer: >60 mL/min (ref >60)

## 2016-12-16 LAB — CRYPTOCOCCUS ANTIGEN, SERUM: Cryptococcus Antigen, Serum: NEGATIVE

## 2016-12-16 LAB — URINE CULTURE: Culture: >=100000 — AB

## 2016-12-16 MED ORDER — ENOXAPARIN SODIUM 40 MG/0.4ML ~~LOC~~ SOLN
40.0000 mg | SUBCUTANEOUS | Status: DC
  Administered 2016-12-16 – 2016-12-28 (×10): 40 mg via SUBCUTANEOUS
  Filled 2016-12-16 (×11): qty 0.4

## 2016-12-16 MED ORDER — LEVOTHYROXINE SODIUM 100 MCG IV SOLR
25.0000 ug | Freq: Every day | INTRAVENOUS | Status: DC
  Administered 2016-12-16 – 2016-12-22 (×7): 25 ug via INTRAVENOUS
  Filled 2016-12-16 (×7): qty 5

## 2016-12-16 MED ORDER — PNEUMOCOCCAL VAC POLYVALENT 25 MCG/0.5ML IJ INJ
0.5000 mL | INJECTION | INTRAMUSCULAR | Status: AC
  Administered 2016-12-18: 0.5 mL via INTRAMUSCULAR
  Filled 2016-12-16: qty 0.5

## 2016-12-16 MED ORDER — FLUCONAZOLE IN SODIUM CHLORIDE 100-0.9 MG/50ML-% IV SOLN
100.0000 mg | INTRAVENOUS | Status: AC
  Administered 2016-12-16 – 2016-12-18 (×3): 100 mg via INTRAVENOUS
  Filled 2016-12-16 (×5): qty 50

## 2016-12-16 MED ORDER — POTASSIUM CHLORIDE IN NACL 20-0.9 MEQ/L-% IV SOLN
INTRAVENOUS | Status: DC
  Administered 2016-12-16 – 2016-12-17 (×2): via INTRAVENOUS
  Administered 2016-12-17: 100 mL/h via INTRAVENOUS
  Administered 2016-12-18 – 2016-12-25 (×6): via INTRAVENOUS
  Administered 2016-12-27: 50 mL/h via INTRAVENOUS
  Administered 2016-12-28: 08:00:00 via INTRAVENOUS
  Filled 2016-12-16 (×23): qty 1000

## 2016-12-16 NOTE — Progress Notes (Signed)
SLP Cancellation Note  Patient Details Name: Ariel Wingrove MRN: [id number] DOB: 27-Dec-1965   Cancelled treatment:       Reason Eval/Treat Not Completed: Patient's level of consciousness (chart reviewed; NSG consulted; BSE attempted w/ pt).  Met w/ pt at bedside; pt was nonverbal and did not look to SLP despite mod-max verbal/tactile cues. When given tactile stim of po trials, pt turned his head away and put a hand up x1. He did not open mouth or appear to engage in task w/ SLP. No further trials attempted. Held on BSE.  ST services will f/u on Saturday to assess appropriateness for BSE. NSG updated and agreed.    Orinda Kenner, MS, CCC-SLP Watson,Katherine 12/16/2016, 11:46 AM

## 2016-12-16 NOTE — Plan of Care (Signed)
Problem: Physical Regulation: Goal: Complications related to the disease process, condition or treatment will be avoided or minimized Outcome: Not Progressing Patient still unresponsive, but alert.   Deri Fuelling

## 2016-12-16 NOTE — Progress Notes (Addendum)
[place] at [facility]                                                                                                                                                                                  Patient Demographics   Darius [name], is a 51 y.o. male, DOB - 04-Jan-1966, TKW:[id number]  Admit date - 12/13/2016   Admitting Physician Lance Coon, MD  Outpatient Primary MD for the patient is Pcp Not In System   LOS - 0  Subjective: Patient admitted with acute encephalopathy. He is awake but still very confused. And not able to communicate much. His mother is at bedside. His mother reports that patient is disabled and lives at home alone but normally he is awake alert and communicative. He does have HIV and she states that last was seen and [facility]. His viral load was undetectable. According to her he is been taking his meds.  The patient is still confused, Sometimes opens eyes, he is unable to follow commands.  Review of Systems:   CONSTITUTIONAL: Unable to provide  Vitals:   Vitals:   12/15/16 0805 12/15/16 1624 12/15/16 2347 12/16/16 0900  BP: (!) 145/104 125/86 127/77 135/66  Pulse: 76 75 68 90  Resp: 16 18 18 20   Temp: 98.7 F (37.1 C) 98.3 F (36.8 C) 98.7 F (37.1 C) 98.7 F (37.1 C)  TempSrc: Axillary Oral  Oral  SpO2: 97%  98% 100%  Weight:      Height:        Wt Readings from Last 3 Encounters:  12/14/16 141 lb 8 oz (64.2 kg)  04/30/16 145 lb (65.8 kg)     Intake/Output Summary (Last 24 hours) at 12/16/16 1649 Last data filed at 12/16/16 1529  Gross per 24 hour  Intake           841.67 ml  Output                0 ml  Net           841.67 ml    Physical Exam:   GENERAL: Pleasant-appearing in no apparent distress. He is confused HEAD, EYES, EARS, NOSE AND THROAT: Atraumatic, normocephalic. . Pupils equal and reactive to light. Sclerae anicteric. No conjunctival injection. No oro-pharyngeal erythema.   NECK: Supple. There is no jugular venous distention. No bruits, no lymphadenopathy, no thyromegaly.  HEART: Regular rate and rhythm,. No murmurs, no rubs, no clicks.  LUNGS: Clear to auscultation bilaterally. No rales or rhonchi. No wheezes.  ABDOMEN: Soft, flat, nontender, nondistended. Has good bowel sounds. No hepatosplenomegaly appreciated.  EXTREMITIES: No evidence of any cyanosis, clubbing, or peripheral  edema.  +2 pedal and radial pulses bilaterally.  NEUROLOGIC: The patient is confused, unable to exam. SKIN: Moist and warm with no rashes appreciated.  Psych: Not anxious, depressed LN: No inguinal LN enlargement    Antibiotics   Anti-infectives    Start     Dose/Rate Route Frequency Ordered Stop   12/16/16 1200  fluconazole (DIFLUCAN) IVPB 100 mg     100 mg 50 mL/hr over 60 Minutes Intravenous Every 24 hours 12/16/16 1140 12/19/16 1159   12/15/16 1630  vancomycin (VANCOCIN) IVPB 1000 mg/200 mL premix  Status:  Discontinued     1,000 mg 200 mL/hr over 60 Minutes Intravenous Every 12 hours 12/15/16 0522 12/15/16 0920   12/15/16 1630  vancomycin (VANCOCIN) IVPB 750 mg/150 ml premix  Status:  Discontinued     750 mg 150 mL/hr over 60 Minutes Intravenous Every 12 hours 12/15/16 0920 12/15/16 0930   12/15/16 1630  vancomycin (VANCOCIN) 750 mg in sodium chloride 0.9 % 150 mL IVPB     750 mg 150 mL/hr over 60 Minutes Intravenous Every 12 hours 12/15/16 0930     12/15/16 0530  vancomycin (VANCOCIN) 250 mg in sodium chloride 0.9 % 100 mL IVPB  Status:  Discontinued     250 mg 100 mL/hr over 60 Minutes Intravenous  Once 12/15/16 0523 12/15/16 0525   12/14/16 1430  cefTRIAXone (ROCEPHIN) 2 g in dextrose 5 % 50 mL IVPB     2 g 100 mL/hr over 30 Minutes Intravenous Every 12 hours 12/14/16 1426     12/14/16 1000  abacavir-dolutegravir-lamiVUDine (TRIUMEQ) 600-50-300 MG per tablet 1 tablet     1 tablet Oral Daily 12/14/16 0155     12/14/16 0600  piperacillin-tazobactam (ZOSYN) IVPB 3.375  g  Status:  Discontinued     3.375 g 12.5 mL/hr over 240 Minutes Intravenous Every 8 hours 12/13/16 2232 12/14/16 1426   12/14/16 0430  vancomycin (VANCOCIN) IVPB 750 mg/150 ml premix  Status:  Discontinued     750 mg 150 mL/hr over 60 Minutes Intravenous Every 12 hours 12/13/16 2232 12/15/16 0522   12/13/16 2215  piperacillin-tazobactam (ZOSYN) IVPB 3.375 g     3.375 g 100 mL/hr over 30 Minutes Intravenous  Once 12/13/16 2212 12/13/16 2310   12/13/16 2215  vancomycin (VANCOCIN) IVPB 1000 mg/200 mL premix     1,000 mg 200 mL/hr over 60 Minutes Intravenous  Once 12/13/16 2212 12/13/16 2325      Medications   Scheduled Meds: . abacavir-dolutegravir-lamiVUDine  1 tablet Oral Daily  . cefTRIAXone (ROCEPHIN)  IV  2 g Intravenous Q12H  . chlorhexidine  15 mL Mouth Rinse BID  . enoxaparin (LOVENOX) injection  40 mg Subcutaneous Q24H  . fluconazole (DIFLUCAN) IV  100 mg Intravenous Q24H  . levETIRAcetam  1,000 mg Intravenous Q12H  . levothyroxine  25 mcg Intravenous Daily  . lidocaine (PF)  5 mL Other Once  . mouth rinse  15 mL Mouth Rinse q12n4p  . [START ON 12/17/2016] pneumococcal 23 valent vaccine  0.5 mL Intramuscular Tomorrow-1000  . vancomycin (VANCOCIN) 750 mg/182mL IVPB  750 mg Intravenous Q12H   Continuous Infusions: . 0.9 % NaCl with KCl 20 mEq / L 100 mL/hr at 12/16/16 1231   PRN Meds:.acetaminophen **OR** acetaminophen, ondansetron **OR** ondansetron (ZOFRAN) IV   Data Review:   Micro Results Recent Results (from the past 240 hour(s))  Blood Culture (routine x 2)     Status: None (Preliminary result)   Collection Time: 12/13/16  8:13  PM  Result Value Ref Range Status   Specimen Description BLOOD R ARM  Final   Special Requests BOTTLES DRAWN AEROBIC AND ANAEROBIC [place]  Final   Culture NO GROWTH 3 DAYS  Final   Report Status PENDING  Incomplete  Urine culture     Status: Abnormal   Collection Time: 12/13/16  8:13 PM  Result Value Ref Range Status   Specimen  Description URINE, RANDOM  Final   Special Requests NONE  Final   Culture (A)  Final    >=100,000 COLONIES/mL METHICILLIN RESISTANT STAPHYLOCOCCUS AUREUS   Report Status 12/16/2016 FINAL  Final   Organism ID, Bacteria METHICILLIN RESISTANT STAPHYLOCOCCUS AUREUS (A)  Final      Susceptibility   Methicillin resistant staphylococcus aureus - MIC*    CIPROFLOXACIN >=8 RESISTANT Resistant     GENTAMICIN >=16 RESISTANT Resistant     NITROFURANTOIN <=16 SENSITIVE Sensitive     OXACILLIN >=4 RESISTANT Resistant     TETRACYCLINE <=1 SENSITIVE Sensitive     VANCOMYCIN 1 SENSITIVE Sensitive     TRIMETH/SULFA <=10 SENSITIVE Sensitive     CLINDAMYCIN >=8 RESISTANT Resistant     RIFAMPIN <=0.5 SENSITIVE Sensitive     Inducible Clindamycin NEGATIVE Sensitive     * >=100,000 COLONIES/mL METHICILLIN RESISTANT STAPHYLOCOCCUS AUREUS  Blood Culture (routine x 2)     Status: None (Preliminary result)   Collection Time: 12/13/16 10:24 PM  Result Value Ref Range Status   Specimen Description BLOOD  R ARM  Final   Special Requests BOTTLES DRAWN AEROBIC AND ANAEROBIC  BCAV  Final   Culture NO GROWTH 3 DAYS  Final   Report Status PENDING  Incomplete  CSF culture     Status: None (Preliminary result)   Collection Time: 12/15/16 11:30 AM  Result Value Ref Range Status   Specimen Description CSF  Final   Special Requests NONE  Final   Gram Stain   Final    MODERATE RED BLOOD CELLS PRESENT RARE WBC SEEN NO ORGANISMS SEEN    Culture PENDING  Incomplete   Report Status PENDING  Incomplete    Radiology Reports Ct Head Wo Contrast  Result Date: 12/13/2016 CLINICAL DATA:  Acute onset of generalized weakness. Patient not speaking. Initial encounter. EXAM: CT HEAD WITHOUT CONTRAST TECHNIQUE: Contiguous axial images were obtained from the base of the skull through the vertex without intravenous contrast. COMPARISON:  CT of the head performed 04/29/2016 FINDINGS: Brain: No evidence of acute infarction,  hemorrhage, hydrocephalus, extra-axial collection or mass lesion/mass effect. Prominence of the ventricles and sulci reflects moderate cortical volume loss. Mild cerebellar atrophy is noted. Scattered periventricular and subcortical white matter change likely reflects small vessel ischemic microangiopathy. Chronic encephalomalacia is noted at the left basal ganglia, and there is ex vacuo dilatation of the left lateral ventricle. Calcification is seen at the pons. No mass effect or midline shift is seen. Chronic subdural thickening is noted on the left side. Vascular: No hyperdense vessel or unexpected calcification. Skull: There is no evidence of fracture; the patient is status post left frontoparietal craniotomy. Sinuses/Orbits: The visualized portions of the orbits are within normal limits. The paranasal sinuses and mastoid air cells are well-aerated. Other: No significant soft tissue abnormalities are seen. IMPRESSION: 1. No acute intracranial pathology seen on CT. 2. Moderate cortical volume loss and scattered small vessel ischemic microangiopathy. 3. Chronic encephalomalacia at the left basal ganglia, with ex vacuo dilatation of the left lateral ventricle. Chronic subdural thickening on the left side.  Electronically Signed   By: Garald Balding M.D.   On: 12/13/2016 23:02   Dg Chest Port 1 View  Result Date: 12/13/2016 CLINICAL DATA:  Sepsis EXAM: PORTABLE CHEST 1 VIEW COMPARISON:  04/29/2014 FINDINGS: Cardiomediastinal silhouette is stable. No infiltrate or pleural effusion. No pulmonary edema. Bony thorax is unremarkable. IMPRESSION: No active disease. Electronically Signed   By: Lahoma Crocker M.D.   On: 12/13/2016 21:41   Dg Fluoro Guided Loc Of Needle/cath Tip For Spinal Inject Lt  Result Date: 12/15/2016 CLINICAL DATA:  Abnormal mental status. EXAM: DIAGNOSTIC LUMBAR PUNCTURE UNDER FLUOROSCOPIC GUIDANCE FLUOROSCOPY TIME:  Fluoroscopy Time:  0 minutes 20 seconds Radiation Exposure Index (if provided by  the fluoroscopic device): 5.4 mGy Number of Acquired Spot Images: 1 PROCEDURE: Patient unable to give consent. After discussing the risks and benefits of this procedure with the patient's mother informed consent was obtained. Back was sterilely prepped and draped. Local anesthesia administered 1% lidocaine. 22 gauge needle was advanced into the lumbar canal and clear 9 cc of clear CSF removed and sent for the requested labs. Opening pressure was 9 cm of water. Needle was removed. Hemostasis achieved. No complications. IMPRESSION: Successful fluoroscopically directed lumbar puncture. Electronically Signed   By: Marcello Moores  Register   On: 12/15/2016 12:40     CBC  Recent Labs Lab 12/13/16 2013 12/14/16 0923 12/14/16 1204  WBC 10.5 11.1* 9.2  HGB 14.1 13.4  --   HCT 42.2 41.4 41.2  PLT 191 157 182  MCV 99.9 99.9 100*  MCH 33.5 32.4 32.1  MCHC 33.5 32.5 32.3  RDW 15.3* 15.0* 14.8  LYMPHSABS 1.7  --  2.5  MONOABS 1.2*  --   --   EOSABS 0.0  --  0.0  BASOSABS 0.0  --  0.0    Chemistries   Recent Labs Lab 12/13/16 2013 12/14/16 0923 12/16/16 0600  NA 147* 147*  --   K 3.8 3.8  --   CL 110 112*  --   CO2 27 24  --   GLUCOSE 110* 91  --   BUN 21* 19  --   CREATININE 1.56* 1.47* 1.28*  CALCIUM 10.0 9.4  --   AST 50*  --   --   ALT 27  --   --   ALKPHOS 66  --   --   BILITOT 0.6  --   --    ------------------------------------------------------------------------------------------------------------------ estimated creatinine clearance is 64.1 mL/min (A) (by C-G formula based on SCr of 1.28 mg/dL (H)). ------------------------------------------------------------------------------------------------------------------ No results for input(s): HGBA1C in the last 72 hours. ------------------------------------------------------------------------------------------------------------------ No results for input(s): CHOL, HDL, LDLCALC, TRIG, CHOLHDL, LDLDIRECT in the last 72  hours. ------------------------------------------------------------------------------------------------------------------  Recent Labs  12/14/16 1503  TSH 1.468   ------------------------------------------------------------------------------------------------------------------ No results for input(s): VITAMINB12, FOLATE, FERRITIN, TIBC, IRON, RETICCTPCT in the last 72 hours.  Coagulation profile  Recent Labs Lab 12/15/16 0410  INR 1.23    No results for input(s): DDIMER in the last 72 hours.  Cardiac Enzymes No results for input(s): CKMB, TROPONINI, MYOGLOBIN in the last 168 hours.  Invalid input(s): CK ------------------------------------------------------------------------------------------------------------------ Invalid input(s): [place]  Patient is a 51 year old with HIV with acute mental status changes  1. Acute encephalopathy: Differential include infectious or drug-induced. Ammonia is slightly abnormal not the cause for his encephalopathy.  Per Dr. Ola Spurr, Check serum crypto, RPR, TSH: Unremarkable.  LP done,  routine labs plus Crypto ag and CSF VDRL: negative. Follow-up culture. Blood cultures  negative. Urinalysis is normal but the urine culture show MRSA. cont vanco, changed zosyn to ceftraixone 2 gm q 12 Per Dr. Doy Mince, On [place].  EEG shows no evidence of electrographic seizure.  Initially not suggestive of infection.  Cryptococcal antigen negative. 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 He didn't follow commands, unable to get speech study. NPO with IVF.  2. History of seizure disorder currently on IV Keppra  3. Hypothyroidism continue Synthroid, change to IV.  4. HIV continue HIV meds, f/u  order CD4 count  5.DVT prophylaxis, resumed Lovenox.  CKD stage II. Stable. I discussed with Dr. Doy Mince.    Code Status Orders        Start     Ordered   12/14/16 0156  Full code  Continuous     12/14/16 0155    Code  Status History    Date Active Date Inactive Code Status Order ID Comments User Context   04/30/2016 12:53 AM 04/30/2016  5:45 PM Full Code [id number]  Lance Coon, MD Inpatient        * Discussed the case with pt's mother and Dr. Doy Mince.  Consults  Id ,neuro DVT Prophylaxis  scd's   Lab Results  Component Value Date   PLT 182 12/14/2016     Time Spent in minutes   35 min  Greater than 50% of time spent in care coordination and counseling patient regarding the condition and plan of care.   Demetrios Loll M.D on 12/16/2016 at 4:49 PM  Between 7am to 6pm - Pager - [phone]  After 6pm go to [email or web address] - password [email or web address] [facility]   Office  [phone]

## 2016-12-16 NOTE — Progress Notes (Signed)
[facility] responded to an OR for an AD. [place] assessed that education for AD could not be completed due to his level of consciousness at the time of my visit. [facility] is available for follow up education when Pt is able to communicate.    12/16/16 1500  Clinical Encounter Type  Visited With Patient;Health care provider  Visit Type Initial  Referral From Nurse  Consult/Referral To Chaplain  Spiritual Encounters  Spiritual Needs Literature

## 2016-12-16 NOTE — Care Management Note (Signed)
Case Management Note  Patient Details  Name: Darius [name] MRN: [id number] Date of Birth: 1966/05/16  Subjective/Objective:    Spoke with patients mom. She states patient has Medicare,  Medicare  Part D and Medicaid. PCP is at [facility]. No further needs identified. Case Closed.                 Action/Plan:   Expected Discharge Date:                  Expected Discharge Plan:     In-House Referral:     Discharge planning Services     Post Acute Care Choice:    Choice offered to:     DME Arranged:    DME Agency:     HH Arranged:    HH Agency:     Status of Service:     If discussed at [employer] of Stay Meetings, dates discussed:    Additional Comments:  Jolly Mango, RN 12/16/2016, 11:35 AM

## 2016-12-16 NOTE — Care Management (Signed)
Patient very confused. RNCM needs to speak with patient or family regarding discharge planning. Attempted to reach mother, Stephen Baruch @ [phone]. LM on VM for return call.

## 2016-12-17 MED ORDER — LEVETIRACETAM 500 MG/5ML IV SOLN
750.0000 mg | Freq: Two times a day (BID) | INTRAVENOUS | Status: DC
  Administered 2016-12-17 – 2016-12-20 (×6): 750 mg via INTRAVENOUS
  Filled 2016-12-17 (×8): qty 7.5

## 2016-12-17 MED ORDER — VANCOMYCIN HCL IN DEXTROSE 750-5 MG/150ML-% IV SOLN
750.0000 mg | Freq: Two times a day (BID) | INTRAVENOUS | Status: DC
  Administered 2016-12-17 – 2016-12-18 (×2): 750 mg via INTRAVENOUS
  Filled 2016-12-17 (×4): qty 150

## 2016-12-17 MED ORDER — HYDRALAZINE HCL 20 MG/ML IJ SOLN
10.0000 mg | Freq: Four times a day (QID) | INTRAMUSCULAR | Status: DC | PRN
  Administered 2016-12-18 – 2016-12-28 (×9): 10 mg via INTRAVENOUS
  Filled 2016-12-17 (×10): qty 1

## 2016-12-17 MED ORDER — FLUOXETINE HCL 10 MG PO CAPS: 10.0000 mg | ORAL_CAPSULE | Freq: Every day | ORAL | Status: DC

## 2016-12-17 NOTE — Progress Notes (Signed)
[facility]                                                                                                                                                                                  Patient Demographics   Darius [name], is a 51 y.o. male, DOB - June 23, 1966, [id number]  Admit date - 12/13/2016   Admitting Physician Lance Coon, MD  Outpatient Primary MD for the patient is Pcp Not In System   LOS - 0  Subjective: Patient admitted with acute encephalopathy. He is awake but still very confused. And not able to communicate much. His mother is at bedside. His mother reports that patient is disabled and lives at home alone but normally he is awake alert and communicative. He does have HIV and she states that last was seen and [facility]. His viral load was undetectable. According to her he is been taking his meds.  The patient is awake but still confused, he is able to move but unable to follow commands.  Review of Systems:   CONSTITUTIONAL: Unable to provide  Vitals:   Vitals:   12/15/16 2347 12/16/16 0900 12/17/16 0035 12/17/16 0746  BP: 127/77 135/66 (!) 158/87 (!) 162/104  Pulse: 68 90 71 75  Resp: 18 20 19 18   Temp: 98.7 F (37.1 C) 98.7 F (37.1 C) 98.3 F (36.8 C) 99.9 F (37.7 C)  TempSrc:  Oral Axillary Oral  SpO2: 98% 100% 100% 97%  Weight:      Height:        Wt Readings from Last 3 Encounters:  12/14/16 141 lb 8 oz (64.2 kg)  04/30/16 145 lb (65.8 kg)     Intake/Output Summary (Last 24 hours) at 12/17/16 1340 Last data filed at 12/17/16 0400  Gross per 24 hour  Intake          2018.34 ml  Output                0 ml  Net          2018.34 ml    Physical Exam:   GENERAL: Pleasant-appearing in no apparent distress. He is confused HEAD, EYES, EARS, NOSE AND THROAT: Atraumatic, normocephalic. . Pupils equal and reactive to light. Sclerae anicteric. No conjunctival injection. No  oro-pharyngeal erythema.  NECK: Supple. There is no jugular venous distention. No bruits, no lymphadenopathy, no thyromegaly.  HEART: Regular rate and rhythm,. No murmurs, no rubs, no clicks.  LUNGS: Clear to auscultation bilaterally. No rales or rhonchi. No wheezes.  ABDOMEN: Soft, flat, nontender, nondistended. Has good bowel sounds. No hepatosplenomegaly appreciated.  EXTREMITIES: No evidence of any cyanosis, clubbing,  or peripheral edema.  +2 pedal and radial pulses bilaterally.  NEUROLOGIC: The patient is confused, unable to exam. SKIN: Moist and warm with no rashes appreciated.  Psych: Not anxious, depressed LN: No inguinal LN enlargement    Antibiotics   Anti-infectives    Start     Dose/Rate Route Frequency Ordered Stop   12/17/16 1800  vancomycin (VANCOCIN) IVPB 750 mg/150 ml premix     750 mg 150 mL/hr over 60 Minutes Intravenous Every 12 hours 12/17/16 1122     12/16/16 1200  fluconazole (DIFLUCAN) IVPB 100 mg     100 mg 50 mL/hr over 60 Minutes Intravenous Every 24 hours 12/16/16 1140 12/19/16 1159   12/15/16 1630  vancomycin (VANCOCIN) IVPB 1000 mg/200 mL premix  Status:  Discontinued     1,000 mg 200 mL/hr over 60 Minutes Intravenous Every 12 hours 12/15/16 0522 12/15/16 0920   12/15/16 1630  vancomycin (VANCOCIN) IVPB 750 mg/150 ml premix  Status:  Discontinued     750 mg 150 mL/hr over 60 Minutes Intravenous Every 12 hours 12/15/16 0920 12/15/16 0930   12/15/16 1630  vancomycin (VANCOCIN) 750 mg in sodium chloride 0.9 % 150 mL IVPB  Status:  Discontinued     750 mg 150 mL/hr over 60 Minutes Intravenous Every 12 hours 12/15/16 0930 12/17/16 1122   12/15/16 0530  vancomycin (VANCOCIN) 250 mg in sodium chloride 0.9 % 100 mL IVPB  Status:  Discontinued     250 mg 100 mL/hr over 60 Minutes Intravenous  Once 12/15/16 0523 12/15/16 0525   12/14/16 1430  cefTRIAXone (ROCEPHIN) 2 g in dextrose 5 % 50 mL IVPB     2 g 100 mL/hr over 30 Minutes Intravenous Every 12 hours  12/14/16 1426     12/14/16 1000  abacavir-dolutegravir-lamiVUDine (TRIUMEQ) 600-50-300 MG per tablet 1 tablet     1 tablet Oral Daily 12/14/16 0155     12/14/16 0600  piperacillin-tazobactam (ZOSYN) IVPB 3.375 g  Status:  Discontinued     3.375 g 12.5 mL/hr over 240 Minutes Intravenous Every 8 hours 12/13/16 2232 12/14/16 1426   12/14/16 0430  vancomycin (VANCOCIN) IVPB 750 mg/150 ml premix  Status:  Discontinued     750 mg 150 mL/hr over 60 Minutes Intravenous Every 12 hours 12/13/16 2232 12/15/16 0522   12/13/16 2215  piperacillin-tazobactam (ZOSYN) IVPB 3.375 g     3.375 g 100 mL/hr over 30 Minutes Intravenous  Once 12/13/16 2212 12/13/16 2310   12/13/16 2215  vancomycin (VANCOCIN) IVPB 1000 mg/200 mL premix     1,000 mg 200 mL/hr over 60 Minutes Intravenous  Once 12/13/16 2212 12/13/16 2325      Medications   Scheduled Meds: . abacavir-dolutegravir-lamiVUDine  1 tablet Oral Daily  . cefTRIAXone (ROCEPHIN)  IV  2 g Intravenous Q12H  . chlorhexidine  15 mL Mouth Rinse BID  . enoxaparin (LOVENOX) injection  40 mg Subcutaneous Q24H  . fluconazole (DIFLUCAN) IV  100 mg Intravenous Q24H  . FLUoxetine  10 mg Oral Daily  . levETIRAcetam  750 mg Intravenous Q12H  . levothyroxine  25 mcg Intravenous Daily  . lidocaine (PF)  5 mL Other Once  . mouth rinse  15 mL Mouth Rinse q12n4p  . pneumococcal 23 valent vaccine  0.5 mL Intramuscular Tomorrow-1000  . vancomycin  750 mg Intravenous Q12H   Continuous Infusions: . 0.9 % NaCl with KCl 20 mEq / L 100 mL/hr (12/17/16 0233)   PRN Meds:.acetaminophen **OR** acetaminophen, ondansetron **OR** ondansetron (ZOFRAN)  IV   Data Review:   Micro Results Recent Results (from the past 240 hour(s))  Blood Culture (routine x 2)     Status: None (Preliminary result)   Collection Time: 12/13/16  8:13 PM  Result Value Ref Range Status   Specimen Description BLOOD R ARM  Final   Special Requests BOTTLES DRAWN AEROBIC AND ANAEROBIC [place]  Final    Culture NO GROWTH 4 DAYS  Final   Report Status PENDING  Incomplete  Urine culture     Status: Abnormal   Collection Time: 12/13/16  8:13 PM  Result Value Ref Range Status   Specimen Description URINE, RANDOM  Final   Special Requests NONE  Final   Culture (A)  Final    >=100,000 COLONIES/mL METHICILLIN RESISTANT STAPHYLOCOCCUS AUREUS   Report Status 12/16/2016 FINAL  Final   Organism ID, Bacteria METHICILLIN RESISTANT STAPHYLOCOCCUS AUREUS (A)  Final      Susceptibility   Methicillin resistant staphylococcus aureus - MIC*    CIPROFLOXACIN >=8 RESISTANT Resistant     GENTAMICIN >=16 RESISTANT Resistant     NITROFURANTOIN <=16 SENSITIVE Sensitive     OXACILLIN >=4 RESISTANT Resistant     TETRACYCLINE <=1 SENSITIVE Sensitive     VANCOMYCIN 1 SENSITIVE Sensitive     TRIMETH/SULFA <=10 SENSITIVE Sensitive     CLINDAMYCIN >=8 RESISTANT Resistant     RIFAMPIN <=0.5 SENSITIVE Sensitive     Inducible Clindamycin NEGATIVE Sensitive     * >=100,000 COLONIES/mL METHICILLIN RESISTANT STAPHYLOCOCCUS AUREUS  Blood Culture (routine x 2)     Status: None (Preliminary result)   Collection Time: 12/13/16 10:24 PM  Result Value Ref Range Status   Specimen Description BLOOD  R ARM  Final   Special Requests BOTTLES DRAWN AEROBIC AND ANAEROBIC  BCAV  Final   Culture NO GROWTH 4 DAYS  Final   Report Status PENDING  Incomplete  CSF culture     Status: None (Preliminary result)   Collection Time: 12/15/16 11:30 AM  Result Value Ref Range Status   Specimen Description CSF  Final   Special Requests NONE  Final   Gram Stain   Final    MODERATE RED BLOOD CELLS PRESENT RARE WBC SEEN NO ORGANISMS SEEN    Culture   Final    NO GROWTH < 24 HOURS Performed at [facility], [address], [place], [place] [address]    Report Status PENDING  Incomplete  Culture, fungus without smear     Status: None (Preliminary result)   Collection Time: 12/15/16 11:30 AM  Result Value Ref Range Status    Specimen Description CSF  Final   Special Requests NONE  Final   Culture   Final    NO FUNGUS ISOLATED AFTER 1 DAY Performed at [facility], [place] [address], [place], [place] [address]    Report Status PENDING  Incomplete    Radiology Reports Ct Head Wo Contrast  Result Date: 12/13/2016 CLINICAL DATA:  Acute onset of generalized weakness. Patient not speaking. Initial encounter. EXAM: CT HEAD WITHOUT CONTRAST TECHNIQUE: Contiguous axial images were obtained from the base of the skull through the vertex without intravenous contrast. COMPARISON:  CT of the head performed 04/29/2016 FINDINGS: Brain: No evidence of acute infarction, hemorrhage, hydrocephalus, extra-axial collection or mass lesion/mass effect. Prominence of the ventricles and sulci reflects moderate cortical volume loss. Mild cerebellar atrophy is noted. Scattered periventricular and subcortical white matter change likely reflects small vessel ischemic microangiopathy. Chronic encephalomalacia  is noted at the left basal ganglia, and there is ex vacuo dilatation of the left lateral ventricle. Calcification is seen at the pons. No mass effect or midline shift is seen. Chronic subdural thickening is noted on the left side. Vascular: No hyperdense vessel or unexpected calcification. Skull: There is no evidence of fracture; the patient is status post left frontoparietal craniotomy. Sinuses/Orbits: The visualized portions of the orbits are within normal limits. The paranasal sinuses and mastoid air cells are well-aerated. Other: No significant soft tissue abnormalities are seen. IMPRESSION: 1. No acute intracranial pathology seen on CT. 2. Moderate cortical volume loss and scattered small vessel ischemic microangiopathy. 3. Chronic encephalomalacia at the left basal ganglia, with ex vacuo dilatation of the left lateral ventricle. Chronic subdural thickening on the left side. Electronically Signed   By: Garald Balding M.D.   On: 12/13/2016  23:02   Dg Chest Port 1 View  Result Date: 12/13/2016 CLINICAL DATA:  Sepsis EXAM: PORTABLE CHEST 1 VIEW COMPARISON:  04/29/2014 FINDINGS: Cardiomediastinal silhouette is stable. No infiltrate or pleural effusion. No pulmonary edema. Bony thorax is unremarkable. IMPRESSION: No active disease. Electronically Signed   By: Lahoma Crocker M.D.   On: 12/13/2016 21:41   Dg Fluoro Guided Loc Of Needle/cath Tip For Spinal Inject Lt  Result Date: 12/15/2016 CLINICAL DATA:  Abnormal mental status. EXAM: DIAGNOSTIC LUMBAR PUNCTURE UNDER FLUOROSCOPIC GUIDANCE FLUOROSCOPY TIME:  Fluoroscopy Time:  0 minutes 20 seconds Radiation Exposure Index (if provided by the fluoroscopic device): 5.4 mGy Number of Acquired Spot Images: 1 PROCEDURE: Patient unable to give consent. After discussing the risks and benefits of this procedure with the patient's mother informed consent was obtained. Back was sterilely prepped and draped. Local anesthesia administered 1% lidocaine. 22 gauge needle was advanced into the lumbar canal and clear 9 cc of clear CSF removed and sent for the requested labs. Opening pressure was 9 cm of water. Needle was removed. Hemostasis achieved. No complications. IMPRESSION: Successful fluoroscopically directed lumbar puncture. Electronically Signed   By: Marcello Moores  Register   On: 12/15/2016 12:40     CBC  Recent Labs Lab 12/13/16 2013 12/14/16 0923 12/14/16 1204  WBC 10.5 11.1* 9.2  HGB 14.1 13.4  --   HCT 42.2 41.4 41.2  PLT 191 157 182  MCV 99.9 99.9 100*  MCH 33.5 32.4 32.1  MCHC 33.5 32.5 32.3  RDW 15.3* 15.0* 14.8  LYMPHSABS 1.7  --  2.5  MONOABS 1.2*  --   --   EOSABS 0.0  --  0.0  BASOSABS 0.0  --  0.0    Chemistries   Recent Labs Lab 12/13/16 2013 12/14/16 0923 12/16/16 0600  NA 147* 147*  --   K 3.8 3.8  --   CL 110 112*  --   CO2 27 24  --   GLUCOSE 110* 91  --   BUN 21* 19  --   CREATININE 1.56* 1.47* 1.28*  CALCIUM 10.0 9.4  --   AST 50*  --   --   ALT 27  --   --    ALKPHOS 66  --   --   BILITOT 0.6  --   --    ------------------------------------------------------------------------------------------------------------------ estimated creatinine clearance is 64.1 mL/min (A) (by C-G formula based on SCr of 1.28 mg/dL (H)). ------------------------------------------------------------------------------------------------------------------ No results for input(s): HGBA1C in the last 72 hours. ------------------------------------------------------------------------------------------------------------------ No results for input(s): CHOL, HDL, LDLCALC, TRIG, CHOLHDL, LDLDIRECT in the last 72 hours. ------------------------------------------------------------------------------------------------------------------  Recent Labs  12/14/16 1503  TSH 1.468   ------------------------------------------------------------------------------------------------------------------ No results for input(s): VITAMINB12, FOLATE, FERRITIN, TIBC, IRON, RETICCTPCT in the last 72 hours.  Coagulation profile  Recent Labs Lab 12/15/16 0410  INR 1.23    No results for input(s): DDIMER in the last 72 hours.  Cardiac Enzymes No results for input(s): CKMB, TROPONINI, MYOGLOBIN in the last 168 hours.  Invalid input(s): CK ------------------------------------------------------------------------------------------------------------------ Invalid input(s): [place]  Patient is a 51 year old with HIV with acute mental status changes  1. Acute encephalopathy: Differential include infectious or drug-induced. Ammonia is slightly abnormal not the cause for his encephalopathy.  Per Dr. Ola Spurr, Check serum crypto, RPR, TSH: Unremarkable.  LP done,  routine labs plus Crypto ag and CSF VDRL: negative. Follow-up culture. Blood cultures negative. Urinalysis is normal but the urine culture show MRSA. cont vanco, changed zosyn to ceftraixone 2 gm q 12 Per Dr.  Doy Mince, On [place].  EEG shows no evidence of electrographic seizure.  Initially not suggestive of infection.  Cryptococcal antigen negative. 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 Mental status is a little better. Try speech study. NPO with IVF. Psych consult. Per Dr. 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 History of seizure disorder currently on IV Keppra  3. Hypothyroidism continue Synthroid, changed to IV.  4. HIV continue HIV meds. 5.DVT prophylaxis, resumed Lovenox.  CKD stage II. Stable.  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    Code Status Orders        Start     Ordered   12/14/16 0156  Full code  Continuous     12/14/16 0155    Code Status History    Date Active Date Inactive Code Status Order ID Comments User Context   04/30/2016 12:53 AM 04/30/2016  5:45 PM Full Code [id number]  Lance Coon, MD Inpatient        * Discussed the case with pt's mother and Dr. Doy Mince.  Consults  Id ,neuro DVT Prophylaxis  scd's   Lab Results  Component Value Date   PLT 182 12/14/2016     Time Spent in minutes   28 min  Greater than 50% of time spent in care coordination and counseling patient regarding the condition and plan of care.   Demetrios Loll M.D on 12/17/2016 at 1:40 PM  Between 7am to 6pm - Pager - [phone]  After 6pm go to [email or web address] - password [email or web address] [facility]   Office  [phone]

## 2016-12-17 NOTE — Progress Notes (Signed)
Subjective: Patient tried to tell me his name but not following commands.    Objective: Current vital signs: BP (!) 162/104 (BP Location: Left Arm)   Pulse 75   Temp 99.9 F (37.7 C) (Oral)   Resp 18   Ht 6' (1.829 m)   Wt 64.2 kg (141 lb 8 oz)   SpO2 97%   BMI 19.19 kg/m  Vital signs in last 24 hours: Temp:  [98.3 F (36.8 C)-99.9 F (37.7 C)] 99.9 F (37.7 C) (03/31 0746) Pulse Rate:  [71-75] 75 (03/31 0746) Resp:  [18-19] 18 (03/31 0746) BP: (158-162)/(87-104) 162/104 (03/31 0746) SpO2:  [97 %-100 %] 97 % (03/31 0746)  Intake/Output from previous day: 03/30 0701 - 03/31 0700 In: 2068.3 [I.V.:1548.3; IV Piggyback:520] Out: -  Intake/Output this shift: No intake/output data recorded. Nutritional status: Diet NPO time specified Except for: Sips with Meds  Neurologic Exam: Mental Status: Eyes open.  No verbal response noted.  Does not answer any orientation questions.  Does not follow commands.  Questionable left neglect Cranial Nerves: II: Pupils equal, round, reactive to light and accommodation III,IV, VI: ptosis not present, extra-ocular motions intact bilaterally V,VII: Face symmetric VIII: hearing normal bilaterally IX,X: unable to test XI: unable to test XII: unable to test Motor: Moves all extremities weakly spontaneously against gravity Sensory: Responds to noxious stimuli in all extremities   Lab Results: Basic Metabolic Panel:  Recent Labs Lab 12/13/16 2013 12/14/16 0923 12/16/16 0600  NA 147* 147*  --   K 3.8 3.8  --   CL 110 112*  --   CO2 27 24  --   GLUCOSE 110* 91  --   BUN 21* 19  --   CREATININE 1.56* 1.47* 1.28*  CALCIUM 10.0 9.4  --     Liver Function Tests:  Recent Labs Lab 12/13/16 2013  AST 50*  ALT 27  ALKPHOS 66  BILITOT 0.6  PROT 10.5*  ALBUMIN 4.9    Recent Labs Lab 12/13/16 2013  LIPASE <10*    Recent Labs Lab 12/13/16 2057  AMMONIA 37*    CBC:  Recent Labs Lab 12/13/16 2013 12/14/16 0923  12/14/16 1204  WBC 10.5 11.1* 9.2  NEUTROABS 7.5*  --  5.5  HGB 14.1 13.4  --   HCT 42.2 41.4 41.2  MCV 99.9 99.9 100*  PLT 191 157 182    Cardiac Enzymes: No results for input(s): CKTOTAL, CKMB, CKMBINDEX, TROPONINI in the last 168 hours.  Lipid Panel: No results for input(s): CHOL, TRIG, HDL, CHOLHDL, VLDL, LDLCALC in the last 168 hours.  CBG: No results for input(s): GLUCAP in the last 168 hours.  Microbiology: Results for orders placed or performed during the hospital encounter of 12/13/16  Blood Culture (routine x 2)     Status: None (Preliminary result)   Collection Time: 12/13/16  8:13 PM  Result Value Ref Range Status   Specimen Description BLOOD R ARM  Final   Special Requests BOTTLES DRAWN AEROBIC AND ANAEROBIC [place]  Final   Culture NO GROWTH 4 DAYS  Final   Report Status PENDING  Incomplete  Urine culture     Status: Abnormal   Collection Time: 12/13/16  8:13 PM  Result Value Ref Range Status   Specimen Description URINE, RANDOM  Final   Special Requests NONE  Final   Culture (A)  Final    >=100,000 COLONIES/mL METHICILLIN RESISTANT STAPHYLOCOCCUS AUREUS   Report Status 12/16/2016 FINAL  Final   Organism ID, Bacteria METHICILLIN  RESISTANT STAPHYLOCOCCUS AUREUS (A)  Final      Susceptibility   Methicillin resistant staphylococcus aureus - MIC*    CIPROFLOXACIN >=8 RESISTANT Resistant     GENTAMICIN >=16 RESISTANT Resistant     NITROFURANTOIN <=16 SENSITIVE Sensitive     OXACILLIN >=4 RESISTANT Resistant     TETRACYCLINE <=1 SENSITIVE Sensitive     VANCOMYCIN 1 SENSITIVE Sensitive     TRIMETH/SULFA <=10 SENSITIVE Sensitive     CLINDAMYCIN >=8 RESISTANT Resistant     RIFAMPIN <=0.5 SENSITIVE Sensitive     Inducible Clindamycin NEGATIVE Sensitive     * >=100,000 COLONIES/mL METHICILLIN RESISTANT STAPHYLOCOCCUS AUREUS  Blood Culture (routine x 2)     Status: None (Preliminary result)   Collection Time: 12/13/16 10:24 PM  Result Value Ref Range Status    Specimen Description BLOOD  R ARM  Final   Special Requests BOTTLES DRAWN AEROBIC AND ANAEROBIC  BCAV  Final   Culture NO GROWTH 4 DAYS  Final   Report Status PENDING  Incomplete  CSF culture     Status: None (Preliminary result)   Collection Time: 12/15/16 11:30 AM  Result Value Ref Range Status   Specimen Description CSF  Final   Special Requests NONE  Final   Gram Stain   Final    MODERATE RED BLOOD CELLS PRESENT RARE WBC SEEN NO ORGANISMS SEEN    Culture   Final    NO GROWTH < 24 HOURS Performed at [facility], [address] [place], [place] [address]    Report Status PENDING  Incomplete  Culture, fungus without smear     Status: None (Preliminary result)   Collection Time: 12/15/16 11:30 AM  Result Value Ref Range Status   Specimen Description CSF  Final   Special Requests NONE  Final   Culture   Final    NO FUNGUS ISOLATED AFTER 1 DAY Performed at [facility], [place] [address] [place], [place] [address]    Report Status PENDING  Incomplete    Coagulation Studies:  Recent Labs  12/15/16 0410  LABPROT 15.6*  INR 1.23    Imaging: No results found.  Medications:  I have reviewed the patient's current medications. Scheduled: . abacavir-dolutegravir-lamiVUDine  1 tablet Oral Daily  . cefTRIAXone (ROCEPHIN)  IV  2 g Intravenous Q12H  . chlorhexidine  15 mL Mouth Rinse BID  . enoxaparin (LOVENOX) injection  40 mg Subcutaneous Q24H  . fluconazole (DIFLUCAN) IV  100 mg Intravenous Q24H  . levETIRAcetam  1,000 mg Intravenous Q12H  . levothyroxine  25 mcg Intravenous Daily  . lidocaine (PF)  5 mL Other Once  . mouth rinse  15 mL Mouth Rinse q12n4p  . pneumococcal 23 valent vaccine  0.5 mL Intramuscular Tomorrow-1000  . vancomycin  750 mg Intravenous Q12H    Assessment/Plan: Patient unchanged.  On Keppra.  EEG shows no evidence of electrographic seizure.  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

## 2016-12-17 NOTE — Progress Notes (Signed)
Dr Bridgett Larsson paged, waiting on callback. BP elevated.

## 2016-12-17 NOTE — Progress Notes (Signed)
BP elevated. Dr Bridgett Larsson paged, waiting on callback.

## 2016-12-17 NOTE — Evaluation (Signed)
SLP Cancellation Note  Patient Details Name: Darius [name] MRN: [id number] DOB: June 25, 1966   Cancelled treatment:     SLP attempted to complete BSE. Pt was unable to follow directions and pushed slp hand away when attempting to feed trials. Pt turned head x 2  When trials attempted. ST will continue to follow up to attempt to start pt on diet. Recommended to continue NPO due to level of consciousness.                                                                                                 [place] Sauber 12/17/2016, 9:37 AM

## 2016-12-18 LAB — CBC
HCT: 37.7 % — ABNORMAL LOW (ref 40.0–52.0)
Hemoglobin: 12.6 g/dL — ABNORMAL LOW (ref 13.0–18.0)
MCH: 33.4 pg (ref 26.0–34.0)
MCHC: 33.5 g/dL (ref 32.0–36.0)
MCV: 99.8 fL (ref 80.0–100.0)
PLATELETS: 150 10*3/uL (ref 150–440)
RBC: 3.78 MIL/uL — ABNORMAL LOW (ref 4.40–5.90)
RDW: 14 % (ref 11.5–14.5)
WBC: 7.7 10*3/uL (ref 3.8–10.6)

## 2016-12-18 LAB — BASIC METABOLIC PANEL
Anion gap: 10 (ref 5–15)
BUN: 15 mg/dL (ref 6–20)
CO2: 20 mmol/L — ABNORMAL LOW (ref 22–32)
CREATININE: 1.17 mg/dL (ref 0.61–1.24)
Calcium: 8.7 mg/dL — ABNORMAL LOW (ref 8.9–10.3)
Chloride: 112 mmol/L — ABNORMAL HIGH (ref 101–111)
GFR calc Af Amer: >60 mL/min (ref >60)
Glucose, Bld: 80 mg/dL (ref 65–99)
POTASSIUM: 3.9 mmol/L (ref 3.5–5.1)
SODIUM: 142 mmol/L (ref 135–145)

## 2016-12-18 LAB — CULTURE, BLOOD (ROUTINE X 2)
Culture: NO GROWTH
Culture: NO GROWTH

## 2016-12-18 MED ORDER — VANCOMYCIN HCL 1000 MG IV SOLR
750.0000 mg | Freq: Two times a day (BID) | INTRAVENOUS | Status: DC
  Administered 2016-12-18 – 2016-12-19 (×2): 750 mg via INTRAVENOUS
  Filled 2016-12-18 (×4): qty 750

## 2016-12-18 MED ORDER — AMLODIPINE BESYLATE 10 MG PO TABS: 10.0000 mg | ORAL_TABLET | Freq: Every day | ORAL | Status: DC

## 2016-12-18 MED ORDER — MODAFINIL 100 MG PO TABS: 100.0000 mg | ORAL_TABLET | Freq: Every day | ORAL | Status: DC

## 2016-12-18 NOTE — Progress Notes (Addendum)
[place] at [facility]                                                                                                                                                                                  Patient Demographics   Darius [name], is a 51 y.o. male, DOB - 1966-03-03, BWI:[id number]  Admit date - 12/13/2016   Admitting Physician Lance Coon, MD  Outpatient Primary MD for the patient is Pcp Not In System   LOS - 0  Subjective: Patient admitted with acute encephalopathy. He is awake but still very confused. And not able to communicate much. His mother is at bedside. His mother reports that patient is disabled and lives at home alone but normally he is awake alert and communicative. He does have HIV and she states that last was seen and [facility]. His viral load was undetectable. According to her he is been taking his meds.  The patient is awake with eyes opened, flat face, Limited reaction to verbal stimuli. he is able to move arems but unable to follow commands.  Review of Systems:   CONSTITUTIONAL: Unable to provide  Vitals:   Vitals:   12/18/16 0014 12/18/16 0100 12/18/16 0727 12/18/16 0825  BP: (!) 171/102 (!) 160/100 (!) 165/116 (!) 147/88  Pulse: 68  85 69  Resp: 16  17   Temp: 99.1 F (37.3 C)  98.8 F (37.1 C)   TempSrc: Axillary  Oral   SpO2: 99%  93%   Weight:      Height:        Wt Readings from Last 3 Encounters:  12/14/16 141 lb 8 oz (64.2 kg)  04/30/16 145 lb (65.8 kg)     Intake/Output Summary (Last 24 hours) at 12/18/16 1246 Last data filed at 12/18/16 0742  Gross per 24 hour  Intake          3424.17 ml  Output                0 ml  Net          3424.17 ml    Physical Exam:   GENERAL: Pleasant-appearing in no apparent distress. He is confused HEAD, EYES, EARS, NOSE AND THROAT: Atraumatic, normocephalic. . Pupils equal and reactive to light. Sclerae anicteric. No conjunctival injection. Oral  thrush. NECK: Supple. There is no jugular venous distention. No bruits, no lymphadenopathy, no thyromegaly.  HEART: Regular rate and rhythm,. No murmurs, no rubs, no clicks.  LUNGS: Clear to auscultation bilaterally. No rales or rhonchi. No wheezes.  ABDOMEN: Soft, flat, nontender, nondistended. Has good bowel sounds. No hepatosplenomegaly appreciated.  EXTREMITIES: No evidence of any  cyanosis, clubbing, or peripheral edema.  +2 pedal and radial pulses bilaterally.  NEUROLOGIC: The patient is confused, unable to exam. SKIN: Moist and warm with no rashes appreciated.  Psych: Not anxious, depressed LN: No inguinal LN enlargement    Antibiotics   Anti-infectives    Start     Dose/Rate Route Frequency Ordered Stop   12/18/16 1800  vancomycin (VANCOCIN) 750 mg in dextrose 5 % 150 mL IVPB     750 mg 150 mL/hr over 60 Minutes Intravenous Every 12 hours 12/18/16 0902     12/17/16 1800  vancomycin (VANCOCIN) IVPB 750 mg/150 ml premix  Status:  Discontinued     750 mg 150 mL/hr over 60 Minutes Intravenous Every 12 hours 12/17/16 1122 12/18/16 0853   12/16/16 1200  fluconazole (DIFLUCAN) IVPB 100 mg     100 mg 50 mL/hr over 60 Minutes Intravenous Every 24 hours 12/16/16 1140 12/19/16 1159   12/15/16 1630  vancomycin (VANCOCIN) IVPB 1000 mg/200 mL premix  Status:  Discontinued     1,000 mg 200 mL/hr over 60 Minutes Intravenous Every 12 hours 12/15/16 0522 12/15/16 0920   12/15/16 1630  vancomycin (VANCOCIN) IVPB 750 mg/150 ml premix  Status:  Discontinued     750 mg 150 mL/hr over 60 Minutes Intravenous Every 12 hours 12/15/16 0920 12/15/16 0930   12/15/16 1630  vancomycin (VANCOCIN) 750 mg in sodium chloride 0.9 % 150 mL IVPB  Status:  Discontinued     750 mg 150 mL/hr over 60 Minutes Intravenous Every 12 hours 12/15/16 0930 12/17/16 1122   12/15/16 0530  vancomycin (VANCOCIN) 250 mg in sodium chloride 0.9 % 100 mL IVPB  Status:  Discontinued     250 mg 100 mL/hr over 60 Minutes Intravenous   Once 12/15/16 0523 12/15/16 0525   12/14/16 1430  cefTRIAXone (ROCEPHIN) 2 g in dextrose 5 % 50 mL IVPB     2 g 100 mL/hr over 30 Minutes Intravenous Every 12 hours 12/14/16 1426     12/14/16 1000  abacavir-dolutegravir-lamiVUDine (TRIUMEQ) 600-50-300 MG per tablet 1 tablet     1 tablet Oral Daily 12/14/16 0155     12/14/16 0600  piperacillin-tazobactam (ZOSYN) IVPB 3.375 g  Status:  Discontinued     3.375 g 12.5 mL/hr over 240 Minutes Intravenous Every 8 hours 12/13/16 2232 12/14/16 1426   12/14/16 0430  vancomycin (VANCOCIN) IVPB 750 mg/150 ml premix  Status:  Discontinued     750 mg 150 mL/hr over 60 Minutes Intravenous Every 12 hours 12/13/16 2232 12/15/16 0522   12/13/16 2215  piperacillin-tazobactam (ZOSYN) IVPB 3.375 g     3.375 g 100 mL/hr over 30 Minutes Intravenous  Once 12/13/16 2212 12/13/16 2310   12/13/16 2215  vancomycin (VANCOCIN) IVPB 1000 mg/200 mL premix     1,000 mg 200 mL/hr over 60 Minutes Intravenous  Once 12/13/16 2212 12/13/16 2325      Medications   Scheduled Meds: . abacavir-dolutegravir-lamiVUDine  1 tablet Oral Daily  . amLODipine  10 mg Oral Daily  . cefTRIAXone (ROCEPHIN)  IV  2 g Intravenous Q12H  . chlorhexidine  15 mL Mouth Rinse BID  . enoxaparin (LOVENOX) injection  40 mg Subcutaneous Q24H  . fluconazole (DIFLUCAN) IV  100 mg Intravenous Q24H  . FLUoxetine  10 mg Oral Daily  . levETIRAcetam  750 mg Intravenous Q12H  . levothyroxine  25 mcg Intravenous Daily  . lidocaine (PF)  5 mL Other Once  . mouth rinse  15 mL Mouth  Rinse q12n4p  . modafinil  100 mg Oral Daily  . vancomycin (VANCOCIN) 750 mg/122mL IVPB  750 mg Intravenous Q12H   Continuous Infusions: . 0.9 % NaCl with KCl 20 mEq / L 50 mL/hr at 12/18/16 0729   PRN Meds:.acetaminophen **OR** acetaminophen, hydrALAZINE, ondansetron **OR** ondansetron (ZOFRAN) IV   Data Review:   Micro Results Recent Results (from the past 240 hour(s))  Blood Culture (routine x 2)     Status: None    Collection Time: 12/13/16  8:13 PM  Result Value Ref Range Status   Specimen Description BLOOD R ARM  Final   Special Requests BOTTLES DRAWN AEROBIC AND ANAEROBIC [place]  Final   Culture NO GROWTH 5 DAYS  Final   Report Status 12/18/2016 FINAL  Final  Urine culture     Status: Abnormal   Collection Time: 12/13/16  8:13 PM  Result Value Ref Range Status   Specimen Description URINE, RANDOM  Final   Special Requests NONE  Final   Culture (A)  Final    >=100,000 COLONIES/mL METHICILLIN RESISTANT STAPHYLOCOCCUS AUREUS   Report Status 12/16/2016 FINAL  Final   Organism ID, Bacteria METHICILLIN RESISTANT STAPHYLOCOCCUS AUREUS (A)  Final      Susceptibility   Methicillin resistant staphylococcus aureus - MIC*    CIPROFLOXACIN >=8 RESISTANT Resistant     GENTAMICIN >=16 RESISTANT Resistant     NITROFURANTOIN <=16 SENSITIVE Sensitive     OXACILLIN >=4 RESISTANT Resistant     TETRACYCLINE <=1 SENSITIVE Sensitive     VANCOMYCIN 1 SENSITIVE Sensitive     TRIMETH/SULFA <=10 SENSITIVE Sensitive     CLINDAMYCIN >=8 RESISTANT Resistant     RIFAMPIN <=0.5 SENSITIVE Sensitive     Inducible Clindamycin NEGATIVE Sensitive     * >=100,000 COLONIES/mL METHICILLIN RESISTANT STAPHYLOCOCCUS AUREUS  Blood Culture (routine x 2)     Status: None   Collection Time: 12/13/16 10:24 PM  Result Value Ref Range Status   Specimen Description BLOOD  R ARM  Final   Special Requests BOTTLES DRAWN AEROBIC AND ANAEROBIC  BCAV  Final   Culture NO GROWTH 5 DAYS  Final   Report Status 12/18/2016 FINAL  Final  CSF culture     Status: None (Preliminary result)   Collection Time: 12/15/16 11:30 AM  Result Value Ref Range Status   Specimen Description CSF  Final   Special Requests NONE  Final   Gram Stain   Final    MODERATE RED BLOOD CELLS PRESENT RARE WBC SEEN NO ORGANISMS SEEN    Culture   Final    NO GROWTH 2 DAYS Performed at [facility], [address] [place], [place] [address]    Report Status  PENDING  Incomplete  Culture, fungus without smear     Status: None (Preliminary result)   Collection Time: 12/15/16 11:30 AM  Result Value Ref Range Status   Specimen Description CSF  Final   Special Requests NONE  Final   Culture   Final    NO FUNGUS ISOLATED AFTER 1 DAY Performed at [facility], [place] [address], [place], [place] [address]    Report Status PENDING  Incomplete    Radiology Reports Ct Head Wo Contrast  Result Date: 12/13/2016 CLINICAL DATA:  Acute onset of generalized weakness. Patient not speaking. Initial encounter. EXAM: CT HEAD WITHOUT CONTRAST TECHNIQUE: Contiguous axial images were obtained from the base of the skull through the vertex without intravenous contrast. COMPARISON:  CT of the  head performed 04/29/2016 FINDINGS: Brain: No evidence of acute infarction, hemorrhage, hydrocephalus, extra-axial collection or mass lesion/mass effect. Prominence of the ventricles and sulci reflects moderate cortical volume loss. Mild cerebellar atrophy is noted. Scattered periventricular and subcortical white matter change likely reflects small vessel ischemic microangiopathy. Chronic encephalomalacia is noted at the left basal ganglia, and there is ex vacuo dilatation of the left lateral ventricle. Calcification is seen at the pons. No mass effect or midline shift is seen. Chronic subdural thickening is noted on the left side. Vascular: No hyperdense vessel or unexpected calcification. Skull: There is no evidence of fracture; the patient is status post left frontoparietal craniotomy. Sinuses/Orbits: The visualized portions of the orbits are within normal limits. The paranasal sinuses and mastoid air cells are well-aerated. Other: No significant soft tissue abnormalities are seen. IMPRESSION: 1. No acute intracranial pathology seen on CT. 2. Moderate cortical volume loss and scattered small vessel ischemic microangiopathy. 3. Chronic encephalomalacia at the left basal ganglia, with ex  vacuo dilatation of the left lateral ventricle. Chronic subdural thickening on the left side. Electronically Signed   By: Garald Balding M.D.   On: 12/13/2016 23:02   Dg Chest Port 1 View  Result Date: 12/13/2016 CLINICAL DATA:  Sepsis EXAM: PORTABLE CHEST 1 VIEW COMPARISON:  04/29/2014 FINDINGS: Cardiomediastinal silhouette is stable. No infiltrate or pleural effusion. No pulmonary edema. Bony thorax is unremarkable. IMPRESSION: No active disease. Electronically Signed   By: Lahoma Crocker M.D.   On: 12/13/2016 21:41   Dg Fluoro Guided Loc Of Needle/cath Tip For Spinal Inject Lt  Result Date: 12/15/2016 CLINICAL DATA:  Abnormal mental status. EXAM: DIAGNOSTIC LUMBAR PUNCTURE UNDER FLUOROSCOPIC GUIDANCE FLUOROSCOPY TIME:  Fluoroscopy Time:  0 minutes 20 seconds Radiation Exposure Index (if provided by the fluoroscopic device): 5.4 mGy Number of Acquired Spot Images: 1 PROCEDURE: Patient unable to give consent. After discussing the risks and benefits of this procedure with the patient's mother informed consent was obtained. Back was sterilely prepped and draped. Local anesthesia administered 1% lidocaine. 22 gauge needle was advanced into the lumbar canal and clear 9 cc of clear CSF removed and sent for the requested labs. Opening pressure was 9 cm of water. Needle was removed. Hemostasis achieved. No complications. IMPRESSION: Successful fluoroscopically directed lumbar puncture. Electronically Signed   By: Marcello Moores  Register   On: 12/15/2016 12:40     CBC  Recent Labs Lab 12/13/16 2013 12/14/16 0923 12/14/16 1204 12/18/16 0406  WBC 10.5 11.1* 9.2 7.7  HGB 14.1 13.4  --  12.6*  HCT 42.2 41.4 41.2 37.7*  PLT 191 157 182 150  MCV 99.9 99.9 100* 99.8  MCH 33.5 32.4 32.1 33.4  MCHC 33.5 32.5 32.3 33.5  RDW 15.3* 15.0* 14.8 14.0  LYMPHSABS 1.7  --  2.5  --   MONOABS 1.2*  --   --   --   EOSABS 0.0  --  0.0  --   BASOSABS 0.0  --  0.0  --     Chemistries   Recent Labs Lab 12/13/16 2013  12/14/16 0923 12/16/16 0600 12/18/16 0406  NA 147* 147*  --  142  K 3.8 3.8  --  3.9  CL 110 112*  --  112*  CO2 27 24  --  20*  GLUCOSE 110* 91  --  80  BUN 21* 19  --  15  CREATININE 1.56* 1.47* 1.28* 1.17  CALCIUM 10.0 9.4  --  8.7*  AST 50*  --   --   --  ALT 27  --   --   --   ALKPHOS 66  --   --   --   BILITOT 0.6  --   --   --    ------------------------------------------------------------------------------------------------------------------ estimated creatinine clearance is 70.1 mL/min (by C-G formula based on SCr of 1.17 mg/dL). ------------------------------------------------------------------------------------------------------------------ No results for input(s): HGBA1C in the last 72 hours. ------------------------------------------------------------------------------------------------------------------ No results for input(s): CHOL, HDL, LDLCALC, TRIG, CHOLHDL, LDLDIRECT in the last 72 hours. ------------------------------------------------------------------------------------------------------------------ No results for input(s): TSH, T4TOTAL, T3FREE, THYROIDAB in the last 72 hours.  Invalid input(s): FREET3 ------------------------------------------------------------------------------------------------------------------ No results for input(s): VITAMINB12, FOLATE, FERRITIN, TIBC, IRON, RETICCTPCT in the last 72 hours.  Coagulation profile  Recent Labs Lab 12/15/16 0410  INR 1.23    No results for input(s): DDIMER in the last 72 hours.  Cardiac Enzymes No results for input(s): CKMB, TROPONINI, MYOGLOBIN in the last 168 hours.  Invalid input(s): CK ------------------------------------------------------------------------------------------------------------------ Invalid input(s): [place]  Patient is a 51 year old with HIV with acute mental status changes  1. Acute encephalopathy: Differential include infectious or  drug-induced. Ammonia is slightly abnormal not the cause for his encephalopathy.  Per Dr. Ola Spurr, Check serum crypto, RPR, TSH: Unremarkable.  LP done,  routine labs plus Crypto ag and CSF VDRL: negative. Follow-up culture. Blood cultures negative. Urinalysis is normal but the urine culture show MRSA. cont vanco, changed zosyn to ceftraixone 2 gm q 12 Per Dr. Doy Mince, On [place].  EEG shows no evidence of electrographic seizure.  Initially not suggestive of infection.  Cryptococcal antigen negative. 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 Mental status is a little better. Tried speech screen but he aspirated per RN.  NPO with IVF. Psych consult. Per Dr. Macie Burows, Would consider doing another EEG and decreasing Keppra more but do not want to do it when EEG not available.  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 Prozac started yesterday. Started on Provigil today 100 daily which can lower seizure threshold.    2. History of seizure disorder currently on IV Keppra 750 mg bid.  3. Hypothyroidism continue Synthroid, changed to IV.  4. HIV continue HIV meds. 5.DVT prophylaxis, resumed Lovenox.  CKD stage II. Stable.  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  Oral thrush. Started Diflucan. Accelerated hypertension. Start Norvasc, on IV hydralazine when necessary.    Code Status Orders        Start     Ordered   12/14/16 0156  Full code  Continuous     12/14/16 0155    Code Status History    Date Active Date Inactive Code Status Order ID Comments User Context   04/30/2016 12:53 AM 04/30/2016  5:45 PM Full Code [id number]  Lance Coon, MD Inpatient      Consults  Id ,neuro DVT Prophylaxis  scd's   Lab Results  Component Value Date   PLT 150 12/18/2016     Time Spent in minutes   28 min  Greater than 50% of time spent in care coordination and counseling patient regarding the condition and plan of care.   Demetrios Loll M.D on 12/18/2016 at 12:46  PM  Between 7am to 6pm - Pager - [phone]  After 6pm go to [email or web address] - password [email or web address] [facility]   Office  [phone]

## 2016-12-18 NOTE — Clinical Social Work Note (Signed)
CSW received consult that patient lives alone and may need placement. CSW met with patient and his mother at bedside. His mother reported that the patient lives with her and is only alone during the day while she works. Up until finding him slumped at his desk, he was independent with ADLs and IADLs. The patient's mother is aware that he has a hx of cocaine and marijuana use. She also reported that she does not feel that he needs other care at this time, but she is open to the possibility if he does not progress. She did indicate that she has noticed him progressing gradually.   CSW provided emotional support to the patient's mother. CSW provided emotional support to the patient via hand holding and soothing speech. The patient seemed comforted during the discussion, and he was able to attempt speech, although it was garbled.   CSW involvement not needed at this time; however, CSW will alert via handoff to watch for needs.  Santiago Bumpers, MSW, Latanya Presser [phone]

## 2016-12-18 NOTE — Progress Notes (Signed)
Subjective: No change on examination Mask like face.    Objective: Current vital signs: BP (!) 147/88 (BP Location: Left Arm)   Pulse 69   Temp 98.8 F (37.1 C) (Oral)   Resp 17   Ht 6' (1.829 m)   Wt 64.2 kg (141 lb 8 oz)   SpO2 93%   BMI 19.19 kg/m  Vital signs in last 24 hours: Temp:  [98.8 F (37.1 C)-99.1 F (37.3 C)] 98.8 F (37.1 C) (04/01 0727) Pulse Rate:  [67-85] 69 (04/01 0825) Resp:  [16-17] 17 (04/01 0727) BP: (147-175)/(88-118) 147/88 (04/01 0825) SpO2:  [93 %-99 %] 93 % (04/01 0727)  Intake/Output from previous day: 03/31 0701 - 04/01 0700 In: 3065 [I.V.:2400; IV Piggyback:665] Out: -  Intake/Output this shift: Total I/O In: 359.2 [I.V.:359.2] Out: -  Nutritional status: Diet NPO time specified Except for: Sips with Meds  Neurologic Exam: Mental Status: Eyes open.  No verbal response noted.  Does not answer any orientation questions.  Does not follow commands.  Questionable left neglect Cranial Nerves: II: Pupils equal, round, reactive to light and accommodation III,IV, VI: ptosis not present, extra-ocular motions intact bilaterally V,VII: Face symmetric VIII: hearing normal bilaterally IX,X: unable to test XI: unable to test XII: unable to test Motor: Moves all extremities weakly spontaneously against gravity Sensory: Responds to noxious stimuli in all extremities   Lab Results: Basic Metabolic Panel:  Recent Labs Lab 12/13/16 2013 12/14/16 0923 12/16/16 0600 12/18/16 0406  NA 147* 147*  --  142  K 3.8 3.8  --  3.9  CL 110 112*  --  112*  CO2 27 24  --  20*  GLUCOSE 110* 91  --  80  BUN 21* 19  --  15  CREATININE 1.56* 1.47* 1.28* 1.17  CALCIUM 10.0 9.4  --  8.7*    Liver Function Tests:  Recent Labs Lab 12/13/16 2013  AST 50*  ALT 27  ALKPHOS 66  BILITOT 0.6  PROT 10.5*  ALBUMIN 4.9    Recent Labs Lab 12/13/16 2013  LIPASE <10*    Recent Labs Lab 12/13/16 2057  AMMONIA 37*    CBC:  Recent Labs Lab  12/13/16 2013 12/14/16 0923 12/14/16 1204 12/18/16 0406  WBC 10.5 11.1* 9.2 7.7  NEUTROABS 7.5*  --  5.5  --   HGB 14.1 13.4  --  12.6*  HCT 42.2 41.4 41.2 37.7*  MCV 99.9 99.9 100* 99.8  PLT 191 157 182 150    Cardiac Enzymes: No results for input(s): CKTOTAL, CKMB, CKMBINDEX, TROPONINI in the last 168 hours.  Lipid Panel: No results for input(s): CHOL, TRIG, HDL, CHOLHDL, VLDL, LDLCALC in the last 168 hours.  CBG: No results for input(s): GLUCAP in the last 168 hours.  Microbiology: Results for orders placed or performed during the hospital encounter of 12/13/16  Blood Culture (routine x 2)     Status: None   Collection Time: 12/13/16  8:13 PM  Result Value Ref Range Status   Specimen Description BLOOD R ARM  Final   Special Requests BOTTLES DRAWN AEROBIC AND ANAEROBIC [place]  Final   Culture NO GROWTH 5 DAYS  Final   Report Status 12/18/2016 FINAL  Final  Urine culture     Status: Abnormal   Collection Time: 12/13/16  8:13 PM  Result Value Ref Range Status   Specimen Description URINE, RANDOM  Final   Special Requests NONE  Final   Culture (A)  Final    >=100,000  COLONIES/mL METHICILLIN RESISTANT STAPHYLOCOCCUS AUREUS   Report Status 12/16/2016 FINAL  Final   Organism ID, Bacteria METHICILLIN RESISTANT STAPHYLOCOCCUS AUREUS (A)  Final      Susceptibility   Methicillin resistant staphylococcus aureus - MIC*    CIPROFLOXACIN >=8 RESISTANT Resistant     GENTAMICIN >=16 RESISTANT Resistant     NITROFURANTOIN <=16 SENSITIVE Sensitive     OXACILLIN >=4 RESISTANT Resistant     TETRACYCLINE <=1 SENSITIVE Sensitive     VANCOMYCIN 1 SENSITIVE Sensitive     TRIMETH/SULFA <=10 SENSITIVE Sensitive     CLINDAMYCIN >=8 RESISTANT Resistant     RIFAMPIN <=0.5 SENSITIVE Sensitive     Inducible Clindamycin NEGATIVE Sensitive     * >=100,000 COLONIES/mL METHICILLIN RESISTANT STAPHYLOCOCCUS AUREUS  Blood Culture (routine x 2)     Status: None   Collection Time: 12/13/16 10:24 PM   Result Value Ref Range Status   Specimen Description BLOOD  R ARM  Final   Special Requests BOTTLES DRAWN AEROBIC AND ANAEROBIC  BCAV  Final   Culture NO GROWTH 5 DAYS  Final   Report Status 12/18/2016 FINAL  Final  CSF culture     Status: None (Preliminary result)   Collection Time: 12/15/16 11:30 AM  Result Value Ref Range Status   Specimen Description CSF  Final   Special Requests NONE  Final   Gram Stain   Final    MODERATE RED BLOOD CELLS PRESENT RARE WBC SEEN NO ORGANISMS SEEN    Culture   Final    NO GROWTH 2 DAYS Performed at [facility], [address], [place], [place] [address]    Report Status PENDING  Incomplete  Culture, fungus without smear     Status: None (Preliminary result)   Collection Time: 12/15/16 11:30 AM  Result Value Ref Range Status   Specimen Description CSF  Final   Special Requests NONE  Final   Culture   Final    NO FUNGUS ISOLATED AFTER 1 DAY Performed at [facility], [place] [address] [place], [place] [address]    Report Status PENDING  Incomplete    Coagulation Studies: No results for input(s): LABPROT, INR in the last 72 hours.  Imaging: No results found.  Medications:  I have reviewed the patient's current medications. Scheduled: . abacavir-dolutegravir-lamiVUDine  1 tablet Oral Daily  . amLODipine  10 mg Oral Daily  . cefTRIAXone (ROCEPHIN)  IV  2 g Intravenous Q12H  . chlorhexidine  15 mL Mouth Rinse BID  . enoxaparin (LOVENOX) injection  40 mg Subcutaneous Q24H  . fluconazole (DIFLUCAN) IV  100 mg Intravenous Q24H  . FLUoxetine  10 mg Oral Daily  . levETIRAcetam  750 mg Intravenous Q12H  . levothyroxine  25 mcg Intravenous Daily  . lidocaine (PF)  5 mL Other Once  . mouth rinse  15 mL Mouth Rinse q12n4p  . vancomycin (VANCOCIN) 750 mg/140mL IVPB  750 mg Intravenous Q12H    Assessment/Plan: Patient unchanged.   On Keppra 750 BID.  EEG shows no evidence of electrographic seizure.  Pt has mask like face   Would consider doing another EEG and decreasing Keppra more but do not want to do it when EEG not available.  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   Prozac started yesterday.     12/18/2016  12:21 PM

## 2016-12-18 NOTE — Progress Notes (Signed)
Patient awake around 0330 stating, "I'm Darius [name]" over and over, unable to follow commands or answer questions. Other than that, patient only opened eyes to voice throughout shift.

## 2016-12-19 ENCOUNTER — Observation Stay: Payer: Medicare Other

## 2016-12-19 DIAGNOSIS — R4182 Altered mental status, unspecified: Secondary | ICD-10-CM | POA: Diagnosis not present

## 2016-12-19 DIAGNOSIS — R410 Disorientation, unspecified: Secondary | ICD-10-CM

## 2016-12-19 DIAGNOSIS — G9340 Encephalopathy, unspecified: Secondary | ICD-10-CM | POA: Diagnosis not present

## 2016-12-19 LAB — HELPER T-LYMPH-CD4 (ARMC ONLY)
% CD 4 Pos. Lymph.: 45.8 % (ref 30.8–58.5)
Absolute CD 4 Helper: 824 /uL (ref 359–1519)
BASOS ABS: 0 10*3/uL (ref 0.0–0.2)
Basos: 0 %
EOS (ABSOLUTE): 0.2 10*3/uL (ref 0.0–0.4)
Eos: 3 %
Hematocrit: 36.3 % — ABNORMAL LOW (ref 37.5–51.0)
Hemoglobin: 12.1 g/dL — ABNORMAL LOW (ref 13.0–17.7)
IMMATURE GRANULOCYTES: 0 %
Immature Grans (Abs): 0 10*3/uL (ref 0.0–0.1)
Lymphocytes Absolute: 1.8 10*3/uL (ref 0.7–3.1)
Lymphs: 24 %
MCH: 32.8 pg (ref 26.6–33.0)
MCHC: 33.3 g/dL (ref 31.5–35.7)
MCV: 98 fL — ABNORMAL HIGH (ref 79–97)
MONOS ABS: 0.8 10*3/uL (ref 0.1–0.9)
Monocytes: 10 %
NEUTROS PCT: 63 %
Neutrophils Absolute: 4.7 10*3/uL (ref 1.4–7.0)
PLATELETS: 180 10*3/uL (ref 150–379)
RBC: 3.69 x10E6/uL — ABNORMAL LOW (ref 4.14–5.80)
RDW: 14.1 % (ref 12.3–15.4)
WBC: 7.5 10*3/uL (ref 3.4–10.8)

## 2016-12-19 LAB — CSF CULTURE W GRAM STAIN: Culture: NO GROWTH

## 2016-12-19 LAB — CSF CULTURE

## 2016-12-19 LAB — VANCOMYCIN, TROUGH: Vancomycin Tr: 16 ug/mL (ref 15–20)

## 2016-12-19 NOTE — Progress Notes (Signed)
[place] at [facility]                                                                                                                                                                                  Patient Demographics   Darius [name], is a 52 y.o. male, DOB - 04-29-1966, TKZ:[id number]  Admit date - 12/13/2016   Admitting Physician Lance Coon, MD  Outpatient Primary MD for the patient is Pcp Not In System   LOS - 0  Subjective: Patient admitted with acute encephalopathy. He is awake but still very confused. And not able to communicate much. His mother is at bedside. His mother reports that patient is disabled and lives at home alone but normally he is awake alert and communicative. He does have HIV and she states that last was seen and [facility]. His viral load was undetectable. According to her he is been taking his meds.  The patient is awake with eyes blinked and stared, no reaction to verbal and pain stimuli.  Does not follow commands.  Review of Systems:   CONSTITUTIONAL: Unable to provide  Vitals:   Vitals:   12/19/16 0830 12/19/16 0856 12/19/16 0924 12/19/16 1509  BP: (!) 177/95 (!) 174/93 (!) 160/93 (!) 148/90  Pulse: 71  81 80  Resp: 20   18  Temp: 98.7 F (37.1 C)     TempSrc: Axillary   Axillary  SpO2: 96%   99%  Weight:      Height:        Wt Readings from Last 3 Encounters:  12/14/16 141 lb 8 oz (64.2 kg)  04/30/16 145 lb (65.8 kg)     Intake/Output Summary (Last 24 hours) at 12/19/16 1520 Last data filed at 12/19/16 0400  Gross per 24 hour  Intake             1480 ml  Output                0 ml  Net             1480 ml    Physical Exam:   GENERAL: Pleasant-appearing in no apparent distress. He is confused HEAD, EYES, EARS, NOSE AND THROAT: Atraumatic, normocephalic. . Pupils equal and reactive to light. Sclerae anicteric. No conjunctival injection. Oral thrush. NECK: Supple. There is no  jugular venous distention. No bruits, no lymphadenopathy, no thyromegaly.  HEART: Regular rate and rhythm,. No murmurs, no rubs, no clicks.  LUNGS: Clear to auscultation bilaterally. No rales or rhonchi. No wheezes.  ABDOMEN: Soft, flat, nontender, nondistended. Has good bowel sounds. No hepatosplenomegaly appreciated.  EXTREMITIES: No evidence of any cyanosis,  clubbing, or peripheral edema.  +2 pedal and radial pulses bilaterally.  NEUROLOGIC: The patient is awake with eyes blinked and stared, no reaction to verbal and pain stimuli.  Does not follow commands. SKIN: Moist and warm with no rashes appreciated.  Psych: Not anxious, depressed LN: No inguinal LN enlargement    Antibiotics   Anti-infectives    Start     Dose/Rate Route Frequency Ordered Stop   12/18/16 1800  vancomycin (VANCOCIN) 750 mg in dextrose 5 % 150 mL IVPB     750 mg 150 mL/hr over 60 Minutes Intravenous Every 12 hours 12/18/16 0902     12/17/16 1800  vancomycin (VANCOCIN) IVPB 750 mg/150 ml premix  Status:  Discontinued     750 mg 150 mL/hr over 60 Minutes Intravenous Every 12 hours 12/17/16 1122 12/18/16 0853   12/16/16 1200  fluconazole (DIFLUCAN) IVPB 100 mg     100 mg 50 mL/hr over 60 Minutes Intravenous Every 24 hours 12/16/16 1140 12/18/16 1254   12/15/16 1630  vancomycin (VANCOCIN) IVPB 1000 mg/200 mL premix  Status:  Discontinued     1,000 mg 200 mL/hr over 60 Minutes Intravenous Every 12 hours 12/15/16 0522 12/15/16 0920   12/15/16 1630  vancomycin (VANCOCIN) IVPB 750 mg/150 ml premix  Status:  Discontinued     750 mg 150 mL/hr over 60 Minutes Intravenous Every 12 hours 12/15/16 0920 12/15/16 0930   12/15/16 1630  vancomycin (VANCOCIN) 750 mg in sodium chloride 0.9 % 150 mL IVPB  Status:  Discontinued     750 mg 150 mL/hr over 60 Minutes Intravenous Every 12 hours 12/15/16 0930 12/17/16 1122   12/15/16 0530  vancomycin (VANCOCIN) 250 mg in sodium chloride 0.9 % 100 mL IVPB  Status:  Discontinued     250  mg 100 mL/hr over 60 Minutes Intravenous  Once 12/15/16 0523 12/15/16 0525   12/14/16 1430  cefTRIAXone (ROCEPHIN) 2 g in dextrose 5 % 50 mL IVPB     2 g 100 mL/hr over 30 Minutes Intravenous Every 12 hours 12/14/16 1426     12/14/16 1000  abacavir-dolutegravir-lamiVUDine (TRIUMEQ) 600-50-300 MG per tablet 1 tablet     1 tablet Oral Daily 12/14/16 0155     12/14/16 0600  piperacillin-tazobactam (ZOSYN) IVPB 3.375 g  Status:  Discontinued     3.375 g 12.5 mL/hr over 240 Minutes Intravenous Every 8 hours 12/13/16 2232 12/14/16 1426   12/14/16 0430  vancomycin (VANCOCIN) IVPB 750 mg/150 ml premix  Status:  Discontinued     750 mg 150 mL/hr over 60 Minutes Intravenous Every 12 hours 12/13/16 2232 12/15/16 0522   12/13/16 2215  piperacillin-tazobactam (ZOSYN) IVPB 3.375 g     3.375 g 100 mL/hr over 30 Minutes Intravenous  Once 12/13/16 2212 12/13/16 2310   12/13/16 2215  vancomycin (VANCOCIN) IVPB 1000 mg/200 mL premix     1,000 mg 200 mL/hr over 60 Minutes Intravenous  Once 12/13/16 2212 12/13/16 2325      Medications   Scheduled Meds: . abacavir-dolutegravir-lamiVUDine  1 tablet Oral Daily  . amLODipine  10 mg Oral Daily  . cefTRIAXone (ROCEPHIN)  IV  2 g Intravenous Q12H  . chlorhexidine  15 mL Mouth Rinse BID  . enoxaparin (LOVENOX) injection  40 mg Subcutaneous Q24H  . FLUoxetine  10 mg Oral Daily  . levETIRAcetam  750 mg Intravenous Q12H  . levothyroxine  25 mcg Intravenous Daily  . lidocaine (PF)  5 mL Other Once  . mouth rinse  15 mL Mouth Rinse q12n4p  . modafinil  100 mg Oral Daily  . vancomycin (VANCOCIN) 750 mg/12mL IVPB  750 mg Intravenous Q12H   Continuous Infusions: . 0.9 % NaCl with KCl 20 mEq / L 50 mL/hr at 12/19/16 1058   PRN Meds:.acetaminophen **OR** acetaminophen, hydrALAZINE, ondansetron **OR** ondansetron (ZOFRAN) IV   Data Review:   Micro Results Recent Results (from the past 240 hour(s))  Blood Culture (routine x 2)     Status: None   Collection  Time: 12/13/16  8:13 PM  Result Value Ref Range Status   Specimen Description BLOOD R ARM  Final   Special Requests BOTTLES DRAWN AEROBIC AND ANAEROBIC [place]  Final   Culture NO GROWTH 5 DAYS  Final   Report Status 12/18/2016 FINAL  Final  Urine culture     Status: Abnormal   Collection Time: 12/13/16  8:13 PM  Result Value Ref Range Status   Specimen Description URINE, RANDOM  Final   Special Requests NONE  Final   Culture (A)  Final    >=100,000 COLONIES/mL METHICILLIN RESISTANT STAPHYLOCOCCUS AUREUS   Report Status 12/16/2016 FINAL  Final   Organism ID, Bacteria METHICILLIN RESISTANT STAPHYLOCOCCUS AUREUS (A)  Final      Susceptibility   Methicillin resistant staphylococcus aureus - MIC*    CIPROFLOXACIN >=8 RESISTANT Resistant     GENTAMICIN >=16 RESISTANT Resistant     NITROFURANTOIN <=16 SENSITIVE Sensitive     OXACILLIN >=4 RESISTANT Resistant     TETRACYCLINE <=1 SENSITIVE Sensitive     VANCOMYCIN 1 SENSITIVE Sensitive     TRIMETH/SULFA <=10 SENSITIVE Sensitive     CLINDAMYCIN >=8 RESISTANT Resistant     RIFAMPIN <=0.5 SENSITIVE Sensitive     Inducible Clindamycin NEGATIVE Sensitive     * >=100,000 COLONIES/mL METHICILLIN RESISTANT STAPHYLOCOCCUS AUREUS  Blood Culture (routine x 2)     Status: None   Collection Time: 12/13/16 10:24 PM  Result Value Ref Range Status   Specimen Description BLOOD  R ARM  Final   Special Requests BOTTLES DRAWN AEROBIC AND ANAEROBIC  BCAV  Final   Culture NO GROWTH 5 DAYS  Final   Report Status 12/18/2016 FINAL  Final  CSF culture     Status: None   Collection Time: 12/15/16 11:30 AM  Result Value Ref Range Status   Specimen Description CSF  Final   Special Requests NONE  Final   Gram Stain   Final    MODERATE RED BLOOD CELLS PRESENT RARE WBC SEEN NO ORGANISMS SEEN    Culture   Final    NO GROWTH 3 DAYS Performed at [facility], [address], [place], [place] [address]    Report Status 12/19/2016 FINAL  Final  Culture,  fungus without smear     Status: None (Preliminary result)   Collection Time: 12/15/16 11:30 AM  Result Value Ref Range Status   Specimen Description CSF  Final   Special Requests NONE  Final   Culture   Final    NO FUNGUS ISOLATED AFTER 3 DAYS Performed at [facility], [place] [address], [place], [place] [address]    Report Status PENDING  Incomplete    Radiology Reports Ct Head Wo Contrast  Result Date: 12/13/2016 CLINICAL DATA:  Acute onset of generalized weakness. Patient not speaking. Initial encounter. EXAM: CT HEAD WITHOUT CONTRAST TECHNIQUE: Contiguous axial images were obtained from the base of the skull through the vertex without intravenous contrast. COMPARISON:  CT  of the head performed 04/29/2016 FINDINGS: Brain: No evidence of acute infarction, hemorrhage, hydrocephalus, extra-axial collection or mass lesion/mass effect. Prominence of the ventricles and sulci reflects moderate cortical volume loss. Mild cerebellar atrophy is noted. Scattered periventricular and subcortical white matter change likely reflects small vessel ischemic microangiopathy. Chronic encephalomalacia is noted at the left basal ganglia, and there is ex vacuo dilatation of the left lateral ventricle. Calcification is seen at the pons. No mass effect or midline shift is seen. Chronic subdural thickening is noted on the left side. Vascular: No hyperdense vessel or unexpected calcification. Skull: There is no evidence of fracture; the patient is status post left frontoparietal craniotomy. Sinuses/Orbits: The visualized portions of the orbits are within normal limits. The paranasal sinuses and mastoid air cells are well-aerated. Other: No significant soft tissue abnormalities are seen. IMPRESSION: 1. No acute intracranial pathology seen on CT. 2. Moderate cortical volume loss and scattered small vessel ischemic microangiopathy. 3. Chronic encephalomalacia at the left basal ganglia, with ex vacuo dilatation of the left  lateral ventricle. Chronic subdural thickening on the left side. Electronically Signed   By: Garald Balding M.D.   On: 12/13/2016 23:02   Dg Chest Port 1 View  Result Date: 12/13/2016 CLINICAL DATA:  Sepsis EXAM: PORTABLE CHEST 1 VIEW COMPARISON:  04/29/2014 FINDINGS: Cardiomediastinal silhouette is stable. No infiltrate or pleural effusion. No pulmonary edema. Bony thorax is unremarkable. IMPRESSION: No active disease. Electronically Signed   By: Lahoma Crocker M.D.   On: 12/13/2016 21:41   Dg Fluoro Guided Loc Of Needle/cath Tip For Spinal Inject Lt  Result Date: 12/15/2016 CLINICAL DATA:  Abnormal mental status. EXAM: DIAGNOSTIC LUMBAR PUNCTURE UNDER FLUOROSCOPIC GUIDANCE FLUOROSCOPY TIME:  Fluoroscopy Time:  0 minutes 20 seconds Radiation Exposure Index (if provided by the fluoroscopic device): 5.4 mGy Number of Acquired Spot Images: 1 PROCEDURE: Patient unable to give consent. After discussing the risks and benefits of this procedure with the patient's mother informed consent was obtained. Back was sterilely prepped and draped. Local anesthesia administered 1% lidocaine. 22 gauge needle was advanced into the lumbar canal and clear 9 cc of clear CSF removed and sent for the requested labs. Opening pressure was 9 cm of water. Needle was removed. Hemostasis achieved. No complications. IMPRESSION: Successful fluoroscopically directed lumbar puncture. Electronically Signed   By: Marcello Moores  Register   On: 12/15/2016 12:40     CBC  Recent Labs Lab 12/13/16 2013 12/14/16 0923 12/14/16 1204 12/18/16 0406  WBC 10.5 11.1* 9.2 7.7  HGB 14.1 13.4  --  12.6*  HCT 42.2 41.4 41.2 37.7*  PLT 191 157 182 150  MCV 99.9 99.9 100* 99.8  MCH 33.5 32.4 32.1 33.4  MCHC 33.5 32.5 32.3 33.5  RDW 15.3* 15.0* 14.8 14.0  LYMPHSABS 1.7  --  2.5  --   MONOABS 1.2*  --   --   --   EOSABS 0.0  --  0.0  --   BASOSABS 0.0  --  0.0  --     Chemistries   Recent Labs Lab 12/13/16 2013 12/14/16 0923 12/16/16 0600  12/18/16 0406  NA 147* 147*  --  142  K 3.8 3.8  --  3.9  CL 110 112*  --  112*  CO2 27 24  --  20*  GLUCOSE 110* 91  --  80  BUN 21* 19  --  15  CREATININE 1.56* 1.47* 1.28* 1.17  CALCIUM 10.0 9.4  --  8.7*  AST 50*  --   --   --  ALT 27  --   --   --   ALKPHOS 66  --   --   --   BILITOT 0.6  --   --   --    ------------------------------------------------------------------------------------------------------------------ estimated creatinine clearance is 70.1 mL/min (by C-G formula based on SCr of 1.17 mg/dL). ------------------------------------------------------------------------------------------------------------------ No results for input(s): HGBA1C in the last 72 hours. ------------------------------------------------------------------------------------------------------------------ No results for input(s): CHOL, HDL, LDLCALC, TRIG, CHOLHDL, LDLDIRECT in the last 72 hours. ------------------------------------------------------------------------------------------------------------------ No results for input(s): TSH, T4TOTAL, T3FREE, THYROIDAB in the last 72 hours.  Invalid input(s): FREET3 ------------------------------------------------------------------------------------------------------------------ No results for input(s): VITAMINB12, FOLATE, FERRITIN, TIBC, IRON, RETICCTPCT in the last 72 hours.  Coagulation profile  Recent Labs Lab 12/15/16 0410  INR 1.23    No results for input(s): DDIMER in the last 72 hours.  Cardiac Enzymes No results for input(s): CKMB, TROPONINI, MYOGLOBIN in the last 168 hours.  Invalid input(s): CK ------------------------------------------------------------------------------------------------------------------ Invalid input(s): [place]  Patient is a 51 year old with HIV with acute mental status changes  1. Acute encephalopathy: Ammonia is slightly abnormal not the cause for his encephalopathy.  Per Dr.  Ola Spurr, Check serum crypto, RPR, TSH: Unremarkable.  LP done,  routine labs plus Crypto ag and CSF VDRL: negative. Follow-up culture. Blood cultures negative. Urinalysis is normal but the urine culture show MRSA. cont vanco, changed zosyn to ceftraixone 2 gm q 12. Follow-up Dr. Ola Spurr for antibiotics at the adjustment.  Per Dr. Doy Mince, On [place].  EEG shows no evidence of electrographic seizure.  Initially not suggestive of infection.  Cryptococcal antigen negative. 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 Mental status is a little better. Tried speech screen but he aspirated per RN.  NPO with IVF. Reason for Psych consult. Per Dr. Macie Burows, Would consider doing another EEG and decreasing Keppra more but do not want to do it when EEG not available.  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 Prozac started. Started on Provigil today 100 daily which can lower seizure threshold.   The patient had seizure like activity this morning, will get EEG and follow-up with neurologist.  2. History of seizure disorder currently on IV Keppra 750 mg bid.  3. Hypothyroidism continue Synthroid, changed to IV.  4. HIV continue HIV meds. 5.DVT prophylaxis, resumed Lovenox.  CKD stage II. Stable.  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  Oral thrush. On iv Diflucan. Accelerated hypertension. Start Norvasc, on IV hydralazine when necessary.    Code Status Orders        Start     Ordered   12/14/16 0156  Full code  Continuous     12/14/16 0155    Code Status History    Date Active Date Inactive Code Status Order ID Comments User Context   04/30/2016 12:53 AM 04/30/2016  5:45 PM Full Code [id number]  Lance Coon, MD Inpatient      Consults  Id ,neuro DVT Prophylaxis  scd's   Lab Results  Component Value Date   PLT 150 12/18/2016     Time Spent in minutes   33 min  Greater than 50% of time spent in care coordination and counseling patient regarding the  condition and plan of care.   Demetrios Loll M.D on 12/19/2016 at 3:20 PM  Between 7am to 6pm - Pager - [phone]  After 6pm go to [email or web address] - password [email or web address] [facility]   Office  [phone]

## 2016-12-19 NOTE — Progress Notes (Signed)
Speech Language Pathology  Patient Details Name: Darius [name] MRN: [id number] DOB: [birth year] Today's Date: 12/19/2016    SLP consulted with nursing and pt to assess if pt is appropriate for treatment this date. NSG states pt continues to have lethargy and not participating with attempts for medications or interventions. Pt refused trials for ST. Treatment withheld.    [place] Sauber 12/19/2016, 1:34 PM

## 2016-12-19 NOTE — Consult Note (Signed)
Darius [name] Psychiatry Consult   Reason for Consult:  Consult for 51 year old man HIV-positive history of drug abuse currently in the hospital with altered mental status that is persistent. Referring Physician:  Bridgett Larsson Patient Identification: Darius [name] MRN:  [id number] Principal Diagnosis: Altered mental state Diagnosis:   Patient Active Problem List   Diagnosis Date Noted  . Acute encephalopathy [G93.40]   . 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 HIV (human immunodeficiency virus infection) ([place]) [B20] 04/29/2016  . Altered mental state [R41.82] 04/29/2016  . Drug abuse [F19.10] 04/29/2016  . Hypothyroidism [E03.9] 04/29/2016  . Seizures ([place]) [R56.9] 04/29/2016    Total Time spent with patient: 45 minutes  Subjective:   Darius [name] is a 51 y.o. male patient admitted with patient not able to give any information.  HPI:  Attempted to interview patient. Chart reviewed. Patient known to me from one prior encounter. 51 year old man with a history of HIV positive who was brought into the hospital with dose denies. Patient has not apparently returned to his baseline yet. On examination today I found the patient to be apparently awake but not really responding to me at all. Eyes were open and were deviated to the right as was all of his attention. Patient would not follow my finger with his eyes. Would not respond to 1 step commands. Did not speak. Unclear if he was paying attention to me at all. On admission drug screen positive for cocaine.  Social history: Lives with his mother. HIV positive.  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 Has a history of seizures as well. So far medical workup has not found any specific infection.  Substance abuse history: Known history of abuse of cocaine and possibly other drugs not able  to give any immediate history now.  Past Psychiatric History: Other than substance abuse no identified past psychiatric history. No history of hospitalization no history of suicide attempts or violence. I saw this patient back in August 2017 under similar circumstances although he was returning to his basement line more quickly. Nevertheless even at that point his baseline was quite impaired although he was able to communicate a little better than he is now.  Risk to Self: Is patient at risk for suicide?: No Risk to Others:   Prior Inpatient Therapy:   Prior Outpatient Therapy:    Past Medical History:  Past Medical History:  Diagnosis Date  . CKD (chronic kidney disease), stage III   . CNS lymphoma ([place])   . Drug abuse   . Hepatitis C   . HIV (human immunodeficiency virus infection) ([place])   . Hypothyroidism   . Seizures ([place])     Past Surgical History:  Procedure Laterality Date  . CRANIOTOMY     Family History:  Family History  Problem Relation Age of Onset  . Family history unknown: Yes   Family Psychiatric  History: Unknown Social History:  History  Alcohol Use No     History  Drug Use    Social History   Social History  . Marital status: Unknown    Spouse name: N/A  . Number of children: N/A  . Years of education: N/A   Social History Main Topics  . Smoking status: Current Every Day Smoker  . Smokeless tobacco: Never Used  . Alcohol use No  . Drug use: Yes  .  Sexual activity: Not Asked   Other Topics Concern  . None   Social History Narrative  . None   Additional Social History:    Allergies:   Allergies  Allergen Reactions  . Asa [Aspirin] Other (See Comments)    Reaction: unknown  . Sulfa Antibiotics Other (See Comments)    Reaction: unknown    Labs:  Results for orders placed or performed during the hospital encounter of 12/13/16 (from the past 48 hour(s))  Helper T-Lymph-CD4 [facility] only)     Status: Abnormal   Collection Time: 12/18/16   4:06 AM  Result Value Ref Range   Absolute CD 4 Helper 824 359 - 1,519 /uL   % CD 4 Pos. Lymph. 45.8 30.8 - 58.5 %   WBC 7.5 3.4 - 10.8 x10E3/uL   RBC 3.69 (L) 4.14 - 5.80 x10E6/uL   Hematocrit 36.3 (L) 37.5 - 51.0 %   MCV 98 (H) 79 - 97 fL   MCH 32.8 26.6 - 33.0 pg   MCHC 33.3 31.5 - 35.7 g/dL   RDW 14.1 12.3 - 15.4 %   Platelets 180 150 - 379 x10E3/uL   Neutrophils 63 Not Estab. %   Lymphs 24 Not Estab. %   Monocytes 10 Not Estab. %   Eos 3 Not Estab. %   Basos 0 Not Estab. %   Neutrophils Absolute 4.7 1.4 - 7.0 x10E3/uL   Lymphocytes Absolute 1.8 0.7 - 3.1 x10E3/uL   Monocytes Absolute 0.8 0.1 - 0.9 x10E3/uL   EOS (ABSOLUTE) 0.2 0.0 - 0.4 x10E3/uL   Basophils Absolute 0.0 0.0 - 0.2 x10E3/uL   Immature Granulocytes 0 Not Estab. %   Immature Grans (Abs) 0.0 0.0 - 0.1 x10E3/uL    Comment: (NOTE) Performed At: [facility] [place], [place] [id number] Lindon Romp MD IH:[id number]    Hemoglobin 12.1 (L) 13.0 - 17.7 g/dL  Basic metabolic panel     Status: Abnormal   Collection Time: 12/18/16  4:06 AM  Result Value Ref Range   Sodium 142 135 - 145 mmol/L   Potassium 3.9 3.5 - 5.1 mmol/L   Chloride 112 (H) 101 - 111 mmol/L   CO2 20 (L) 22 - 32 mmol/L   Glucose, Bld 80 65 - 99 mg/dL   BUN 15 6 - 20 mg/dL   Creatinine, Ser 1.17 0.61 - 1.24 mg/dL   Calcium 8.7 (L) 8.9 - 10.3 mg/dL   GFR calc non Af Amer >60 >60 mL/min   GFR calc Af Amer >60 >60 mL/min    Comment: (NOTE) The eGFR has been calculated using the CKD EPI equation. This calculation has not been validated in all clinical situations. eGFR's persistently <60 mL/min signify possible Chronic Kidney Disease.    Anion gap 10 5 - 15  CBC     Status: Abnormal   Collection Time: 12/18/16  4:06 AM  Result Value Ref Range   WBC 7.7 3.8 - 10.6 K/uL   RBC 3.78 (L) 4.40 - 5.90 MIL/uL   Hemoglobin 12.6 (L) 13.0 - 18.0 g/dL   HCT 37.7 (L) 40.0 - 52.0 %   MCV 99.8 80.0 - 100.0 fL   MCH 33.4 26.0 -  34.0 pg   MCHC 33.5 32.0 - 36.0 g/dL   RDW 14.0 11.5 - 14.5 %   Platelets 150 150 - 440 K/uL  Vancomycin, trough     Status: None   Collection Time: 12/19/16  5:32 AM  Result Value Ref Range  Vancomycin Tr 16 15 - 20 ug/mL    Current Facility-Administered Medications  Medication Dose Route Frequency Provider Last Rate Last Dose  . 0.9 % NaCl with KCl 20 mEq/ L  infusion   Intravenous Continuous Demetrios Loll, MD 50 mL/hr at 12/19/16 1058    . abacavir-dolutegravir-lamiVUDine (TRIUMEQ) 600-50-300 MG per tablet 1 tablet  1 tablet Oral Daily Lance Coon, MD      . acetaminophen (TYLENOL) tablet 650 mg  650 mg Oral Q6H PRN Lance Coon, MD       Or  . acetaminophen (TYLENOL) suppository 650 mg  650 mg Rectal Q6H PRN Lance Coon, MD   650 mg at 12/14/16 0845  . amLODipine (NORVASC) tablet 10 mg  10 mg Oral Daily Demetrios Loll, MD      . chlorhexidine (PERIDEX) 0.12 % solution 15 mL  15 mL Mouth Rinse BID Lance Coon, MD   15 mL at 12/19/16 2020  . enoxaparin (LOVENOX) injection 40 mg  40 mg Subcutaneous Q24H Demetrios Loll, MD   40 mg at 12/19/16 2019  . FLUoxetine (PROZAC) capsule 10 mg  10 mg Oral Daily Leotis Pain, MD      . hydrALAZINE (APRESOLINE) injection 10 mg  10 mg Intravenous Q6H PRN Vaughan Basta, MD   10 mg at 12/19/16 0845  . levETIRAcetam (KEPPRA) 750 mg in sodium chloride 0.9 % 100 mL IVPB  750 mg Intravenous Q12H Leotis Pain, MD   750 mg at 12/19/16 1503  . levothyroxine (SYNTHROID, LEVOTHROID) injection 25 mcg  25 mcg Intravenous Daily Demetrios Loll, MD   25 mcg at 12/19/16 0932  . lidocaine (PF) (XYLOCAINE) 1 % injection 5 mL  5 mL Other Once Dustin Flock, MD      . MEDLINE mouth rinse  15 mL Mouth Rinse q12n4p Lance Coon, MD   15 mL at 12/19/16 1149  . modafinil (PROVIGIL) tablet 100 mg  100 mg Oral Daily Leotis Pain, MD      . ondansetron [facility]) tablet 4 mg  4 mg Oral Q6H PRN Lance Coon, MD       Or  . ondansetron [facility]) injection 4 mg  4 mg Intravenous  Q6H PRN Lance Coon, MD        Musculoskeletal: Strength & Muscle Tone: decreased Gait & Station: unable to stand Patient leans: Backward  Psychiatric Specialty Exam: Physical Exam  Nursing note and vitals reviewed. Constitutional: He appears well-developed.  HENT:  Head: Normocephalic and atraumatic.  Eyes: Conjunctivae are normal. Pupils are equal, round, and reactive to light.  Neck: Normal range of motion.  Cardiovascular: Regular rhythm and normal heart sounds.   Respiratory: Effort normal.  GI: Soft.  Musculoskeletal: Normal range of motion.  Neurological: He is alert.  Eyes and attention deviated to the right. No spontaneous movement of his left upper extremity. Only slow and awkward movements of the right upper extremity.  Skin: Skin is warm and dry.  Psychiatric: His affect is blunt. He is withdrawn. He is noncommunicative.    Review of Systems  Unable to perform ROS: Mental status change    Blood pressure (!) 148/90, pulse 80, temperature 98.7 F (37.1 C), temperature source Axillary, resp. rate 18, height 6' (1.829 m), weight 64.2 kg (141 lb 8 oz), SpO2 99 %.Body mass index is 19.19 kg/m.  General Appearance: Disheveled  Eye Contact:  None  Speech:  Negative  Volume:  Decreased  Mood:  Negative  Affect:  Negative  Thought Process:  NA  Orientation:  Negative  Thought Content:  Negative  Suicidal Thoughts:  No  Homicidal Thoughts:  No  Memory:  Negative  Judgement:  Negative  Insight:  Negative  Psychomotor Activity:  NA  Concentration:  Concentration: Negative  Recall:  Negative  Fund of Knowledge:  Negative  Language:  Negative  Akathisia:  Negative  Handed:  Right  AIMS (if indicated):     Assets:  Financial Resources/Insurance Housing Social Support  ADL's:  Impaired  Cognition:  Impaired,  Severe  Sleep:        Treatment Plan Summary: Plan 51 year old man who is presenting with severe cognitive impairment. Not responsive to me right now.  At this point in his hospital stay direct effects of substance abuse are not likely to be part of the problem. No known history of abuse of any drugs that would have persistent withdrawal to account for this. No other past psychiatric history known. Most likely all of the mental status changes are not related to "psychiatric" but rather to "neurologic" illness. Right now he is not agitated. If agitation does become an issue modest doses of benzodiazepines might be more appropriate than antipsychotics given his seizure history although IV Haldol could be useful as well. I will defer to Dr. Doy Mince in further workup right now. I will try to follow up as needed.  Disposition: Patient does not meet criteria for psychiatric inpatient admission.  Alethia Berthold, MD 12/19/2016 9:12 PM

## 2016-12-19 NOTE — Progress Notes (Signed)
Pts BP 177/95. MD Bridgett Larsson at bedside and notified. PRN IV Hydralazine given.

## 2016-12-19 NOTE — Progress Notes (Signed)
Pharmacy Antibiotic Note  Jarl Sellitto a50 y.o.maleadmittedon 3/27/2018with sepsis. Pharmacy has been consulted for vancomycindosing.  Plan: VT = 17 mcg/mL (therapeutic) at steady state. Continue current regimen of vancomycin 750 mg IV q12h Goal VT 15-20 mcg/mL Pharmacy to follow renal function and determine when to recheck VT  4/2 VT @ 0532: 16 therapeutic, drawn appropriately, will continue current regimen and will redrawn next VT 4/4 @ 0400.   Height: 6' (182.9 cm) Weight: 150 lb (68 kg) IBW/kg (Calculated) : 77.6  Temp (24hrs), Avg:98.8 F (37.1 C), Min:98.8 F (37.1 C), Max:98.8 F (37.1 C)   Last Labs    Recent Labs Lab 12/13/16 2013 12/13/16 2057  WBC 10.5 --   CREATININE 1.56* --   LATICACIDVEN --  3.0*     Estimated Creatinine Clearance: 55.7 mL/min (A) (by C-G formula based on SCr of 1.56 mg/dL (H)).      Allergies  Allergen Reactions  . Asa [Aspirin]   . Sulfa Antibiotics     Thank you for allowing pharmacy to be a part of this patient's care.  Tobie Lords, PharmD, BCPS Clinical Pharmacist 12/19/2016

## 2016-12-19 NOTE — Progress Notes (Signed)
[facility] INFECTIOUS DISEASE PROGRESS NOTE Date of Admission:  12/13/2016     ID: Darius [name] is a 51 y.o. male with HIV, AMS Principal Problem:   Altered mental state Active Problems:   HIV (human immunodeficiency virus infection) ([place])   Drug abuse   Hypothyroidism   Seizures ([place])   Subjective: No fevers, still not at baseline.   ROS  Unable to  Provide  Medications:  Antibiotics Given (last 72 hours)    Date/Time Action Medication Dose Rate   12/16/16 1735 Given   vancomycin (VANCOCIN) 750 mg in sodium chloride 0.9 % 150 mL IVPB 750 mg 150 mL/hr   12/16/16 2051 Given   cefTRIAXone (ROCEPHIN) 2 g in dextrose 5 % 50 mL IVPB 2 g 100 mL/hr   12/17/16 0513 Given   vancomycin (VANCOCIN) 750 mg in sodium chloride 0.9 % 150 mL IVPB 750 mg 150 mL/hr   12/17/16 0847 Given   cefTRIAXone (ROCEPHIN) 2 g in dextrose 5 % 50 mL IVPB 2 g 100 mL/hr   12/17/16 1630 Given   vancomycin (VANCOCIN) IVPB 750 mg/150 ml premix 750 mg 150 mL/hr   12/17/16 2126 Given   cefTRIAXone (ROCEPHIN) 2 g in dextrose 5 % 50 mL IVPB 2 g 100 mL/hr   12/18/16 8563 Given   vancomycin (VANCOCIN) IVPB 750 mg/150 ml premix 750 mg 150 mL/hr   12/18/16 0820 Given   cefTRIAXone (ROCEPHIN) 2 g in dextrose 5 % 50 mL IVPB 2 g 100 mL/hr   12/18/16 1719 Given   vancomycin (VANCOCIN) 750 mg in dextrose 5 % 150 mL IVPB 750 mg 150 mL/hr   12/18/16 2303 Given   cefTRIAXone (ROCEPHIN) 2 g in dextrose 5 % 50 mL IVPB 2 g 100 mL/hr   12/19/16 0932 Given   cefTRIAXone (ROCEPHIN) 2 g in dextrose 5 % 50 mL IVPB 2 g 100 mL/hr   12/19/16 1058 Given   vancomycin (VANCOCIN) 750 mg in dextrose 5 % 150 mL IVPB 750 mg 150 mL/hr     . abacavir-dolutegravir-lamiVUDine  1 tablet Oral Daily  . amLODipine  10 mg Oral Daily  . cefTRIAXone (ROCEPHIN)  IV  2 g Intravenous Q12H  . chlorhexidine  15 mL Mouth Rinse BID  . enoxaparin (LOVENOX) injection  40 mg Subcutaneous Q24H  . FLUoxetine  10 mg Oral Daily  . levETIRAcetam  750  mg Intravenous Q12H  . levothyroxine  25 mcg Intravenous Daily  . lidocaine (PF)  5 mL Other Once  . mouth rinse  15 mL Mouth Rinse q12n4p  . modafinil  100 mg Oral Daily  . vancomycin (VANCOCIN) 750 mg/181mL IVPB  750 mg Intravenous Q12H    Objective: Vital signs in last 24 hours: Temp:  [98.7 F (37.1 C)-99.5 F (37.5 C)] 98.7 F (37.1 C) (04/02 0830) Pulse Rate:  [71-125] 80 (04/02 1509) Resp:  [18-20] 18 (04/02 1509) BP: (148-177)/(90-95) 148/90 (04/02 1509) SpO2:  [93 %-99 %] 99 % (04/02 1509) Constitutional: chronically ill appearing, dishelved, not verbalizing, lying in bed, head turned to R side, not interactive HENT: anicteric, pupils somewhat dilated but reactive, not following or tracking,  Mouth/Throat: Oropharynx is very dry, white coating on tongue .  Cardiovascular: Normal rate, regular rhythm and normal heart sounds.  Pulmonary/Chest: Effort normal and breath sounds normal. No respiratory distress. He has no wheezes.  Abdominal: Soft. Bowel sounds are normal. He exhibits no distension. There is no tenderness.  Lymphadenopathy: He has no cervical adenopathy.  Neurological: eyes open  and he will follows simple command, like open mouth but not really responsive. moving all 4 extremities spontaneously, Skin: Skin is warm and dry. No rash noted. No erythema.  Psychiatric:flat affect,   Lab Results  Recent Labs  12/18/16 0406  WBC 7.7  HGB 12.6*  HCT 37.7*  NA 142  K 3.9  CL 112*  CO2 20*  BUN 15  CREATININE 1.17    Microbiology: Results for orders placed or performed during the hospital encounter of 12/13/16  Blood Culture (routine x 2)     Status: None   Collection Time: 12/13/16  8:13 PM  Result Value Ref Range Status   Specimen Description BLOOD R ARM  Final   Special Requests BOTTLES DRAWN AEROBIC AND ANAEROBIC [place]  Final   Culture NO GROWTH 5 DAYS  Final   Report Status 12/18/2016 FINAL  Final  Urine culture     Status: Abnormal   Collection  Time: 12/13/16  8:13 PM  Result Value Ref Range Status   Specimen Description URINE, RANDOM  Final   Special Requests NONE  Final   Culture (A)  Final    >=100,000 COLONIES/mL METHICILLIN RESISTANT STAPHYLOCOCCUS AUREUS   Report Status 12/16/2016 FINAL  Final   Organism ID, Bacteria METHICILLIN RESISTANT STAPHYLOCOCCUS AUREUS (A)  Final      Susceptibility   Methicillin resistant staphylococcus aureus - MIC*    CIPROFLOXACIN >=8 RESISTANT Resistant     GENTAMICIN >=16 RESISTANT Resistant     NITROFURANTOIN <=16 SENSITIVE Sensitive     OXACILLIN >=4 RESISTANT Resistant     TETRACYCLINE <=1 SENSITIVE Sensitive     VANCOMYCIN 1 SENSITIVE Sensitive     TRIMETH/SULFA <=10 SENSITIVE Sensitive     CLINDAMYCIN >=8 RESISTANT Resistant     RIFAMPIN <=0.5 SENSITIVE Sensitive     Inducible Clindamycin NEGATIVE Sensitive     * >=100,000 COLONIES/mL METHICILLIN RESISTANT STAPHYLOCOCCUS AUREUS  Blood Culture (routine x 2)     Status: None   Collection Time: 12/13/16 10:24 PM  Result Value Ref Range Status   Specimen Description BLOOD  R ARM  Final   Special Requests BOTTLES DRAWN AEROBIC AND ANAEROBIC  BCAV  Final   Culture NO GROWTH 5 DAYS  Final   Report Status 12/18/2016 FINAL  Final  CSF culture     Status: None   Collection Time: 12/15/16 11:30 AM  Result Value Ref Range Status   Specimen Description CSF  Final   Special Requests NONE  Final   Gram Stain   Final    MODERATE RED BLOOD CELLS PRESENT RARE WBC SEEN NO ORGANISMS SEEN    Culture   Final    NO GROWTH 3 DAYS Performed at [facility], [address] [place], [place] [address]    Report Status 12/19/2016 FINAL  Final  Culture, fungus without smear     Status: None (Preliminary result)   Collection Time: 12/15/16 11:30 AM  Result Value Ref Range Status   Specimen Description CSF  Final   Special Requests NONE  Final   Culture   Final    NO FUNGUS ISOLATED AFTER 3 DAYS Performed at [facility], [place]  [address] [place], [place] [address]    Report Status PENDING  Incomplete     Studies/Results: Ct Head Wo Contrast  Result Date: 12/13/2016 CLINICAL DATA:  Acute onset of generalized weakness. Patient not speaking. Initial encounter. EXAM: CT HEAD WITHOUT CONTRAST TECHNIQUE: Contiguous axial images were obtained from  the base of the skull through the vertex without intravenous contrast. COMPARISON:  CT of the head performed 04/29/2016 FINDINGS: Brain: No evidence of acute infarction, hemorrhage, hydrocephalus, extra-axial collection or mass lesion/mass effect. Prominence of the ventricles and sulci reflects moderate cortical volume loss. Mild cerebellar atrophy is noted. Scattered periventricular and subcortical white matter change likely reflects small vessel ischemic microangiopathy. Chronic encephalomalacia is noted at the left basal ganglia, and there is ex vacuo dilatation of the left lateral ventricle. Calcification is seen at the pons. No mass effect or midline shift is seen. Chronic subdural thickening is noted on the left side. Vascular: No hyperdense vessel or unexpected calcification. Skull: There is no evidence of fracture; the patient is status post left frontoparietal craniotomy. Sinuses/Orbits: The visualized portions of the orbits are within normal limits. The paranasal sinuses and mastoid air cells are well-aerated. Other: No significant soft tissue abnormalities are seen. IMPRESSION: 1. No acute intracranial pathology seen on CT. 2. Moderate cortical volume loss and scattered small vessel ischemic microangiopathy. 3. Chronic encephalomalacia at the left basal ganglia, with ex vacuo dilatation of the left lateral ventricle. Chronic subdural thickening on the left side. Electronically Signed   By: Garald Balding M.D.   On: 12/13/2016 23:02   Dg Chest Port 1 View  Result Date: 12/13/2016 CLINICAL DATA:  Sepsis EXAM: PORTABLE CHEST 1 VIEW COMPARISON:  04/29/2014 FINDINGS: Cardiomediastinal  silhouette is stable. No infiltrate or pleural effusion. No pulmonary edema. Bony thorax is unremarkable. IMPRESSION: No active disease. Electronically Signed   By: Lahoma Crocker M.D.   On: 12/13/2016 21:41   Dg Fluoro Guided Loc Of Needle/cath Tip For Spinal Inject Lt  Result Date: 12/15/2016 CLINICAL DATA:  Abnormal mental status. EXAM: DIAGNOSTIC LUMBAR PUNCTURE UNDER FLUOROSCOPIC GUIDANCE FLUOROSCOPY TIME:  Fluoroscopy Time:  0 minutes 20 seconds Radiation Exposure Index (if provided by the fluoroscopic device): 5.4 mGy Number of Acquired Spot Images: 1 PROCEDURE: Patient unable to give consent. After discussing the risks and benefits of this procedure with the patient's mother informed consent was obtained. Back was sterilely prepped and draped. Local anesthesia administered 1% lidocaine. 22 gauge needle was advanced into the lumbar canal and clear 9 cc of clear CSF removed and sent for the requested labs. Opening pressure was 9 cm of water. Needle was removed. Hemostasis achieved. No complications. IMPRESSION: Successful fluoroscopically directed lumbar puncture. Electronically Signed   By: Marcello Moores  Register   On: 12/15/2016 12:40    Assessment/Plan: Matthewjames Petrasek is a 51 y.o. male admitted with AMS and  fevers. Last seen nml Monday afternoon then found slumped over in chair on Tuesday. His mother states he normally stays home in daytime and is relatively functional. 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 On admit initially AF but up to 101 overnight. He has utox + cocaine.  I suspect he has seizure or possible CVA as etiology and less likely HIV related CNS infection.  LP done with 2 wbc, 585 rbc, prot 91 (likely due to rbc) and glucose nml. CSF VDRL, crypto ag, HSV PCR  and cx all negative.  HIV PCR < 20, RPR neg UCX with MRSA but  UA was completely nml Has been on vanco and ceftriaxone and fluconazole. I see no evidence of infection as cause of his encephalopathy.  He has had  muliple CNS insults over the years and CT shows chronic changes so likely progressive disease. Given nml CD4 would be low risk for PML  Recommendations DC all abx - no evidence meningitis or infection. 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  Thank you very much for the consult. Will follow with you.  Fraida Veldman P   12/19/2016, 3:29 PM

## 2016-12-20 DIAGNOSIS — G9340 Encephalopathy, unspecified: Secondary | ICD-10-CM | POA: Diagnosis present

## 2016-12-20 DIAGNOSIS — E43 Unspecified severe protein-calorie malnutrition: Secondary | ICD-10-CM | POA: Diagnosis present

## 2016-12-20 DIAGNOSIS — E039 Hypothyroidism, unspecified: Secondary | ICD-10-CM | POA: Diagnosis present

## 2016-12-20 DIAGNOSIS — Z882 Allergy status to sulfonamides status: Secondary | ICD-10-CM | POA: Diagnosis not present

## 2016-12-20 DIAGNOSIS — N183 Chronic kidney disease, stage 3 (moderate): Secondary | ICD-10-CM | POA: Diagnosis present

## 2016-12-20 DIAGNOSIS — F17200 Nicotine dependence, unspecified, uncomplicated: Secondary | ICD-10-CM | POA: Diagnosis present

## 2016-12-20 DIAGNOSIS — R569 Unspecified convulsions: Secondary | ICD-10-CM

## 2016-12-20 DIAGNOSIS — F1410 Cocaine abuse, uncomplicated: Secondary | ICD-10-CM | POA: Diagnosis present

## 2016-12-20 DIAGNOSIS — Z79899 Other long term (current) drug therapy: Secondary | ICD-10-CM | POA: Diagnosis not present

## 2016-12-20 DIAGNOSIS — B370 Candidal stomatitis: Secondary | ICD-10-CM | POA: Diagnosis present

## 2016-12-20 DIAGNOSIS — I129 Hypertensive chronic kidney disease with stage 1 through stage 4 chronic kidney disease, or unspecified chronic kidney disease: Secondary | ICD-10-CM | POA: Diagnosis present

## 2016-12-20 DIAGNOSIS — B1920 Unspecified viral hepatitis C without hepatic coma: Secondary | ICD-10-CM | POA: Diagnosis present

## 2016-12-20 DIAGNOSIS — J189 Pneumonia, unspecified organism: Secondary | ICD-10-CM | POA: Diagnosis present

## 2016-12-20 DIAGNOSIS — R630 Anorexia: Secondary | ICD-10-CM | POA: Diagnosis not present

## 2016-12-20 DIAGNOSIS — Z7189 Other specified counseling: Secondary | ICD-10-CM | POA: Diagnosis not present

## 2016-12-20 DIAGNOSIS — Z8572 Personal history of non-Hodgkin lymphomas: Secondary | ICD-10-CM | POA: Diagnosis not present

## 2016-12-20 DIAGNOSIS — Z888 Allergy status to other drugs, medicaments and biological substances status: Secondary | ICD-10-CM | POA: Diagnosis not present

## 2016-12-20 DIAGNOSIS — R4182 Altered mental status, unspecified: Secondary | ICD-10-CM | POA: Diagnosis not present

## 2016-12-20 DIAGNOSIS — R410 Disorientation, unspecified: Secondary | ICD-10-CM | POA: Diagnosis not present

## 2016-12-20 DIAGNOSIS — Z515 Encounter for palliative care: Secondary | ICD-10-CM | POA: Diagnosis not present

## 2016-12-20 DIAGNOSIS — Z23 Encounter for immunization: Secondary | ICD-10-CM | POA: Diagnosis not present

## 2016-12-20 DIAGNOSIS — H518 Other specified disorders of binocular movement: Secondary | ICD-10-CM | POA: Diagnosis present

## 2016-12-20 DIAGNOSIS — B20 Human immunodeficiency virus [HIV] disease: Secondary | ICD-10-CM | POA: Diagnosis present

## 2016-12-20 DIAGNOSIS — F329 Major depressive disorder, single episode, unspecified: Secondary | ICD-10-CM | POA: Diagnosis present

## 2016-12-20 DIAGNOSIS — G40909 Epilepsy, unspecified, not intractable, without status epilepticus: Secondary | ICD-10-CM | POA: Diagnosis present

## 2016-12-20 DIAGNOSIS — Z681 Body mass index (BMI) 19 or less, adult: Secondary | ICD-10-CM | POA: Diagnosis not present

## 2016-12-20 DIAGNOSIS — R1310 Dysphagia, unspecified: Secondary | ICD-10-CM | POA: Diagnosis present

## 2016-12-20 DIAGNOSIS — K2980 Duodenitis without bleeding: Secondary | ICD-10-CM | POA: Diagnosis present

## 2016-12-20 MED ORDER — LORAZEPAM 2 MG/ML IJ SOLN
INTRAMUSCULAR | Status: AC
  Administered 2016-12-20: 2 mg
  Filled 2016-12-20: qty 2

## 2016-12-20 MED ORDER — LORAZEPAM 2 MG/ML IJ SOLN
2.0000 mg | Freq: Once | INTRAMUSCULAR | Status: AC
  Administered 2016-12-20: 2 mg via INTRAVENOUS

## 2016-12-20 MED ORDER — SODIUM CHLORIDE 0.9 % IV SOLN
1500.0000 mg | Freq: Two times a day (BID) | INTRAVENOUS | Status: DC
  Administered 2016-12-20 – 2016-12-22 (×4): 1500 mg via INTRAVENOUS
  Filled 2016-12-20 (×6): qty 15

## 2016-12-20 MED ORDER — LORAZEPAM 2 MG/ML IJ SOLN
INTRAMUSCULAR | Status: AC
  Filled 2016-12-20: qty 1

## 2016-12-20 MED ORDER — LEVETIRACETAM 500 MG/5ML IV SOLN
1000.0000 mg | Freq: Once | INTRAVENOUS | Status: AC
  Administered 2016-12-20: 1000 mg via INTRAVENOUS
  Filled 2016-12-20: qty 10

## 2016-12-20 MED ORDER — LORAZEPAM 2 MG/ML IJ SOLN: 2.0000 mg | Freq: Once | INTRAMUSCULAR | Status: AC

## 2016-12-20 NOTE — Care Management (Signed)
TC to Mother, Pharell Rolfson. Requested she bring patients insurance cards in and let staff get a copy of them. She verbalized understanding.

## 2016-12-20 NOTE — Progress Notes (Addendum)
Subjective: On entering the room patient seizing with left eye deviation and left facial twitching.  Bilateral clonic activity noted as well.    Objective: Current vital signs: BP (!) 126/95   Pulse 100   Temp 98.7 F (37.1 C) (Oral)   Resp 18   Ht 6' (1.829 m)   Wt 64.2 kg (141 lb 8 oz)   SpO2 100%   BMI 19.19 kg/m  Vital signs in last 24 hours: Temp:  [98 F (36.7 C)-98.7 F (37.1 C)] 98.7 F (37.1 C) (04/03 0744) Pulse Rate:  [72-100] 100 (04/03 1043) Resp:  [18] 18 (04/03 0744) BP: (126-155)/(88-96) 126/95 (04/03 1051) SpO2:  [99 %-100 %] 100 % (04/03 1043)  Intake/Output from previous day: 04/02 0701 - 04/03 0700 In: 1450 [I.V.:1235; IV Piggyback:215] Out: -  Intake/Output this shift: No intake/output data recorded. Nutritional status: Diet NPO time specified Except for: Sips with Meds  Neurologic Exam: Mental Status: Patient does not respond to verbal stimuli.  Does not respond to deep sternal rub.  Does not follow commands.  No verbalizations noted.  Cranial Nerves: II: pupils fixed III,IV,VI: left eye deviation V,VII: left facial clonic activity Motor: Clonic activity of all extremities Sensory: Does not respond to noxious stimuli in any extremity.   Lab Results: Basic Metabolic Panel:  Recent Labs Lab 12/13/16 2013 12/14/16 0923 12/16/16 0600 12/18/16 0406  NA 147* 147*  --  142  K 3.8 3.8  --  3.9  CL 110 112*  --  112*  CO2 27 24  --  20*  GLUCOSE 110* 91  --  80  BUN 21* 19  --  15  CREATININE 1.56* 1.47* 1.28* 1.17  CALCIUM 10.0 9.4  --  8.7*    Liver Function Tests:  Recent Labs Lab 12/13/16 2013  AST 50*  ALT 27  ALKPHOS 66  BILITOT 0.6  PROT 10.5*  ALBUMIN 4.9    Recent Labs Lab 12/13/16 2013  LIPASE <10*    Recent Labs Lab 12/13/16 2057  AMMONIA 37*    CBC:  Recent Labs Lab 12/13/16 2013 12/14/16 0923 12/14/16 1204 12/18/16 0406  WBC 10.5 11.1* 9.2 7.5  7.7  NEUTROABS 7.5*  --  5.5 4.7  HGB 14.1 13.4   --  12.6*  HCT 42.2 41.4 41.2 37.7*  36.3*  MCV 99.9 99.9 100* 99.8  98*  PLT 191 157 182 150  180    Cardiac Enzymes: No results for input(s): CKTOTAL, CKMB, CKMBINDEX, TROPONINI in the last 168 hours.  Lipid Panel: No results for input(s): CHOL, TRIG, HDL, CHOLHDL, VLDL, LDLCALC in the last 168 hours.  CBG: No results for input(s): GLUCAP in the last 168 hours.  Microbiology: Results for orders placed or performed during the hospital encounter of 12/13/16  Blood Culture (routine x 2)     Status: None   Collection Time: 12/13/16  8:13 PM  Result Value Ref Range Status   Specimen Description BLOOD R ARM  Final   Special Requests BOTTLES DRAWN AEROBIC AND ANAEROBIC [place]  Final   Culture NO GROWTH 5 DAYS  Final   Report Status 12/18/2016 FINAL  Final  Urine culture     Status: Abnormal   Collection Time: 12/13/16  8:13 PM  Result Value Ref Range Status   Specimen Description URINE, RANDOM  Final   Special Requests NONE  Final   Culture (A)  Final    >=100,000 COLONIES/mL METHICILLIN RESISTANT STAPHYLOCOCCUS AUREUS   Report Status 12/16/2016 FINAL  Final   Organism ID, Bacteria METHICILLIN RESISTANT STAPHYLOCOCCUS AUREUS (A)  Final      Susceptibility   Methicillin resistant staphylococcus aureus - MIC*    CIPROFLOXACIN >=8 RESISTANT Resistant     GENTAMICIN >=16 RESISTANT Resistant     NITROFURANTOIN <=16 SENSITIVE Sensitive     OXACILLIN >=4 RESISTANT Resistant     TETRACYCLINE <=1 SENSITIVE Sensitive     VANCOMYCIN 1 SENSITIVE Sensitive     TRIMETH/SULFA <=10 SENSITIVE Sensitive     CLINDAMYCIN >=8 RESISTANT Resistant     RIFAMPIN <=0.5 SENSITIVE Sensitive     Inducible Clindamycin NEGATIVE Sensitive     * >=100,000 COLONIES/mL METHICILLIN RESISTANT STAPHYLOCOCCUS AUREUS  Blood Culture (routine x 2)     Status: None   Collection Time: 12/13/16 10:24 PM  Result Value Ref Range Status   Specimen Description BLOOD  R ARM  Final   Special Requests BOTTLES DRAWN  AEROBIC AND ANAEROBIC  BCAV  Final   Culture NO GROWTH 5 DAYS  Final   Report Status 12/18/2016 FINAL  Final  CSF culture     Status: None   Collection Time: 12/15/16 11:30 AM  Result Value Ref Range Status   Specimen Description CSF  Final   Special Requests NONE  Final   Gram Stain   Final    MODERATE RED BLOOD CELLS PRESENT RARE WBC SEEN NO ORGANISMS SEEN    Culture   Final    NO GROWTH 3 DAYS Performed at [facility], [address] [place], [place] [address]    Report Status 12/19/2016 FINAL  Final  Culture, fungus without smear     Status: None (Preliminary result)   Collection Time: 12/15/16 11:30 AM  Result Value Ref Range Status   Specimen Description CSF  Final   Special Requests NONE  Final   Culture   Final    NO FUNGUS ISOLATED AFTER 3 DAYS Performed at [facility], [place] [address], [place], [place] [address]    Report Status PENDING  Incomplete    Coagulation Studies: No results for input(s): LABPROT, INR in the last 72 hours.  Imaging: No results found.  Medications:  I have reviewed the patient's current medications. Scheduled: . abacavir-dolutegravir-lamiVUDine  1 tablet Oral Daily  . amLODipine  10 mg Oral Daily  . chlorhexidine  15 mL Mouth Rinse BID  . enoxaparin (LOVENOX) injection  40 mg Subcutaneous Q24H  . FLUoxetine  10 mg Oral Daily  . levETIRAcetam  1,500 mg Intravenous Q12H  . levothyroxine  25 mcg Intravenous Daily  . lidocaine (PF)  5 mL Other Once  . mouth rinse  15 mL Mouth Rinse q12n4p    Assessment/Plan: Patient seizing.  Unclear length of seizure activity.  Keppra recently decreased.  Patient required 4mg  of Ativan before cessation of seizure activity.  Patient then loaded with 1000mg  of Keppra.  Received last dose of 750mg  about 0130 this morning.    Recommendations: 1.  Maintenance of 1500mg  IV q 12 hours of Keppra 2.  Neurochecks q 2 hours 3.  Seizure precautions 4.  Ativan prn seizure activity.  If further  seizures would start additional anticonvulsant therapy.  Would consider Vimpat load.    This patient is critically ill and at significant risk of neurological worsening, death and care requires constant monitoring of vital signs, hemodynamics,respiratory and cardiac monitoring, neurological assessment, discussion with family, other specialists and medical decision making of high complexity. I spent 35 minutes of neurocritical  care time  in the care of  this patient.    LOS: 0 days   Alexis Goodell, MD Neurology [phone] 12/20/2016  1:18 PM

## 2016-12-20 NOTE — Progress Notes (Signed)
Called to the room by MD Doy Mince, [place] notified nurse pt was having a seizure. MD ordered a total of 4 mg of Ativan IV (see flowsheet), patient placed on 4 L of nasal cannula, VSS (see flowsheet). MD Reynolds placing orders.

## 2016-12-20 NOTE — Progress Notes (Signed)
[name] INFECTIOUS DISEASE PROGRESS NOTE Date of Admission:  12/13/2016     ID: [name] is a 51 y.o. male with HIV, AMS Principal Problem:   Altered mental state Active Problems:   HIV (human immunodeficiency virus infection) ([place])   Drug abuse   Hypothyroidism   Seizures ([place])   Acute encephalopathy   Subjective: No fevers, noted to have sz activity this am  ROS  Unable to  Provide  Medications:  Antibiotics Given (last 72 hours)    Date/Time Action Medication Dose Rate   12/17/16 1630 Given   vancomycin (VANCOCIN) IVPB 750 mg/150 ml premix 750 mg 150 mL/hr   12/17/16 2126 Given   cefTRIAXone (ROCEPHIN) 2 g in dextrose 5 % 50 mL IVPB 2 g 100 mL/hr   12/18/16 7829 Given   vancomycin (VANCOCIN) IVPB 750 mg/150 ml premix 750 mg 150 mL/hr   12/18/16 0820 Given   cefTRIAXone (ROCEPHIN) 2 g in dextrose 5 % 50 mL IVPB 2 g 100 mL/hr   12/18/16 1719 Given   vancomycin (VANCOCIN) 750 mg in dextrose 5 % 150 mL IVPB 750 mg 150 mL/hr   12/18/16 2303 Given   cefTRIAXone (ROCEPHIN) 2 g in dextrose 5 % 50 mL IVPB 2 g 100 mL/hr   12/19/16 0932 Given   cefTRIAXone (ROCEPHIN) 2 g in dextrose 5 % 50 mL IVPB 2 g 100 mL/hr   12/19/16 1058 Given   vancomycin (VANCOCIN) 750 mg in dextrose 5 % 150 mL IVPB 750 mg 150 mL/hr     . abacavir-dolutegravir-lamiVUDine  1 tablet Oral Daily  . amLODipine  10 mg Oral Daily  . chlorhexidine  15 mL Mouth Rinse BID  . enoxaparin (LOVENOX) injection  40 mg Subcutaneous Q24H  . FLUoxetine  10 mg Oral Daily  . levETIRAcetam  1,500 mg Intravenous Q12H  . levothyroxine  25 mcg Intravenous Daily  . lidocaine (PF)  5 mL Other Once  . mouth rinse  15 mL Mouth Rinse q12n4p    Objective: Vital signs in last 24 hours: Temp:  [98 F (36.7 C)-98.7 F (37.1 C)] 98.6 F (37 C) (04/03 1503) Pulse Rate:  [72-100] 97 (04/03 1503) Resp:  [18] 18 (04/03 1503) BP: (126-159)/(85-96) 159/85 (04/03 1503) SpO2:  [99 %-100 %] 100 % (04/03  1503) Constitutional: chronically ill appearing, dishelved, not verbalizing, lying in bed, sedated- just received ativan  HENT: anicteric, pupils somewhat dilated but reactive, not following or tracking,  Mouth/Throat: Oropharynx is very dry, white coating on tongue .  Cardiovascular: Normal rate, regular rhythm and normal heart sounds.  Pulmonary/Chest: Effort normal and breath sounds normal. No respiratory distress. He has no wheezes.  Abdominal: Soft. Bowel sounds are normal. He exhibits no distension. There is no tenderness.  Lymphadenopathy: He has no cervical adenopathy.  Neurological: sedated, no sx activity  Skin: Skin is warm and dry. No rash noted. No erythema.  Psychiatric:flat affect,   Lab Results  Recent Labs  12/18/16 0406  WBC 7.5  7.7  HGB 12.6*  HCT 37.7*  36.3*  NA 142  K 3.9  CL 112*  CO2 20*  BUN 15  CREATININE 1.17    Microbiology: Results for orders placed or performed during the hospital encounter of 12/13/16  Blood Culture (routine x 2)     Status: None   Collection Time: 12/13/16  8:13 PM  Result Value Ref Range Status   Specimen Description BLOOD R ARM  Final   Special Requests BOTTLES DRAWN AEROBIC AND  ANAEROBIC [facility]  Final   Culture NO GROWTH 5 DAYS  Final   Report Status 12/18/2016 FINAL  Final  Urine culture     Status: Abnormal   Collection Time: 12/13/16  8:13 PM  Result Value Ref Range Status   Specimen Description URINE, RANDOM  Final   Special Requests NONE  Final   Culture (A)  Final    >=100,000 COLONIES/mL METHICILLIN RESISTANT STAPHYLOCOCCUS AUREUS   Report Status 12/16/2016 FINAL  Final   Organism ID, Bacteria METHICILLIN RESISTANT STAPHYLOCOCCUS AUREUS (A)  Final      Susceptibility   Methicillin resistant staphylococcus aureus - MIC*    CIPROFLOXACIN >=8 RESISTANT Resistant     GENTAMICIN >=16 RESISTANT Resistant     NITROFURANTOIN <=16 SENSITIVE Sensitive     OXACILLIN >=4 RESISTANT Resistant     TETRACYCLINE <=1  SENSITIVE Sensitive     VANCOMYCIN 1 SENSITIVE Sensitive     TRIMETH/SULFA <=10 SENSITIVE Sensitive     CLINDAMYCIN >=8 RESISTANT Resistant     RIFAMPIN <=0.5 SENSITIVE Sensitive     Inducible Clindamycin NEGATIVE Sensitive     * >=100,000 COLONIES/mL METHICILLIN RESISTANT STAPHYLOCOCCUS AUREUS  Blood Culture (routine x 2)     Status: None   Collection Time: 12/13/16 10:24 PM  Result Value Ref Range Status   Specimen Description BLOOD  R ARM  Final   Special Requests BOTTLES DRAWN AEROBIC AND ANAEROBIC  BCAV  Final   Culture NO GROWTH 5 DAYS  Final   Report Status 12/18/2016 FINAL  Final  CSF culture     Status: None   Collection Time: 12/15/16 11:30 AM  Result Value Ref Range Status   Specimen Description CSF  Final   Special Requests NONE  Final   Gram Stain   Final    MODERATE RED BLOOD CELLS PRESENT RARE WBC SEEN NO ORGANISMS SEEN    Culture   Final    NO GROWTH 3 DAYS Performed at [facility], [address] [place], [place] [address]    Report Status 12/19/2016 FINAL  Final  Culture, fungus without smear     Status: None (Preliminary result)   Collection Time: 12/15/16 11:30 AM  Result Value Ref Range Status   Specimen Description CSF  Final   Special Requests NONE  Final   Culture   Final    NO FUNGUS ISOLATED AFTER 3 DAYS Performed at [facility], [place] [address], [place], [place] [address]    Report Status PENDING  Incomplete     Studies/Results: Ct Head Wo Contrast  Result Date: 12/13/2016 CLINICAL DATA:  Acute onset of generalized weakness. Patient not speaking. Initial encounter. EXAM: CT HEAD WITHOUT CONTRAST TECHNIQUE: Contiguous axial images were obtained from the base of the skull through the vertex without intravenous contrast. COMPARISON:  CT of the head performed 04/29/2016 FINDINGS: Brain: No evidence of acute infarction, hemorrhage, hydrocephalus, extra-axial collection or mass lesion/mass effect. Prominence of the ventricles and sulci  reflects moderate cortical volume loss. Mild cerebellar atrophy is noted. Scattered periventricular and subcortical white matter change likely reflects small vessel ischemic microangiopathy. Chronic encephalomalacia is noted at the left basal ganglia, and there is ex vacuo dilatation of the left lateral ventricle. Calcification is seen at the pons. No mass effect or midline shift is seen. Chronic subdural thickening is noted on the left side. Vascular: No hyperdense vessel or unexpected calcification. Skull: There is no evidence of fracture; the patient is status post left frontoparietal craniotomy.  Sinuses/Orbits: The visualized portions of the orbits are within normal limits. The paranasal sinuses and mastoid air cells are well-aerated. Other: No significant soft tissue abnormalities are seen. IMPRESSION: 1. No acute intracranial pathology seen on CT. 2. Moderate cortical volume loss and scattered small vessel ischemic microangiopathy. 3. Chronic encephalomalacia at the left basal ganglia, with ex vacuo dilatation of the left lateral ventricle. Chronic subdural thickening on the left side. Electronically Signed   By: Garald Balding M.D.   On: 12/13/2016 23:02   Dg Chest Port 1 View  Result Date: 12/13/2016 CLINICAL DATA:  Sepsis EXAM: PORTABLE CHEST 1 VIEW COMPARISON:  04/29/2014 FINDINGS: Cardiomediastinal silhouette is stable. No infiltrate or pleural effusion. No pulmonary edema. Bony thorax is unremarkable. IMPRESSION: No active disease. Electronically Signed   By: Lahoma Crocker M.D.   On: 12/13/2016 21:41   Dg Fluoro Guided Loc Of Needle/cath Tip For Spinal Inject Lt  Result Date: 12/15/2016 CLINICAL DATA:  Abnormal mental status. EXAM: DIAGNOSTIC LUMBAR PUNCTURE UNDER FLUOROSCOPIC GUIDANCE FLUOROSCOPY TIME:  Fluoroscopy Time:  0 minutes 20 seconds Radiation Exposure Index (if provided by the fluoroscopic device): 5.4 mGy Number of Acquired Spot Images: 1 PROCEDURE: Patient unable to give consent. After  discussing the risks and benefits of this procedure with the patient's mother informed consent was obtained. Back was sterilely prepped and draped. Local anesthesia administered 1% lidocaine. 22 gauge needle was advanced into the lumbar canal and clear 9 cc of clear CSF removed and sent for the requested labs. Opening pressure was 9 cm of water. Needle was removed. Hemostasis achieved. No complications. IMPRESSION: Successful fluoroscopically directed lumbar puncture. Electronically Signed   By: Marcello Moores  Register   On: 12/15/2016 12:40    Assessment/Plan: Brad Lieurance is a 51 y.o. male admitted with AMS and  fevers. Last seen nml Monday afternoon then found slumped over in chair on Tuesday. His mother states he normally stays home in daytime and is relatively functional. 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 On admit initially AF but up to 101 overnight. He has utox + cocaine.  I suspect he has seizure or possible CVA as etiology and less likely HIV related CNS infection.  LP done with 2 wbc, 585 rbc, prot 91 (likely due to rbc) and glucose nml. CSF VDRL, crypto ag, HSV PCR  and cx all negative.  HIV PCR < 20, RPR neg UCX with MRSA but UA was completely nml  Has been on vanco and ceftriaxone and fluconazole. I see no evidence of infection as cause of his encephalopathy.  He has had muliple CNS insults over the years and CT shows chronic changes so likely progressive disease. Given nml CD4 would be low risk for PML 4/3- stopped abx 4/2. Had sz activity I think he has no evidence of infection at this time.  Recommendations Remain off abx.  I think this is all seizure related.  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  I will sign off but please call if needed. If he changes his sz meds would  rec checking with pharmacy to ensure no interaction with the triumeq. Some meds may require the addition of another dose of doultagravir.  Thank you very much for the consult.  FITZGERALD, DAVID P   12/20/2016, 3:26  PM

## 2016-12-20 NOTE — Progress Notes (Signed)
SLP Cancellation Note  Patient Details Name: Darius [name] MRN: [id number] DOB: [birth year]   Cancelled treatment:       Reason Eval/Treat Not Completed: Medical issues which prohibited therapy (chart reviewed; s/s of seizure activity today per MD note). Pt remains NPO at this time. ST services will f/u w/ pt's status next 1-3 days to assess appropriateness for an oral diet. Recommend frequent oral care while NPO.    Orinda Kenner, MS, CCC-SLP Watson,Katherine 12/20/2016, 5:08 PM

## 2016-12-20 NOTE — Progress Notes (Addendum)
[place] [facility]                                                                                                                                                                                  Patient Demographics   Darius [name], is a 51 y.o. male, DOB - Apr 27, 1966, NLZ:[id number]  Admit date - 12/13/2016   Admitting Physician Lance Coon, MD  Outpatient Primary MD for the patient is Pcp Not In System   LOS - 0  Subjective: Patient admitted with acute encephalopathy. He is awake but still very confused. And not able to communicate much. His mother is at bedside. His mother reports that patient is disabled and lives at home alone but normally he is awake alert and communicative. He does have HIV and she states that last was seen and [facility]. His viral load was undetectable. According to her he is been taking his meds.  The patient is still unresponsive to stimuli. His eyes blinked and stared, then had seizure activity this am.   Review of Systems:   CONSTITUTIONAL: Unable to provide  Vitals:   Vitals:   12/19/16 2310 12/20/16 0744 12/20/16 1043 12/20/16 1051  BP: (!) 151/88 (!) 155/90 (!) 133/96 (!) 126/95  Pulse: 72 73 100   Resp: 18 18    Temp: 98 F (36.7 C) 98.7 F (37.1 C)    TempSrc: Oral Oral    SpO2: 99% 100% 100%   Weight:      Height:        Wt Readings from Last 3 Encounters:  12/14/16 141 lb 8 oz (64.2 kg)  04/30/16 145 lb (65.8 kg)     Intake/Output Summary (Last 24 hours) at 12/20/16 1302 Last data filed at 12/20/16 0442  Gross per 24 hour  Intake             1450 ml  Output                0 ml  Net             1450 ml    Physical Exam:   GENERAL: Pleasant-appearing in no apparent distress. He is confused HEAD, EYES, EARS, NOSE AND THROAT: Atraumatic, normocephalic. . Pupils equal and reactive to light. Sclerae anicteric. No conjunctival injection. Oral thrush. NECK: Supple. There is no  jugular venous distention. No bruits, no lymphadenopathy, no thyromegaly.  HEART: Regular rate and rhythm,. No murmurs, no rubs, no clicks.  LUNGS: Clear to auscultation bilaterally. No rales or rhonchi. No wheezes.  ABDOMEN: Soft, flat, nontender, nondistended. Has good bowel sounds. No hepatosplenomegaly appreciated.  EXTREMITIES: No evidence of any  cyanosis, clubbing, or peripheral edema.  +2 pedal and radial pulses bilaterally.  NEUROLOGIC: The patient is awake with eyes blinked and stared, no reaction to verbal and pain stimuli.  Does not follow commands. SKIN: Moist and warm with no rashes appreciated.  Psych: Not anxious, depressed LN: No inguinal LN enlargement    Antibiotics   Anti-infectives    Start     Dose/Rate Route Frequency Ordered Stop   12/18/16 1800  vancomycin (VANCOCIN) 750 mg in dextrose 5 % 150 mL IVPB  Status:  Discontinued     750 mg 150 mL/hr over 60 Minutes Intravenous Every 12 hours 12/18/16 0902 12/19/16 1535   12/17/16 1800  vancomycin (VANCOCIN) IVPB 750 mg/150 ml premix  Status:  Discontinued     750 mg 150 mL/hr over 60 Minutes Intravenous Every 12 hours 12/17/16 1122 12/18/16 0853   12/16/16 1200  fluconazole (DIFLUCAN) IVPB 100 mg     100 mg 50 mL/hr over 60 Minutes Intravenous Every 24 hours 12/16/16 1140 12/18/16 1254   12/15/16 1630  vancomycin (VANCOCIN) IVPB 1000 mg/200 mL premix  Status:  Discontinued     1,000 mg 200 mL/hr over 60 Minutes Intravenous Every 12 hours 12/15/16 0522 12/15/16 0920   12/15/16 1630  vancomycin (VANCOCIN) IVPB 750 mg/150 ml premix  Status:  Discontinued     750 mg 150 mL/hr over 60 Minutes Intravenous Every 12 hours 12/15/16 0920 12/15/16 0930   12/15/16 1630  vancomycin (VANCOCIN) 750 mg in sodium chloride 0.9 % 150 mL IVPB  Status:  Discontinued     750 mg 150 mL/hr over 60 Minutes Intravenous Every 12 hours 12/15/16 0930 12/17/16 1122   12/15/16 0530  vancomycin (VANCOCIN) 250 mg in sodium chloride 0.9 % 100 mL  IVPB  Status:  Discontinued     250 mg 100 mL/hr over 60 Minutes Intravenous  Once 12/15/16 0523 12/15/16 0525   12/14/16 1430  cefTRIAXone (ROCEPHIN) 2 g in dextrose 5 % 50 mL IVPB  Status:  Discontinued     2 g 100 mL/hr over 30 Minutes Intravenous Every 12 hours 12/14/16 1426 12/19/16 1535   12/14/16 1000  abacavir-dolutegravir-lamiVUDine (TRIUMEQ) 600-50-300 MG per tablet 1 tablet     1 tablet Oral Daily 12/14/16 0155     12/14/16 0600  piperacillin-tazobactam (ZOSYN) IVPB 3.375 g  Status:  Discontinued     3.375 g 12.5 mL/hr over 240 Minutes Intravenous Every 8 hours 12/13/16 2232 12/14/16 1426   12/14/16 0430  vancomycin (VANCOCIN) IVPB 750 mg/150 ml premix  Status:  Discontinued     750 mg 150 mL/hr over 60 Minutes Intravenous Every 12 hours 12/13/16 2232 12/15/16 0522   12/13/16 2215  piperacillin-tazobactam (ZOSYN) IVPB 3.375 g     3.375 g 100 mL/hr over 30 Minutes Intravenous  Once 12/13/16 2212 12/13/16 2310   12/13/16 2215  vancomycin (VANCOCIN) IVPB 1000 mg/200 mL premix     1,000 mg 200 mL/hr over 60 Minutes Intravenous  Once 12/13/16 2212 12/13/16 2325      Medications   Scheduled Meds: . abacavir-dolutegravir-lamiVUDine  1 tablet Oral Daily  . amLODipine  10 mg Oral Daily  . chlorhexidine  15 mL Mouth Rinse BID  . enoxaparin (LOVENOX) injection  40 mg Subcutaneous Q24H  . FLUoxetine  10 mg Oral Daily  . levETIRAcetam  1,500 mg Intravenous Q12H  . levothyroxine  25 mcg Intravenous Daily  . lidocaine (PF)  5 mL Other Once  . mouth rinse  15 mL  Mouth Rinse q12n4p  . modafinil  100 mg Oral Daily   Continuous Infusions: . 0.9 % NaCl with KCl 20 mEq / L 50 mL/hr at 12/19/16 1058   PRN Meds:.acetaminophen **OR** acetaminophen, hydrALAZINE, ondansetron **OR** ondansetron (ZOFRAN) IV   Data Review:   Micro Results Recent Results (from the past 240 hour(s))  Blood Culture (routine x 2)     Status: None   Collection Time: 12/13/16  8:13 PM  Result Value Ref Range  Status   Specimen Description BLOOD R ARM  Final   Special Requests BOTTLES DRAWN AEROBIC AND ANAEROBIC [place]  Final   Culture NO GROWTH 5 DAYS  Final   Report Status 12/18/2016 FINAL  Final  Urine culture     Status: Abnormal   Collection Time: 12/13/16  8:13 PM  Result Value Ref Range Status   Specimen Description URINE, RANDOM  Final   Special Requests NONE  Final   Culture (A)  Final    >=100,000 COLONIES/mL METHICILLIN RESISTANT STAPHYLOCOCCUS AUREUS   Report Status 12/16/2016 FINAL  Final   Organism ID, Bacteria METHICILLIN RESISTANT STAPHYLOCOCCUS AUREUS (A)  Final      Susceptibility   Methicillin resistant staphylococcus aureus - MIC*    CIPROFLOXACIN >=8 RESISTANT Resistant     GENTAMICIN >=16 RESISTANT Resistant     NITROFURANTOIN <=16 SENSITIVE Sensitive     OXACILLIN >=4 RESISTANT Resistant     TETRACYCLINE <=1 SENSITIVE Sensitive     VANCOMYCIN 1 SENSITIVE Sensitive     TRIMETH/SULFA <=10 SENSITIVE Sensitive     CLINDAMYCIN >=8 RESISTANT Resistant     RIFAMPIN <=0.5 SENSITIVE Sensitive     Inducible Clindamycin NEGATIVE Sensitive     * >=100,000 COLONIES/mL METHICILLIN RESISTANT STAPHYLOCOCCUS AUREUS  Blood Culture (routine x 2)     Status: None   Collection Time: 12/13/16 10:24 PM  Result Value Ref Range Status   Specimen Description BLOOD  R ARM  Final   Special Requests BOTTLES DRAWN AEROBIC AND ANAEROBIC  BCAV  Final   Culture NO GROWTH 5 DAYS  Final   Report Status 12/18/2016 FINAL  Final  CSF culture     Status: None   Collection Time: 12/15/16 11:30 AM  Result Value Ref Range Status   Specimen Description CSF  Final   Special Requests NONE  Final   Gram Stain   Final    MODERATE RED BLOOD CELLS PRESENT RARE WBC SEEN NO ORGANISMS SEEN    Culture   Final    NO GROWTH 3 DAYS Performed at [facility], [address], [place], [place] [address]    Report Status 12/19/2016 FINAL  Final  Culture, fungus without smear     Status: None (Preliminary  result)   Collection Time: 12/15/16 11:30 AM  Result Value Ref Range Status   Specimen Description CSF  Final   Special Requests NONE  Final   Culture   Final    NO FUNGUS ISOLATED AFTER 3 DAYS Performed at [facility], [place] [address] [place], [place] [address]    Report Status PENDING  Incomplete    Radiology Reports Ct Head Wo Contrast  Result Date: 12/13/2016 CLINICAL DATA:  Acute onset of generalized weakness. Patient not speaking. Initial encounter. EXAM: CT HEAD WITHOUT CONTRAST TECHNIQUE: Contiguous axial images were obtained from the base of the skull through the vertex without intravenous contrast. COMPARISON:  CT of the head performed 04/29/2016 FINDINGS: Brain: No evidence of acute infarction, hemorrhage, hydrocephalus,  extra-axial collection or mass lesion/mass effect. Prominence of the ventricles and sulci reflects moderate cortical volume loss. Mild cerebellar atrophy is noted. Scattered periventricular and subcortical white matter change likely reflects small vessel ischemic microangiopathy. Chronic encephalomalacia is noted at the left basal ganglia, and there is ex vacuo dilatation of the left lateral ventricle. Calcification is seen at the pons. No mass effect or midline shift is seen. Chronic subdural thickening is noted on the left side. Vascular: No hyperdense vessel or unexpected calcification. Skull: There is no evidence of fracture; the patient is status post left frontoparietal craniotomy. Sinuses/Orbits: The visualized portions of the orbits are within normal limits. The paranasal sinuses and mastoid air cells are well-aerated. Other: No significant soft tissue abnormalities are seen. IMPRESSION: 1. No acute intracranial pathology seen on CT. 2. Moderate cortical volume loss and scattered small vessel ischemic microangiopathy. 3. Chronic encephalomalacia at the left basal ganglia, with ex vacuo dilatation of the left lateral ventricle. Chronic subdural thickening on  the left side. Electronically Signed   By: Garald Balding M.D.   On: 12/13/2016 23:02   Dg Chest Port 1 View  Result Date: 12/13/2016 CLINICAL DATA:  Sepsis EXAM: PORTABLE CHEST 1 VIEW COMPARISON:  04/29/2014 FINDINGS: Cardiomediastinal silhouette is stable. No infiltrate or pleural effusion. No pulmonary edema. Bony thorax is unremarkable. IMPRESSION: No active disease. Electronically Signed   By: Lahoma Crocker M.D.   On: 12/13/2016 21:41   Dg Fluoro Guided Loc Of Needle/cath Tip For Spinal Inject Lt  Result Date: 12/15/2016 CLINICAL DATA:  Abnormal mental status. EXAM: DIAGNOSTIC LUMBAR PUNCTURE UNDER FLUOROSCOPIC GUIDANCE FLUOROSCOPY TIME:  Fluoroscopy Time:  0 minutes 20 seconds Radiation Exposure Index (if provided by the fluoroscopic device): 5.4 mGy Number of Acquired Spot Images: 1 PROCEDURE: Patient unable to give consent. After discussing the risks and benefits of this procedure with the patient's mother informed consent was obtained. Back was sterilely prepped and draped. Local anesthesia administered 1% lidocaine. 22 gauge needle was advanced into the lumbar canal and clear 9 cc of clear CSF removed and sent for the requested labs. Opening pressure was 9 cm of water. Needle was removed. Hemostasis achieved. No complications. IMPRESSION: Successful fluoroscopically directed lumbar puncture. Electronically Signed   By: Marcello Moores  Register   On: 12/15/2016 12:40     CBC  Recent Labs Lab 12/13/16 2013 12/14/16 0923 12/14/16 1204 12/18/16 0406  WBC 10.5 11.1* 9.2 7.5  7.7  HGB 14.1 13.4  --  12.6*  HCT 42.2 41.4 41.2 37.7*  36.3*  PLT 191 157 182 150  180  MCV 99.9 99.9 100* 99.8  98*  MCH 33.5 32.4 32.1 32.8  33.4  MCHC 33.5 32.5 32.3 33.3  33.5  RDW 15.3* 15.0* 14.8 14.1  14.0  LYMPHSABS 1.7  --  2.5 1.8  MONOABS 1.2*  --   --   --   EOSABS 0.0  --  0.0 0.2  BASOSABS 0.0  --  0.0 0.0    Chemistries   Recent Labs Lab 12/13/16 2013 12/14/16 0923 12/16/16 0600  12/18/16 0406  NA 147* 147*  --  142  K 3.8 3.8  --  3.9  CL 110 112*  --  112*  CO2 27 24  --  20*  GLUCOSE 110* 91  --  80  BUN 21* 19  --  15  CREATININE 1.56* 1.47* 1.28* 1.17  CALCIUM 10.0 9.4  --  8.7*  AST 50*  --   --   --  ALT 27  --   --   --   ALKPHOS 66  --   --   --   BILITOT 0.6  --   --   --    ------------------------------------------------------------------------------------------------------------------ estimated creatinine clearance is 70.1 mL/min (by C-G formula based on SCr of 1.17 mg/dL). ------------------------------------------------------------------------------------------------------------------ No results for input(s): HGBA1C in the last 72 hours. ------------------------------------------------------------------------------------------------------------------ No results for input(s): CHOL, HDL, LDLCALC, TRIG, CHOLHDL, LDLDIRECT in the last 72 hours. ------------------------------------------------------------------------------------------------------------------ No results for input(s): TSH, T4TOTAL, T3FREE, THYROIDAB in the last 72 hours.  Invalid input(s): FREET3 ------------------------------------------------------------------------------------------------------------------ No results for input(s): VITAMINB12, FOLATE, FERRITIN, TIBC, IRON, RETICCTPCT in the last 72 hours.  Coagulation profile  Recent Labs Lab 12/15/16 0410  INR 1.23    No results for input(s): DDIMER in the last 72 hours.  Cardiac Enzymes No results for input(s): CKMB, TROPONINI, MYOGLOBIN in the last 168 hours.  Invalid input(s): CK ------------------------------------------------------------------------------------------------------------------ Invalid input(s): [place]  Patient is a 51 year old with HIV with acute mental status changes  1. Acute encephalopathy: Ammonia is slightly abnormal not the cause for his encephalopathy.  Per Dr.  Ola Spurr, Check serum crypto, RPR, TSH: Unremarkable.  LP done,  routine labs plus Crypto ag and CSF VDRL: negative. Follow-up culture. Blood cultures negative. Urinalysis is normal but the urine culture show MRSA. cont vanco, changed zosyn to ceftraixone 2 gm q 12. Per Dr. Ola Spurr DC all abx - no evidence meningitis or infection. MRSA in Urine is likley contaminant given neg UA.  Per Dr. Doy Mince, On [place].  EEG shows no evidence of electrographic seizure.  Initially not suggestive of infection.  Cryptococcal antigen negative. 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 Per Psych consult, Most likely all of the mental status changes are not related to "psychiatric" but rather to "neurologic" illness.   Per Dr. Macie Burows, Would consider doing another EEG and decreasing Keppra more but do not want to do it when EEG not available.  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 Prozac started. Started on Provigil today 100 daily which can lower seizure threshold, discontinue.  2. Seizure activity. The patient had seizure activity this morning, increase Keppra 1500 mg IV twice a day, neuro check every 2 hours, Ativan IV when necessary per neurologist Dr. Doy Mince.  3. Hypothyroidism continue Synthroid, changed to IV.  4. HIV continue HIV meds. 5.DVT prophylaxis, resumed Lovenox.  CKD stage II. Stable.  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  Oral thrush. Was on iv Diflucan. Accelerated hypertension. Unable to take po Norvasc, on IV hydralazine when necessary. BP controlled.  I discussed with  Dr. Doy Mince.    Code Status Orders        Start     Ordered   12/14/16 0156  Full code  Continuous     12/14/16 0155    Code Status History    Date Active Date Inactive Code Status Order ID Comments User Context   04/30/2016 12:53 AM 04/30/2016  5:45 PM Full Code [id number]  Lance Coon, MD Inpatient      Consults  Id ,neuro DVT Prophylaxis  scd's   Lab  Results  Component Value Date   PLT 180 12/18/2016   PLT 150 12/18/2016     Time Spent in minutes   33 min  Greater than 50% of time spent in care coordination and counseling patient regarding the condition and plan of care.   Demetrios Loll M.D on 12/20/2016 at  1:02 PM  Between 7am to 6pm - Pager - [phone]  After 6pm go to [email or web address] - password [email or web address] [facility]   Office  [phone]

## 2016-12-21 LAB — COMPREHENSIVE METABOLIC PANEL
ALBUMIN: 3.7 g/dL (ref 3.5–5.0)
ALK PHOS: 57 U/L (ref 38–126)
ALT: 52 U/L (ref 17–63)
ANION GAP: 9 (ref 5–15)
AST: 57 U/L — ABNORMAL HIGH (ref 15–41)
BUN: 13 mg/dL (ref 6–20)
CALCIUM: 8.9 mg/dL (ref 8.9–10.3)
CO2: 21 mmol/L — AB (ref 22–32)
Chloride: 108 mmol/L (ref 101–111)
Creatinine, Ser: 0.95 mg/dL (ref 0.61–1.24)
GFR calc non Af Amer: >60 mL/min (ref >60)
GLUCOSE: 81 mg/dL (ref 65–99)
POTASSIUM: 3.8 mmol/L (ref 3.5–5.1)
SODIUM: 138 mmol/L (ref 135–145)
TOTAL PROTEIN: 8.4 g/dL — AB (ref 6.5–8.1)
Total Bilirubin: 0.7 mg/dL (ref 0.3–1.2)

## 2016-12-21 LAB — CBC
HCT: 39 % — ABNORMAL LOW (ref 40.0–52.0)
Hemoglobin: 12.9 g/dL — ABNORMAL LOW (ref 13.0–18.0)
MCH: 33.1 pg (ref 26.0–34.0)
MCHC: 33.2 g/dL (ref 32.0–36.0)
MCV: 99.7 fL (ref 80.0–100.0)
Platelets: 189 10*3/uL (ref 150–440)
RBC: 3.91 MIL/uL — ABNORMAL LOW (ref 4.40–5.90)
RDW: 14.2 % (ref 11.5–14.5)
WBC: 7.3 10*3/uL (ref 3.8–10.6)

## 2016-12-21 MED ORDER — LORAZEPAM 2 MG/ML IJ SOLN
2.0000 mg | INTRAMUSCULAR | Status: AC
  Administered 2016-12-21: 2 mg via INTRAVENOUS

## 2016-12-21 MED ORDER — SODIUM CHLORIDE 0.9 % IV SOLN: 200.0000 mg | Freq: Two times a day (BID) | INTRAVENOUS | Status: DC

## 2016-12-21 MED ORDER — LORAZEPAM 2 MG/ML IJ SOLN
INTRAMUSCULAR | Status: AC
  Filled 2016-12-21: qty 1

## 2016-12-21 MED ORDER — LORAZEPAM 2 MG/ML IJ SOLN
2.0000 mg | INTRAMUSCULAR | Status: DC | PRN
  Administered 2016-12-23 (×2): 2 mg via INTRAVENOUS
  Filled 2016-12-21 (×2): qty 1

## 2016-12-21 MED ORDER — LACOSAMIDE 200 MG/20ML IV SOLN
100.0000 mg | Freq: Two times a day (BID) | INTRAVENOUS | Status: DC
  Administered 2016-12-22 (×2): 100 mg via INTRAVENOUS
  Filled 2016-12-21 (×6): qty 10

## 2016-12-21 MED ORDER — SODIUM CHLORIDE 0.9 % IV SOLN
200.0000 mg | Freq: Once | INTRAVENOUS | Status: AC
  Administered 2016-12-21: 200 mg via INTRAVENOUS
  Filled 2016-12-21: qty 20

## 2016-12-21 NOTE — Progress Notes (Signed)
Patient BP 158/113, MD aware. RN gave IV hydralazine. No new orders at this time.  Deri Fuelling

## 2016-12-21 NOTE — Significant Event (Signed)
Rapid Response Event Note  Overview: Time Called: 0500 Arrival Time: 0502 Event Type: Neurologic  Initial Focused Assessment: Rapid response called for patient having seizure like activity.  Per bedside RN, pt was staring off with left sided gaze, making grunting and gurgling sounds.  Bedside RN suctioning patient upon arrival.  Patient has been nonverbal since admission but will track with his eyes, which he is doing at this time.  Per bedside RN, patient had the same seizure like activity yesterday morning as well.  Interventions:  Attending physician paged by nursing supervisor.  Verbal orders received to give 2mg  Ativan at this time.  Dr. Ara Kussmaul to come see patient.  Plan of Care (if not transferred):  Physician at bedside with patient reviewing chart.  Patient stabilized.  Event Summary: Name of Physician Notified: Dr. Ara Kussmaul at [place] in room and stabalized  Event End Time: 0507  Zettie Pho

## 2016-12-21 NOTE — Progress Notes (Signed)
Initial Nutrition Assessment  DOCUMENTATION CODES:   Not applicable  INTERVENTION:  1. Recommend place small bore feeding tube, initiate nutrition support with Jevity 1.2 @ 38mL/hr, increase by 10 every 6 hours to goal rate of 90mL/hr  Provides 2016 calories, 93gm protein, 1356cc free water Recommend an additional 150cc free water q6h to meet free water needs Recommend initiate Daily Weights.  NUTRITION DIAGNOSIS:   Inadequate oral intake related to lethargy/confusion as evidenced by other (see comment) (Per observation). -continues, patient with more seizure activity  GOAL:   Patient will meet greater than or equal to 90% of their needs -not meeting   MONITOR:   I & O's, Labs, Diet advancement, Weight trends  REASON FOR ASSESSMENT:   Low [place]    ASSESSMENT:   Darius [name]  is a 51 y.o. male who presents with Altered mental status. Patient's mother found him slumped over in his chair and unresponsive. Workup here in the states only elevated lactic acid and cocaine positive urine tox screen.  Continues to be NPO, Day 7 now Continues to be lethargic - had seizure like activity early this morning Per Neurology note, does not seem to be improving Will need GOC discussion of nutrition support vs SLP eval - however SLP did not evaluate due to seizure activity previously. Labs and medications reviewed: NaCL + KCL 20 @ 52mL/hr  Diet Order:  Diet NPO time specified Except for: Sips with Meds  Skin:  Reviewed, no issues  Last BM:  PTA  Height:   Ht Readings from Last 1 Encounters:  12/13/16 6' (1.829 m)    Weight:   Wt Readings from Last 1 Encounters:  12/14/16 141 lb 8 oz (64.2 kg)    Ideal Body Weight:  80.9 kg  BMI:  Body mass index is 19.19 kg/m.  Estimated Nutritional Needs:   Kcal:  1950 - 2250 calories  Protein:  83-109 gm  Fluid:  >/= 1.95L  EDUCATION NEEDS:   No education needs identified at this time  Satira Anis. Latrell Potempa, MS, RD  LDN Inpatient Clinical Dietitian Pager [phone]

## 2016-12-21 NOTE — Progress Notes (Signed)
Subjective: Patient with seizure activity overnight.  Keppra continued at 1500mg  q12 hours  Objective: Current vital signs: BP (!) 147/92   Pulse 95   Temp 97.8 F (36.6 C) (Oral)   Resp 16   Ht 6' (1.829 m)   Wt 64.2 kg (141 lb 8 oz)   SpO2 100%   BMI 19.19 kg/m  Vital signs in last 24 hours: Temp:  [97.8 F (36.6 C)-99.3 F (37.4 C)] 97.8 F (36.6 C) (04/04 0824) Pulse Rate:  [82-114] 95 (04/04 1118) Resp:  [16-18] 16 (04/04 0824) BP: (137-173)/(85-113) 147/92 (04/04 1118) SpO2:  [99 %-100 %] 100 % (04/04 0824)  Intake/Output from previous day: 04/03 0701 - 04/04 0700 In: 1178.3 [I.V.:1178.3] Out: -  Intake/Output this shift: Total I/O In: 616.7 [I.V.:386.7; IV Piggyback:230] Out: -  Nutritional status: Diet NPO time specified Except for: Sips with Meds  Neurologic Exam: Mental Status: Eyes open. No verbal response noted. Does not answer any orientation questions. Does not follow commands. Questionable left neglect Cranial Nerves: II: Pupils equal, round, reactive to light and accommodation III,IV, VI: ptosis not present, fixed right gaze deviation V,VII: Facesymmetric VIII: hearing normal bilaterally IX,X: unable to test XI: unable to test XII: unable to test Motor: Localizes to pain with RUE Sensory: Responds to noxious stimuli in all extremities   Lab Results: Basic Metabolic Panel:  Recent Labs Lab 12/16/16 0600 12/18/16 0406 12/21/16 0447  NA  --  142 138  K  --  3.9 3.8  CL  --  112* 108  CO2  --  20* 21*  GLUCOSE  --  80 81  BUN  --  15 13  CREATININE 1.28* 1.17 0.95  CALCIUM  --  8.7* 8.9    Liver Function Tests:  Recent Labs Lab 12/21/16 0447  AST 57*  ALT 52  ALKPHOS 57  BILITOT 0.7  PROT 8.4*  ALBUMIN 3.7   No results for input(s): LIPASE, AMYLASE in the last 168 hours. No results for input(s): AMMONIA in the last 168 hours.  CBC:  Recent Labs Lab 12/18/16 0406 12/21/16 0447  WBC 7.5  7.7 7.3  NEUTROABS 4.7   --   HGB 12.6* 12.9*  HCT 37.7*  36.3* 39.0*  MCV 99.8  98* 99.7  PLT 150  180 189    Cardiac Enzymes: No results for input(s): CKTOTAL, CKMB, CKMBINDEX, TROPONINI in the last 168 hours.  Lipid Panel: No results for input(s): CHOL, TRIG, HDL, CHOLHDL, VLDL, LDLCALC in the last 168 hours.  CBG: No results for input(s): GLUCAP in the last 168 hours.  Microbiology: Results for orders placed or performed during the hospital encounter of 12/13/16  Blood Culture (routine x 2)     Status: None   Collection Time: 12/13/16  8:13 PM  Result Value Ref Range Status   Specimen Description BLOOD R ARM  Final   Special Requests BOTTLES DRAWN AEROBIC AND ANAEROBIC [place]  Final   Culture NO GROWTH 5 DAYS  Final   Report Status 12/18/2016 FINAL  Final  Urine culture     Status: Abnormal   Collection Time: 12/13/16  8:13 PM  Result Value Ref Range Status   Specimen Description URINE, RANDOM  Final   Special Requests NONE  Final   Culture (A)  Final    >=100,000 COLONIES/mL METHICILLIN RESISTANT STAPHYLOCOCCUS AUREUS   Report Status 12/16/2016 FINAL  Final   Organism ID, Bacteria METHICILLIN RESISTANT STAPHYLOCOCCUS AUREUS (A)  Final      Susceptibility  Methicillin resistant staphylococcus aureus - MIC*    CIPROFLOXACIN >=8 RESISTANT Resistant     GENTAMICIN >=16 RESISTANT Resistant     NITROFURANTOIN <=16 SENSITIVE Sensitive     OXACILLIN >=4 RESISTANT Resistant     TETRACYCLINE <=1 SENSITIVE Sensitive     VANCOMYCIN 1 SENSITIVE Sensitive     TRIMETH/SULFA <=10 SENSITIVE Sensitive     CLINDAMYCIN >=8 RESISTANT Resistant     RIFAMPIN <=0.5 SENSITIVE Sensitive     Inducible Clindamycin NEGATIVE Sensitive     * >=100,000 COLONIES/mL METHICILLIN RESISTANT STAPHYLOCOCCUS AUREUS  Blood Culture (routine x 2)     Status: None   Collection Time: 12/13/16 10:24 PM  Result Value Ref Range Status   Specimen Description BLOOD  R ARM  Final   Special Requests BOTTLES DRAWN AEROBIC AND  ANAEROBIC  BCAV  Final   Culture NO GROWTH 5 DAYS  Final   Report Status 12/18/2016 FINAL  Final  CSF culture     Status: None   Collection Time: 12/15/16 11:30 AM  Result Value Ref Range Status   Specimen Description CSF  Final   Special Requests NONE  Final   Gram Stain   Final    MODERATE RED BLOOD CELLS PRESENT RARE WBC SEEN NO ORGANISMS SEEN    Culture   Final    NO GROWTH 3 DAYS Performed at [facility], [address] [place], [place] [address]    Report Status 12/19/2016 FINAL  Final  Culture, fungus without smear     Status: None (Preliminary result)   Collection Time: 12/15/16 11:30 AM  Result Value Ref Range Status   Specimen Description CSF  Final   Special Requests NONE  Final   Culture   Final    NO FUNGUS ISOLATED AFTER 3 DAYS Performed at [facility], [place] [address] [place], [place] [address]    Report Status PENDING  Incomplete    Coagulation Studies: No results for input(s): LABPROT, INR in the last 72 hours.  Imaging: No results found.  Medications:  I have reviewed the patient's current medications. Scheduled: . abacavir-dolutegravir-lamiVUDine  1 tablet Oral Daily  . amLODipine  10 mg Oral Daily  . chlorhexidine  15 mL Mouth Rinse BID  . enoxaparin (LOVENOX) injection  40 mg Subcutaneous Q24H  . FLUoxetine  10 mg Oral Daily  . [START ON 12/22/2016] lacosamide (VIMPAT) IV  100 mg Intravenous Q12H  . lacosamide (VIMPAT) IV  200 mg Intravenous Once  . levETIRAcetam  1,500 mg Intravenous Q12H  . levothyroxine  25 mcg Intravenous Daily  . lidocaine (PF)  5 mL Other Once  . LORazepam      . mouth rinse  15 mL Mouth Rinse q12n4p    Assessment/Plan: Patient with breakthrough seizure activity overnight despite increase in Keppra to 3000mg  daily.  Although patient noted to have left eye deviation with seizures on yesterday, today with exam has right eye deviation.    Recommendations: 1.  Start Vimpat.  200mg  IV load now with 100mg  IV  maintenance q12 hours.   2.  Continue Keppra at 1500mg  q 12 hours 3.  Continue seizure precautions   LOS: 1 day   Alexis Goodell, MD Neurology [phone] 12/21/2016  12:31 PM

## 2016-12-21 NOTE — Progress Notes (Signed)
Advanced Care Plan.  Purpose of Encounter:  Parties in Attendance:  Patient's Decisional Capacity: No. 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 Patient also had multiple times of seizure activity. He is still unresponsive and cannot eat. He has a very poor prognosis I discussed the patient's condition and poor prognosis with the patient's mother. I discussed with her about palliative care. She will consider and discuss again tomorrow.  Plan:  Palliative care consult. Time spent discussing advance care planning: 18 minutes.

## 2016-12-21 NOTE — Progress Notes (Signed)
Pt. Having seizure like activity. VSS. No change in neurochecks.. Dr. Ara Kussmaul came to see pt. And order for ativan 2mg  given.  Pt. Resting quietly at this time.;

## 2016-12-21 NOTE — Progress Notes (Signed)
  Speech Language Pathology Treatment: Dysphagia  Patient Details Name: Darius [name] MRN: [id number] DOB: 07-Jul-1966 Today's Date: 12/21/2016 Time: 1150-1230 SLP Time Calculation (min) (ACUTE ONLY): 40 min  Assessment / Plan / Recommendation Clinical Impression  Pt seen for ongoing assessment for toleration of an oral diet of least restrictive consistency. Pt had Seizure activity overnight again per Neurology note. NSG has been doing frequent oral care for hygiene and oral stimulation for swallowing. Pt was nonverbal and did not engage w/ SLP. Upon presentation of po trials, pt exhibited no oral awareness or response to stimulation of po's put to lips. SLP attempted several trial consistencies placed to/on lips w/ no oral or lingual response - pt did NOT lick at lips or open mouth to accept po's. He then turned his head away/downward w/ eyes closed and did not engage w/ SLP. Max verbal/tactile/visual cues given w/ no response or interest given to take po's w/ SLP. No further trials attempted. NSG/Dietitian updated; MD consulted w/ tx session results and recommendations for alternative means of feeding if appropriate d/t length of time pt has not engaged in dysphagia tx session/po intake; Palliative Care consult. ST services will continue to f/u w/ pt's status and present po trials to hopefully establish least restrictive diet consistency.   HPI        SLP Plan  Continue with current plan of care;Consult other service (comment) (palliative care; Dietitian for alternative means of feeding)       Recommendations  Diet recommendations: NPO Medication Administration: Via alternative means  Frequent oral care for hygiene and stimulation to encourage swallowing                General recommendations:  (Palliative Care; Dietitian) Oral Care Recommendations: Oral care QID;Staff/trained caregiver to provide oral care Follow up Recommendations:  (TBD) SLP Visit Diagnosis: Dysphagia,  oropharyngeal phase (R13.12) Plan: Continue with current plan of care;Consult other service (comment) (palliative care; Dietitian for alternative means of feeding)       GO                Orinda Kenner, [place], CCC-SLP Watson,Katherine 12/21/2016, 2:17 PM

## 2016-12-21 NOTE — Progress Notes (Signed)
[place] at [facility]                                                                                                                                                                                  Patient Demographics   Darius [name], is a 51 y.o. male, DOB - 1966/06/02, KXF:[id number]  Admit date - 12/13/2016   Admitting Physician Lance Coon, MD  Outpatient Primary MD for the patient is Pcp Not In System   LOS - 1  Subjective: Patient admitted with acute encephalopathy. He is awake but still very confused. And not able to communicate much. His mother is at bedside. His mother reports that patient is disabled and lives at home alone but normally he is awake alert and communicative. He does have HIV and she states that last was seen and [facility]. His viral load was undetectable. According to her he is been taking his meds.  The patient had seizure last night. He is calm, limited response to verbal stimuli. Opened mouth and move right arm and leg. But he could not open mouth to eat per speech study staff.  Review of Systems:   CONSTITUTIONAL: Unable to provide  Vitals:   Vitals:   12/21/16 0824 12/21/16 1005 12/21/16 1007 12/21/16 1118  BP: (!) 158/113 (!) 173/107 (!) 164/112 (!) 147/92  Pulse: 91 86 82 95  Resp: 16     Temp: 97.8 F (36.6 C)     TempSrc: Oral     SpO2: 100%     Weight:      Height:        Wt Readings from Last 3 Encounters:  12/14/16 141 lb 8 oz (64.2 kg)  04/30/16 145 lb (65.8 kg)     Intake/Output Summary (Last 24 hours) at 12/21/16 1522 Last data filed at 12/21/16 1200  Gross per 24 hour  Intake             1430 ml  Output                0 ml  Net             1430 ml    Physical Exam:   GENERAL: Pleasant-appearing in no apparent distress. He is confused HEAD, EYES, EARS, NOSE AND THROAT: Atraumatic, normocephalic. . Pupils equal and reactive to light. Sclerae anicteric. No conjunctival  injection. Oral thrush. NECK: Supple. There is no jugular venous distention. No bruits, no lymphadenopathy, no thyromegaly.  HEART: Regular rate and rhythm,. No murmurs, no rubs, no clicks.  LUNGS: Clear to auscultation bilaterally. No rales or rhonchi. No wheezes.  ABDOMEN: Soft, flat, nontender, nondistended. Has  good bowel sounds. No hepatosplenomegaly appreciated.  EXTREMITIES: No evidence of any cyanosis, clubbing, or peripheral edema.  +2 pedal and radial pulses bilaterally.  NEUROLOGIC: He is calm, limited response to verbal stimuli. Opened mouth and move right arm and leg. SKIN: Moist and warm with no rashes appreciated.  Psych: Not anxious, depressed LN: No inguinal LN enlargement    Antibiotics   Anti-infectives    Start     Dose/Rate Route Frequency Ordered Stop   12/18/16 1800  vancomycin (VANCOCIN) 750 mg in dextrose 5 % 150 mL IVPB  Status:  Discontinued     750 mg 150 mL/hr over 60 Minutes Intravenous Every 12 hours 12/18/16 0902 12/19/16 1535   12/17/16 1800  vancomycin (VANCOCIN) IVPB 750 mg/150 ml premix  Status:  Discontinued     750 mg 150 mL/hr over 60 Minutes Intravenous Every 12 hours 12/17/16 1122 12/18/16 0853   12/16/16 1200  fluconazole (DIFLUCAN) IVPB 100 mg     100 mg 50 mL/hr over 60 Minutes Intravenous Every 24 hours 12/16/16 1140 12/18/16 1254   12/15/16 1630  vancomycin (VANCOCIN) IVPB 1000 mg/200 mL premix  Status:  Discontinued     1,000 mg 200 mL/hr over 60 Minutes Intravenous Every 12 hours 12/15/16 0522 12/15/16 0920   12/15/16 1630  vancomycin (VANCOCIN) IVPB 750 mg/150 ml premix  Status:  Discontinued     750 mg 150 mL/hr over 60 Minutes Intravenous Every 12 hours 12/15/16 0920 12/15/16 0930   12/15/16 1630  vancomycin (VANCOCIN) 750 mg in sodium chloride 0.9 % 150 mL IVPB  Status:  Discontinued     750 mg 150 mL/hr over 60 Minutes Intravenous Every 12 hours 12/15/16 0930 12/17/16 1122   12/15/16 0530  vancomycin (VANCOCIN) 250 mg in sodium  chloride 0.9 % 100 mL IVPB  Status:  Discontinued     250 mg 100 mL/hr over 60 Minutes Intravenous  Once 12/15/16 0523 12/15/16 0525   12/14/16 1430  cefTRIAXone (ROCEPHIN) 2 g in dextrose 5 % 50 mL IVPB  Status:  Discontinued     2 g 100 mL/hr over 30 Minutes Intravenous Every 12 hours 12/14/16 1426 12/19/16 1535   12/14/16 1000  abacavir-dolutegravir-lamiVUDine (TRIUMEQ) 600-50-300 MG per tablet 1 tablet     1 tablet Oral Daily 12/14/16 0155     12/14/16 0600  piperacillin-tazobactam (ZOSYN) IVPB 3.375 g  Status:  Discontinued     3.375 g 12.5 mL/hr over 240 Minutes Intravenous Every 8 hours 12/13/16 2232 12/14/16 1426   12/14/16 0430  vancomycin (VANCOCIN) IVPB 750 mg/150 ml premix  Status:  Discontinued     750 mg 150 mL/hr over 60 Minutes Intravenous Every 12 hours 12/13/16 2232 12/15/16 0522   12/13/16 2215  piperacillin-tazobactam (ZOSYN) IVPB 3.375 g     3.375 g 100 mL/hr over 30 Minutes Intravenous  Once 12/13/16 2212 12/13/16 2310   12/13/16 2215  vancomycin (VANCOCIN) IVPB 1000 mg/200 mL premix     1,000 mg 200 mL/hr over 60 Minutes Intravenous  Once 12/13/16 2212 12/13/16 2325      Medications   Scheduled Meds: . abacavir-dolutegravir-lamiVUDine  1 tablet Oral Daily  . amLODipine  10 mg Oral Daily  . chlorhexidine  15 mL Mouth Rinse BID  . enoxaparin (LOVENOX) injection  40 mg Subcutaneous Q24H  . FLUoxetine  10 mg Oral Daily  . [START ON 12/22/2016] lacosamide (VIMPAT) IV  100 mg Intravenous Q12H  . levETIRAcetam  1,500 mg Intravenous Q12H  . levothyroxine  25 mcg Intravenous Daily  . lidocaine (PF)  5 mL Other Once  . LORazepam      . mouth rinse  15 mL Mouth Rinse q12n4p   Continuous Infusions: . 0.9 % NaCl with KCl 20 mEq / L 50 mL/hr at 12/19/16 1058   PRN Meds:.acetaminophen **OR** acetaminophen, hydrALAZINE, LORazepam, ondansetron **OR** ondansetron (ZOFRAN) IV   Data Review:   Micro Results Recent Results (from the past 240 hour(s))  Blood Culture  (routine x 2)     Status: None   Collection Time: 12/13/16  8:13 PM  Result Value Ref Range Status   Specimen Description BLOOD R ARM  Final   Special Requests BOTTLES DRAWN AEROBIC AND ANAEROBIC [place]  Final   Culture NO GROWTH 5 DAYS  Final   Report Status 12/18/2016 FINAL  Final  Urine culture     Status: Abnormal   Collection Time: 12/13/16  8:13 PM  Result Value Ref Range Status   Specimen Description URINE, RANDOM  Final   Special Requests NONE  Final   Culture (A)  Final    >=100,000 COLONIES/mL METHICILLIN RESISTANT STAPHYLOCOCCUS AUREUS   Report Status 12/16/2016 FINAL  Final   Organism ID, Bacteria METHICILLIN RESISTANT STAPHYLOCOCCUS AUREUS (A)  Final      Susceptibility   Methicillin resistant staphylococcus aureus - MIC*    CIPROFLOXACIN >=8 RESISTANT Resistant     GENTAMICIN >=16 RESISTANT Resistant     NITROFURANTOIN <=16 SENSITIVE Sensitive     OXACILLIN >=4 RESISTANT Resistant     TETRACYCLINE <=1 SENSITIVE Sensitive     VANCOMYCIN 1 SENSITIVE Sensitive     TRIMETH/SULFA <=10 SENSITIVE Sensitive     CLINDAMYCIN >=8 RESISTANT Resistant     RIFAMPIN <=0.5 SENSITIVE Sensitive     Inducible Clindamycin NEGATIVE Sensitive     * >=100,000 COLONIES/mL METHICILLIN RESISTANT STAPHYLOCOCCUS AUREUS  Blood Culture (routine x 2)     Status: None   Collection Time: 12/13/16 10:24 PM  Result Value Ref Range Status   Specimen Description BLOOD  R ARM  Final   Special Requests BOTTLES DRAWN AEROBIC AND ANAEROBIC  BCAV  Final   Culture NO GROWTH 5 DAYS  Final   Report Status 12/18/2016 FINAL  Final  CSF culture     Status: None   Collection Time: 12/15/16 11:30 AM  Result Value Ref Range Status   Specimen Description CSF  Final   Special Requests NONE  Final   Gram Stain   Final    MODERATE RED BLOOD CELLS PRESENT RARE WBC SEEN NO ORGANISMS SEEN    Culture   Final    NO GROWTH 3 DAYS Performed at [facility], [address], [place], [place] [address]    Report  Status 12/19/2016 FINAL  Final  Culture, fungus without smear     Status: None (Preliminary result)   Collection Time: 12/15/16 11:30 AM  Result Value Ref Range Status   Specimen Description CSF  Final   Special Requests NONE  Final   Culture   Final    NO FUNGUS ISOLATED AFTER 3 DAYS Performed at [facility], [place] [address], [place], [place] [address]    Report Status PENDING  Incomplete    Radiology Reports Ct Head Wo Contrast  Result Date: 12/13/2016 CLINICAL DATA:  Acute onset of generalized weakness. Patient not speaking. Initial encounter. EXAM: CT HEAD WITHOUT CONTRAST TECHNIQUE: Contiguous axial images were obtained from the base of the skull through the vertex  without intravenous contrast. COMPARISON:  CT of the head performed 04/29/2016 FINDINGS: Brain: No evidence of acute infarction, hemorrhage, hydrocephalus, extra-axial collection or mass lesion/mass effect. Prominence of the ventricles and sulci reflects moderate cortical volume loss. Mild cerebellar atrophy is noted. Scattered periventricular and subcortical white matter change likely reflects small vessel ischemic microangiopathy. Chronic encephalomalacia is noted at the left basal ganglia, and there is ex vacuo dilatation of the left lateral ventricle. Calcification is seen at the pons. No mass effect or midline shift is seen. Chronic subdural thickening is noted on the left side. Vascular: No hyperdense vessel or unexpected calcification. Skull: There is no evidence of fracture; the patient is status post left frontoparietal craniotomy. Sinuses/Orbits: The visualized portions of the orbits are within normal limits. The paranasal sinuses and mastoid air cells are well-aerated. Other: No significant soft tissue abnormalities are seen. IMPRESSION: 1. No acute intracranial pathology seen on CT. 2. Moderate cortical volume loss and scattered small vessel ischemic microangiopathy. 3. Chronic encephalomalacia at the left basal  ganglia, with ex vacuo dilatation of the left lateral ventricle. Chronic subdural thickening on the left side. Electronically Signed   By: Garald Balding M.D.   On: 12/13/2016 23:02   Dg Chest Port 1 View  Result Date: 12/13/2016 CLINICAL DATA:  Sepsis EXAM: PORTABLE CHEST 1 VIEW COMPARISON:  04/29/2014 FINDINGS: Cardiomediastinal silhouette is stable. No infiltrate or pleural effusion. No pulmonary edema. Bony thorax is unremarkable. IMPRESSION: No active disease. Electronically Signed   By: Lahoma Crocker M.D.   On: 12/13/2016 21:41   Dg Fluoro Guided Loc Of Needle/cath Tip For Spinal Inject Lt  Result Date: 12/15/2016 CLINICAL DATA:  Abnormal mental status. EXAM: DIAGNOSTIC LUMBAR PUNCTURE UNDER FLUOROSCOPIC GUIDANCE FLUOROSCOPY TIME:  Fluoroscopy Time:  0 minutes 20 seconds Radiation Exposure Index (if provided by the fluoroscopic device): 5.4 mGy Number of Acquired Spot Images: 1 PROCEDURE: Patient unable to give consent. After discussing the risks and benefits of this procedure with the patient's mother informed consent was obtained. Back was sterilely prepped and draped. Local anesthesia administered 1% lidocaine. 22 gauge needle was advanced into the lumbar canal and clear 9 cc of clear CSF removed and sent for the requested labs. Opening pressure was 9 cm of water. Needle was removed. Hemostasis achieved. No complications. IMPRESSION: Successful fluoroscopically directed lumbar puncture. Electronically Signed   By: Marcello Moores  Register   On: 12/15/2016 12:40     CBC  Recent Labs Lab 12/18/16 0406 12/21/16 0447  WBC 7.5  7.7 7.3  HGB 12.6* 12.9*  HCT 37.7*  36.3* 39.0*  PLT 150  180 189  MCV 99.8  98* 99.7  MCH 32.8  33.4 33.1  MCHC 33.3  33.5 33.2  RDW 14.1  14.0 14.2  LYMPHSABS 1.8  --   EOSABS 0.2  --   BASOSABS 0.0  --     Chemistries   Recent Labs Lab 12/16/16 0600 12/18/16 0406 12/21/16 0447  NA  --  142 138  K  --  3.9 3.8  CL  --  112* 108  CO2  --  20* 21*   GLUCOSE  --  80 81  BUN  --  15 13  CREATININE 1.28* 1.17 0.95  CALCIUM  --  8.7* 8.9  AST  --   --  57*  ALT  --   --  52  ALKPHOS  --   --  57  BILITOT  --   --  0.7   ------------------------------------------------------------------------------------------------------------------ estimated creatinine  clearance is 86.4 mL/min (by C-G formula based on SCr of 0.95 mg/dL). ------------------------------------------------------------------------------------------------------------------ No results for input(s): HGBA1C in the last 72 hours. ------------------------------------------------------------------------------------------------------------------ No results for input(s): CHOL, HDL, LDLCALC, TRIG, CHOLHDL, LDLDIRECT in the last 72 hours. ------------------------------------------------------------------------------------------------------------------ No results for input(s): TSH, T4TOTAL, T3FREE, THYROIDAB in the last 72 hours.  Invalid input(s): FREET3 ------------------------------------------------------------------------------------------------------------------ No results for input(s): VITAMINB12, FOLATE, FERRITIN, TIBC, IRON, RETICCTPCT in the last 72 hours.  Coagulation profile  Recent Labs Lab 12/15/16 0410  INR 1.23    No results for input(s): DDIMER in the last 72 hours.  Cardiac Enzymes No results for input(s): CKMB, TROPONINI, MYOGLOBIN in the last 168 hours.  Invalid input(s): CK ------------------------------------------------------------------------------------------------------------------ Invalid input(s): [place]  Patient is a 51 year old with HIV with acute mental status changes  1. Acute encephalopathy: Ammonia is slightly abnormal not the cause for his encephalopathy.  Per Dr. Ola Spurr, Check serum crypto, RPR, TSH: Unremarkable.  LP done,  routine labs plus Crypto ag and CSF VDRL: negative. Follow-up culture. Blood  cultures negative. Urinalysis is normal but the urine culture show MRSA. cont vanco, changed zosyn to ceftraixone 2 gm q 12. Per Dr. Ola Spurr DC all abx - no evidence meningitis or infection. MRSA in Urine is likley contaminant given neg UA.  Per Dr. Doy Mince, [place].  EEG shows no evidence of electrographic seizure.  Initially not suggestive of infection.  Cryptococcal antigen negative. 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 Per Psych consult, Most likely all of the mental status changes are not related to "psychiatric" but rather to "neurologic" illness.   Per Dr. Macie Burows, Would consider doing another EEG and decreasing Keppra more but do not want to do it when EEG not available.  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 Prozac started. Started on Provigil today 100 daily which can lower seizure threshold, discontinue.  2. Seizure activity. No seizure activity this morning,    neuro check every 2 hours, Ativan IV when necessary, Start Vimpat.  200mg  IV load now with 100mg  IV maintenance q12 hours.  Continue Keppra at 1500mg  q 12 hours per neurologist Dr. Doy Mince.  3. Hypothyroidism continue Synthroid, changed to IV.  4. HIV continue HIV meds. 5.DVT prophylaxis, resumed Lovenox.  CKD stage II. Stable.  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  Oral thrush. Was on iv Diflucan. Accelerated hypertension. Unable to take po Norvasc, on IV hydralazine when necessary. BP controlled.  I discussed with  Dr. Doy Mince and Dr. Ola Spurr.  I discussed the patient's condition and poor prognosis with the patient's mother. I discussed with her about palliative care and PEG tube placement if she'll agree.    Code Status Orders        Start     Ordered   12/14/16 0156  Full code  Continuous     12/14/16 0155    Code Status History    Date Active Date Inactive Code Status Order ID Comments User Context   04/30/2016 12:53 AM 04/30/2016  5:45 PM Full Code  [id number]  Lance Coon, MD Inpatient      Consults  Id ,neuro DVT Prophylaxis  scd's   Lab Results  Component Value Date   PLT 189 12/21/2016     Time Spent in minutes   37 min  Greater than 50% of time spent in care coordination and counseling patient regarding the condition and plan of care.   Demetrios Loll M.D on 12/21/2016 at 3:22  PM  Between 7am to 6pm - Pager - [phone]  After 6pm go to [email or web address] - password [email or web address] [facility]   Office  [phone]

## 2016-12-22 ENCOUNTER — Inpatient Hospital Stay: Payer: Medicare Other

## 2016-12-22 DIAGNOSIS — B20 Human immunodeficiency virus [HIV] disease: Secondary | ICD-10-CM

## 2016-12-22 DIAGNOSIS — Z7189 Other specified counseling: Secondary | ICD-10-CM

## 2016-12-22 DIAGNOSIS — R4182 Altered mental status, unspecified: Secondary | ICD-10-CM

## 2016-12-22 DIAGNOSIS — Z515 Encounter for palliative care: Secondary | ICD-10-CM

## 2016-12-22 LAB — GLUCOSE, CAPILLARY: GLUCOSE-CAPILLARY: 90 mg/dL (ref 65–99)

## 2016-12-22 LAB — LEVETIRACETAM LEVEL: Levetiracetam Lvl: 37.4 ug/mL (ref 10.0–40.0)

## 2016-12-22 MED ORDER — SODIUM CHLORIDE 0.9 % IV SOLN
1500.0000 mg | Freq: Two times a day (BID) | INTRAVENOUS | Status: DC
  Administered 2016-12-22 – 2016-12-28 (×12): 1500 mg via INTRAVENOUS
  Filled 2016-12-22 (×14): qty 15

## 2016-12-22 MED ORDER — JEVITY 1.2 CAL PO LIQD: 1000.0000 mL | ORAL | Status: DC

## 2016-12-22 MED ORDER — LACOSAMIDE 50 MG PO TABS: 150.0000 mg | ORAL_TABLET | Freq: Two times a day (BID) | ORAL | Status: DC

## 2016-12-22 MED ORDER — LACOSAMIDE 50 MG PO TABS: 100.0000 mg | ORAL_TABLET | Freq: Two times a day (BID) | ORAL | Status: DC

## 2016-12-22 MED ORDER — FREE WATER
150.0000 mL | Freq: Four times a day (QID) | Status: DC
  Administered 2016-12-23 – 2016-12-25 (×8): 150 mL

## 2016-12-22 MED ORDER — FLUOXETINE HCL 20 MG/5ML PO SOLN
10.0000 mg | Freq: Every day | ORAL | Status: DC
  Administered 2016-12-23 – 2016-12-29 (×4): 10 mg
  Filled 2016-12-22 (×7): qty 5

## 2016-12-22 MED ORDER — LEVETIRACETAM 100 MG/ML PO SOLN
1500.0000 mg | Freq: Two times a day (BID) | ORAL | Status: DC
  Filled 2016-12-22 (×2): qty 15

## 2016-12-22 MED ORDER — AMLODIPINE BESYLATE 10 MG PO TABS
10.0000 mg | ORAL_TABLET | Freq: Every day | ORAL | Status: DC
  Administered 2016-12-23 – 2016-12-25 (×3): 10 mg via NASOGASTRIC
  Filled 2016-12-22 (×3): qty 1

## 2016-12-22 NOTE — Progress Notes (Signed)
Attempted to place NG tube with no success. Pt unable to follow commands and swallow during insertion. MD notified and order received for interventional radiology to place NG.

## 2016-12-22 NOTE — Consult Note (Signed)
[facility] Consult   Reason for Consult:  Consult for 51 year old man HIV-positive history of drug abuse currently in the hospital with altered mental status that is persistent. Referring Physician:  Bridgett Larsson Patient Identification: Darius [name] MRN:  [id number] Principal Diagnosis: Altered mental state Diagnosis:   Patient Active Problem List   Diagnosis Date Noted  . Palliative care by specialist [Z51.5]   . Goals of care, counseling/discussion [Z71.89]   . Acute encephalopathy [G93.40]   . 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 HIV (human immunodeficiency virus infection) ([place]) [B20] 04/29/2016  . Altered mental state [R41.82] 04/29/2016  . Drug abuse [F19.10] 04/29/2016  . Hypothyroidism [E03.9] 04/29/2016  . Seizures ([place]) [R56.9] 04/29/2016    Total Time spent with patient: 15 minutes  Subjective:   Darius [name] is a 51 y.o. male patient admitted with patient not able to give any information.  Follow-up for Thursday, April 5. Patient seen. There has been no improvement or change in his mental state. Appears to be chronically severely impaired. No sign of any actively treated psychiatric illness.  HPI:  Attempted to interview patient. Chart reviewed. Patient known to me from one prior encounter. 51 year old man with a history of HIV positive who was brought into the hospital with dose denies. Patient has not apparently returned to his baseline yet. On examination today I found the patient to be apparently awake but not really responding to me at all. Eyes were open and were deviated to the right as was all of his attention. Patient would not follow my finger with his eyes. Would not respond to 1 step commands. Did not speak. Unclear if he was paying attention to me at all. On admission drug screen positive for cocaine.  Social history: Lives with his mother. HIV positive.  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 Has a history of seizures as well. So far medical workup has not found any specific infection.  Substance abuse history: Known history of abuse of cocaine and possibly other drugs not able to give any immediate history now.  Past Psychiatric History: Other than substance abuse no identified past psychiatric history. No history of hospitalization no history of suicide attempts or violence. I saw this patient back in August 2017 under similar circumstances although he was returning to his basement line more quickly. Nevertheless even at that point his baseline was quite impaired although he was able to communicate a little better than he is now.  Risk to Self: Is patient at risk for suicide?: No Risk to Others:   Prior Inpatient Therapy:   Prior Outpatient Therapy:    Past Medical History:  Past Medical History:  Diagnosis Date  . CKD (chronic kidney disease), stage III   . CNS lymphoma ([place])   . Drug abuse   . Hepatitis C   . HIV (human immunodeficiency virus infection) ([place])   . Hypothyroidism   . Seizures ([place])     Past Surgical History:  Procedure Laterality Date  . CRANIOTOMY     Family History:  Family History  Problem Relation Age of Onset  . Family history unknown: Yes   Family Psychiatric  History: Unknown Social History:  History  Alcohol Use No     History  Drug Use    Social History   Social History  . Marital status:  Unknown    Spouse name: N/A  . Number of children: N/A  . Years of education: N/A   Social History Main Topics  . Smoking status: Current Every Day Smoker  . Smokeless tobacco: Never Used  . Alcohol use No  . Drug use: Yes  . Sexual activity: Not Asked   Other Topics Concern  . None   Social History Narrative  . None   Additional Social History:    Allergies:   Allergies  Allergen Reactions  . Asa [Aspirin] Other (See  Comments)    Reaction: unknown  . Sulfa Antibiotics Other (See Comments)    Reaction: unknown    Labs:  Results for orders placed or performed during the hospital encounter of 12/13/16 (from the past 48 hour(s))  CBC     Status: Abnormal   Collection Time: 12/21/16  4:47 AM  Result Value Ref Range   WBC 7.3 3.8 - 10.6 K/uL   RBC 3.91 (L) 4.40 - 5.90 MIL/uL   Hemoglobin 12.9 (L) 13.0 - 18.0 g/dL   HCT 39.0 (L) 40.0 - 52.0 %   MCV 99.7 80.0 - 100.0 fL   MCH 33.1 26.0 - 34.0 pg   MCHC 33.2 32.0 - 36.0 g/dL   RDW 14.2 11.5 - 14.5 %   Platelets 189 150 - 440 K/uL  Comprehensive metabolic panel     Status: Abnormal   Collection Time: 12/21/16  4:47 AM  Result Value Ref Range   Sodium 138 135 - 145 mmol/L   Potassium 3.8 3.5 - 5.1 mmol/L   Chloride 108 101 - 111 mmol/L   CO2 21 (L) 22 - 32 mmol/L   Glucose, Bld 81 65 - 99 mg/dL   BUN 13 6 - 20 mg/dL   Creatinine, Ser 0.95 0.61 - 1.24 mg/dL   Calcium 8.9 8.9 - 10.3 mg/dL   Total Protein 8.4 (H) 6.5 - 8.1 g/dL   Albumin 3.7 3.5 - 5.0 g/dL   AST 57 (H) 15 - 41 U/L   ALT 52 17 - 63 U/L   Alkaline Phosphatase 57 38 - 126 U/L   Total Bilirubin 0.7 0.3 - 1.2 mg/dL   GFR calc non Af Amer >60 >60 mL/min   GFR calc Af Amer >60 >60 mL/min    Comment: (NOTE) The eGFR has been calculated using the CKD EPI equation. This calculation has not been validated in all clinical situations. eGFR's persistently <60 mL/min signify possible Chronic Kidney Disease.    Anion gap 9 5 - 15  Levetiracetam level     Status: None   Collection Time: 12/21/16  4:47 AM  Result Value Ref Range   Levetiracetam Lvl 37.4 10.0 - 40.0 ug/mL    Comment: (NOTE) Performed At: [facility] [place], [place] [id number] Lindon Romp MD EX:[id number]     Current Facility-Administered Medications  Medication Dose Route Frequency Provider Last Rate Last Dose  . 0.9 % NaCl with KCl 20 mEq/ L  infusion   Intravenous Continuous Demetrios Loll, MD  50 mL/hr at 12/22/16 0205    . abacavir-dolutegravir-lamiVUDine (TRIUMEQ) 600-50-300 MG per tablet 1 tablet  1 tablet Oral Daily Lance Coon, MD      . acetaminophen (TYLENOL) tablet 650 mg  650 mg Oral Q6H PRN Lance Coon, MD       Or  . acetaminophen (TYLENOL) suppository 650 mg  650 mg Rectal Q6H PRN Lance Coon, MD   650 mg at 12/14/16 0845  .  amLODipine (NORVASC) tablet 10 mg  10 mg Per NG tube Daily Demetrios Loll, MD      . chlorhexidine (PERIDEX) 0.12 % solution 15 mL  15 mL Mouth Rinse BID Lance Coon, MD   15 mL at 12/22/16 1033  . enoxaparin (LOVENOX) injection 40 mg  40 mg Subcutaneous Q24H Demetrios Loll, MD   40 mg at 12/21/16 2135  . feeding supplement (JEVITY 1.2 CAL) liquid 1,000 mL  1,000 mL Per Tube Continuous Demetrios Loll, MD      . Derrill Memo ON 12/23/2016] FLUoxetine (PROZAC) 20 MG/5ML solution 10 mg  10 mg Per Tube Daily Demetrios Loll, MD      . free water 150 mL  150 mL Per Tube Q6H Demetrios Loll, MD      . hydrALAZINE (APRESOLINE) injection 10 mg  10 mg Intravenous Q6H PRN Vaughan Basta, MD   10 mg at 12/22/16 0801  . [START ON 12/23/2016] lacosamide (VIMPAT) tablet 100 mg  100 mg Per Tube Q12H Demetrios Loll, MD      . levETIRAcetam (KEPPRA) 100 MG/ML solution 1,500 mg  1,500 mg Per Tube Q12H Demetrios Loll, MD      . lidocaine (PF) (XYLOCAINE) 1 % injection 5 mL  5 mL Other Once Dustin Flock, MD      . LORazepam (ATIVAN) injection 2 mg  2 mg Intravenous Q2H PRN Demetrios Loll, MD      . MEDLINE mouth rinse  15 mL Mouth Rinse q12n4p Lance Coon, MD   15 mL at 12/22/16 1426  . ondansetron (ZOFRAN) tablet 4 mg  4 mg Oral Q6H PRN Lance Coon, MD       Or  . ondansetron [facility]) injection 4 mg  4 mg Intravenous Q6H PRN Lance Coon, MD        Musculoskeletal: Strength & Muscle Tone: decreased Gait & Station: unable to stand Patient leans: Backward  Psychiatric Specialty Exam: Physical Exam  Nursing note and vitals reviewed. Constitutional: He appears well-developed.  HENT:  Head:  Normocephalic and atraumatic.  Eyes: Conjunctivae are normal. Pupils are equal, round, and reactive to light.  Neck: Normal range of motion.  Cardiovascular: Regular rhythm and normal heart sounds.   Respiratory: Effort normal.  GI: Soft.  Musculoskeletal: Normal range of motion.  Neurological: He is alert.  Eyes and attention deviated to the right. No spontaneous movement of his left upper extremity. Only slow and awkward movements of the right upper extremity.  Skin: Skin is warm and dry.  Psychiatric: His affect is blunt. He is withdrawn. He is noncommunicative.    Review of Systems  Unable to perform ROS: Mental status change    Blood pressure (!) 159/89, pulse 98, temperature 98.9 F (37.2 C), temperature source Oral, resp. rate 18, height 6' (1.829 m), weight 64.2 kg (141 lb 8 oz), SpO2 96 %.Body mass index is 19.19 kg/m.  General Appearance: Disheveled  Eye Contact:  None  Speech:  Negative  Volume:  Decreased  Mood:  Negative  Affect:  Negative  Thought Process:  NA  Orientation:  Negative  Thought Content:  Negative  Suicidal Thoughts:  No  Homicidal Thoughts:  No  Memory:  Negative  Judgement:  Negative  Insight:  Negative  Psychomotor Activity:  NA  Concentration:  Concentration: Negative  Recall:  Negative  Fund of Knowledge:  Negative  Language:  Negative  Akathisia:  Negative  Handed:  Right  AIMS (if indicated):     Assets:  Catering manager Housing Social  Support  ADL's:  Impaired  Cognition:  Impaired,  Severe  Sleep:        Treatment Plan Summary: Plan Signing off for now. This appears to be advanced dementia related to his history of HIV positive and drug abuse. No need for further psychiatric care specifically.  Disposition: Patient does not meet criteria for psychiatric inpatient admission.  Alethia Berthold, MD 12/22/2016 5:37 PM

## 2016-12-22 NOTE — Progress Notes (Signed)
Nutrition Follow-up  DOCUMENTATION CODES:   Not applicable  INTERVENTION:  1. Begin Jevity 1.2 via NGT @ 84mL/hr, increase by 10 every 8 hours to goal rate of 6mL/hr, 150cc free water q6h 2. Monitor Mg, Phos, K, patient at risk for refeeding. MD replete as necessary  NUTRITION DIAGNOSIS:   Inadequate oral intake related to lethargy/confusion as evidenced by other (see comment) (Per observation). -ongoing  GOAL:   Patient will meet greater than or equal to 90% of their needs -not meeting  MONITOR:   I & O's, Labs, Diet advancement, Weight trends  REASON FOR ASSESSMENT:   Low [place]    ASSESSMENT:   Darius [name]  is a 51 y.o. male who presents with Altered mental status. Patient's mother found him slumped over in his chair and unresponsive. Workup here in the states only elevated lactic acid and cocaine positive urine tox screen.  RD consulted to begin tube feeding Per neurology pt somewhat more responsive Labs and medications reviewed.  Diet Order:  Diet NPO time specified Except for: Sips with Meds  Skin:  Reviewed, no issues  Last BM:  PTA  Height:   Ht Readings from Last 1 Encounters:  12/13/16 6' (1.829 m)    Weight:   Wt Readings from Last 1 Encounters:  12/14/16 141 lb 8 oz (64.2 kg)    Ideal Body Weight:  80.9 kg  BMI:  Body mass index is 19.19 kg/m.  Estimated Nutritional Needs:   Kcal:  1950 - 2250 calories  Protein:  83-109 gm  Fluid:  >/= 1.95L  EDUCATION NEEDS:   No education needs identified at this time  Darius [name]. Darius Lequire, MS, RD LDN Inpatient Clinical Dietitian Pager [phone]

## 2016-12-22 NOTE — Consult Note (Signed)
Consultation Note Date: 12/22/2016   Patient Name: Darius [name]  DOB: 27-Jul-1966  MRN: [id number]  Age / Sex: 51 y.o., male  PCP: Pcp Not In System Referring Physician: Demetrios Loll, MD  Reason for Consultation: Establishing goals of care  HPI/Patient Profile: 51 y.o. male  with past medical history of HIV, hepatitis C, seizures, hypothyroidism, CNS lymphoma, CKD III, and drug abuse admitted on 12/13/2016 with unresponsiveness. In ED, elevated lactic acid and cocaine positive UDS. Possible drug induced seizure. Started on antibiotics empirically because of HIV. Neurology, infectious disease, and psych have been consulted. Per ID, no source of infection and antibiotics have been discontinued. Per neuro, EEG showed no evidence of electrographic seizure. Plan for another EEG 4/5. Can not rule out progressive HIV dementia. Per psych, likely neurologic illness. Patient remains encephalopathic and NPO. Palliative medicine consultation for goals of care.   Clinical Assessment and Goals of Care: I have reviewed medical records, discussed with members of the care team, and met with patient, mother, and son at bedside to discuss diagnosis, [place], EOL wishes, disposition and options.  Introduced Palliative Medicine as specialized medical care for people living with serious illness. It focuses on providing relief from the symptoms and stress of a serious illness. The goal is to improve quality of life for both the patient and the family.  Patient lives with mother. He is disabled but able to perform ADL's and was cooking for himself prior to admission. Mother, [name], speaks of multiple complications within the last 20 years with HIV, hep C, and brain tumor/radiation.   Discussed hospital diagnoses and interventions. Plan today is for repeat EEG and NGT placement. We discussed NGT placement being a short term option, and if he does  not improve, they will be faced with the decision to place PEG tube.   No advanced directives or documented POA. Mother is his Media planner. I asked if Elyon has ever spoken of his wishes as his HIV progressed. Gwen speaks of a conversation he had with a friend many years ago regarding if he became dependent on others "just take me into the woods and shoot me." He has never spoken of his wishes with her.   Discussed my recommendation for DNR/DNI with chronic, progressive disease. We discussed risks and benefits of a long term feeding tube--this prolonging life but likely not his quality of life if he does not show improvement in functional and cognitive status. Encouraged she review Hard Choices copy that I provided that talks about these big decisions she is faced with.      Gwen and son, Naida Sleight, are at a loss for words..unsure of what to do and what he would want for himself. He will minimally respond at times. I encouraged she continue to think through these decisions with family. She has my contact information.   Questions and concerns were addressed. Emotional support provided.     SUMMARY OF RECOMMENDATIONS    FULL code. Educated on my recommendation for DNR/DNI with chronic, progressive disease. Hard Choices  copy given.   Plan for EEG and NGT placement today. Watchful waiting. Family hopeful for improvement but seem to understand they are faced with big decisions in the near future (PEG tube vs. Comfort care).  PMT will continue to support patient and family through hospitalization.   Code Status/Advance Care Planning:  Full code   Symptom Management:   Per attending  Palliative Prophylaxis:   Aspiration, Delirium Protocol, Oral Care and Turn Reposition  Additional Recommendations (Limitations, Scope, Preferences):  Full Scope Treatment  Psycho-social/Spiritual:   Desire for further Chaplaincy support:yes  Additional Recommendations: Caregiving  Support/Resources and  Education on Hospice  Prognosis:   Unable to determine  Discharge Planning: To Be Determined      Primary Diagnoses: Present on Admission: . Altered mental state . Drug abuse . HIV (human immunodeficiency virus infection) ([place]) . Hypothyroidism   I have reviewed the medical record, interviewed the patient and family, and examined the patient. The following aspects are pertinent.  Past Medical History:  Diagnosis Date  . CKD (chronic kidney disease), stage III   . CNS lymphoma ([place])   . Drug abuse   . Hepatitis C   . HIV (human immunodeficiency virus infection) ([place])   . Hypothyroidism   . Seizures [facility])    Social History   Social History  . Marital status: Unknown    Spouse name: N/A  . Number of children: N/A  . Years of education: N/A   Social History Main Topics  . Smoking status: Current Every Day Smoker  . Smokeless tobacco: Never Used  . Alcohol use No  . Drug use: Yes  . Sexual activity: Not Asked   Other Topics Concern  . None   Social History Narrative  . None   Family History  Problem Relation Age of Onset  . Family history unknown: Yes   Scheduled Meds: . abacavir-dolutegravir-lamiVUDine  1 tablet Oral Daily  . amLODipine  10 mg Oral Daily  . chlorhexidine  15 mL Mouth Rinse BID  . enoxaparin (LOVENOX) injection  40 mg Subcutaneous Q24H  . FLUoxetine  10 mg Oral Daily  . lacosamide (VIMPAT) IV  100 mg Intravenous Q12H  . levETIRAcetam  1,500 mg Intravenous Q12H  . levothyroxine  25 mcg Intravenous Daily  . lidocaine (PF)  5 mL Other Once  . mouth rinse  15 mL Mouth Rinse q12n4p   Continuous Infusions: . 0.9 % NaCl with KCl 20 mEq / L 50 mL/hr at 12/22/16 0205   PRN Meds:.acetaminophen **OR** acetaminophen, hydrALAZINE, LORazepam, ondansetron **OR** ondansetron (ZOFRAN) IV Medications Prior to Admission:  Prior to Admission medications   Medication Sig Start Date End Date Taking? Authorizing Provider  abacavir-dolutegravir-lamiVUDine  (TRIUMEQ) 600-50-300 MG tablet Take 1 tablet by mouth daily.   Yes Historical Provider, MD  levETIRAcetam (KEPPRA) 500 MG tablet Take 1,000 mg by mouth 2 (two) times daily.   Yes Historical Provider, MD  levothyroxine (SYNTHROID, LEVOTHROID) 50 MCG tablet Take 50 mcg by mouth daily before breakfast.   Yes Historical Provider, MD   Allergies  Allergen Reactions  . Asa [Aspirin] Other (See Comments)    Reaction: unknown  . Sulfa Antibiotics Other (See Comments)    Reaction: unknown   Review of Systems  Unable to perform ROS: Acuity of condition   Physical Exam  Constitutional:  Minimally responsive  HENT:  Head: Normocephalic and atraumatic.  Eyes:  Right gaze  Cardiovascular: Regular rhythm.   Pulmonary/Chest: Effort normal.  Abdominal: Normal appearance.  Neurological:  He is alert.  Skin: Skin is warm and dry.  Psychiatric: Cognition and memory are impaired. He is inattentive.  Nursing note and vitals reviewed.  Vital Signs: BP 132/83 (BP Location: Left Arm)   Pulse 91   Temp 98.6 F (37 C) (Oral)   Resp 16   Ht 6' (1.829 m)   Wt 64.2 kg (141 lb 8 oz)   SpO2 96%   BMI 19.19 kg/m  Pain Assessment: PAINAD POSS *See Group Information*: 2-Acceptable,Slightly drowsy, easily aroused Pain Score: Asleep  SpO2: SpO2: 96 % O2 Device:SpO2: 96 % O2 Flow Rate: .O2 Flow Rate (L/min): 2 L/min  IO: Intake/output summary:   Intake/Output Summary (Last 24 hours) at 12/22/16 1258 Last data filed at 12/21/16 1500  Gross per 24 hour  Intake              150 ml  Output                0 ml  Net              150 ml   LBM: Last BM Date: 12/21/16 Baseline Weight: Weight: 68 kg (150 lb) Most recent weight: Weight: 64.2 kg (141 lb 8 oz)     Palliative Assessment/Data: PPS 10%   Flowsheet Rows     Most Recent Value  Intake Tab  Referral Department  Hospitalist  Unit at Time of Referral  Orthopedic Unit  Palliative Care Primary Diagnosis  Neurology  Date Notified  12/21/16    Palliative Care Type  New Palliative care  Date of Admission  12/13/16  Date first seen by Palliative Care  12/22/16  # of days Palliative referral response time  1 Day(s)  # of days IP prior to Palliative referral  8  Clinical Assessment  Palliative Performance Scale Score  10%  Psychosocial & Spiritual Assessment  Palliative Care Outcomes  Patient/Family meeting held?  Yes  Who was at the meeting?  patient, mother, son  Palliative Care Outcomes  Clarified goals of care, ACP counseling assistance, Provided psychosocial or spiritual support      Time In: 1200 Time Out: 1310 Time Total: 19mn Greater than 50%  of this time was spent counseling and coordinating care related to the above assessment and plan.  Signed by:  MIhor Dow FNP-C Palliative Medicine Team  Phone: [phone]: [phone]  Please contact Palliative Medicine Team phone at [phone] questions and concerns.  For individual provider: See AShea Evans

## 2016-12-22 NOTE — Progress Notes (Addendum)
Subjective: Patient in bed with eyes open.  Moving RUE spontaneously.  Objective: Current vital signs: BP 132/83 (BP Location: Left Arm)   Pulse 91   Temp 98.6 F (37 C) (Oral)   Resp 16   Ht 6' (1.829 m)   Wt 64.2 kg (141 lb 8 oz)   SpO2 96%   BMI 19.19 kg/m  Vital signs in last 24 hours: Temp:  [98.6 F (37 C)-99 F (37.2 C)] 98.6 F (37 C) (04/05 0755) Pulse Rate:  [90-99] 91 (04/05 0831) Resp:  [12-16] 16 (04/05 0831) BP: (132-180)/(83-111) 132/83 (04/05 0831) SpO2:  [96 %-100 %] 96 % (04/05 0831)  Intake/Output from previous day: 04/04 0701 - 04/05 0700 In: 766.7 [I.V.:536.7; IV Piggyback:230] Out: -  Intake/Output this shift: No intake/output data recorded. Nutritional status: Diet NPO time specified Except for: Sips with Meds  Neurologic Exam: Mental Status: Eyes open. No verbal response noted. Nodded head once in response to questioning.  Does not follow commands. Questionable leftneglect Cranial Nerves: II: Pupils equal, round, reactive to light and accommodation III,IV, VI: ptosis not present, right gaze preference with patient coming to medline at times but not crossing midline.   V,VII: Facesymmetric VIII: hearing normal bilaterally IX,X: unable to test XI: unable to test XII: unable to test Motor: Localizes to pain with RUE and covers mouth when he coughs.  No movement noted in the LUE Sensory: Responds to noxious stimuli in all extremities  Lab Results: Basic Metabolic Panel:  Recent Labs Lab 12/16/16 0600 12/18/16 0406 12/21/16 0447  NA  --  142 138  K  --  3.9 3.8  CL  --  112* 108  CO2  --  20* 21*  GLUCOSE  --  80 81  BUN  --  15 13  CREATININE 1.28* 1.17 0.95  CALCIUM  --  8.7* 8.9    Liver Function Tests:  Recent Labs Lab 12/21/16 0447  AST 57*  ALT 52  ALKPHOS 57  BILITOT 0.7  PROT 8.4*  ALBUMIN 3.7   No results for input(s): LIPASE, AMYLASE in the last 168 hours. No results for input(s): AMMONIA in the last 168  hours.  CBC:  Recent Labs Lab 12/18/16 0406 12/21/16 0447  WBC 7.5  7.7 7.3  NEUTROABS 4.7  --   HGB 12.6* 12.9*  HCT 37.7*  36.3* 39.0*  MCV 99.8  98* 99.7  PLT 150  180 189    Cardiac Enzymes: No results for input(s): CKTOTAL, CKMB, CKMBINDEX, TROPONINI in the last 168 hours.  Lipid Panel: No results for input(s): CHOL, TRIG, HDL, CHOLHDL, VLDL, LDLCALC in the last 168 hours.  CBG: No results for input(s): GLUCAP in the last 168 hours.  Microbiology: Results for orders placed or performed during the hospital encounter of 12/13/16  Blood Culture (routine x 2)     Status: None   Collection Time: 12/13/16  8:13 PM  Result Value Ref Range Status   Specimen Description BLOOD R ARM  Final   Special Requests BOTTLES DRAWN AEROBIC AND ANAEROBIC [place]  Final   Culture NO GROWTH 5 DAYS  Final   Report Status 12/18/2016 FINAL  Final  Urine culture     Status: Abnormal   Collection Time: 12/13/16  8:13 PM  Result Value Ref Range Status   Specimen Description URINE, RANDOM  Final   Special Requests NONE  Final   Culture (A)  Final    >=100,000 COLONIES/mL METHICILLIN RESISTANT STAPHYLOCOCCUS AUREUS   Report Status  12/16/2016 FINAL  Final   Organism ID, Bacteria METHICILLIN RESISTANT STAPHYLOCOCCUS AUREUS (A)  Final      Susceptibility   Methicillin resistant staphylococcus aureus - MIC*    CIPROFLOXACIN >=8 RESISTANT Resistant     GENTAMICIN >=16 RESISTANT Resistant     NITROFURANTOIN <=16 SENSITIVE Sensitive     OXACILLIN >=4 RESISTANT Resistant     TETRACYCLINE <=1 SENSITIVE Sensitive     VANCOMYCIN 1 SENSITIVE Sensitive     TRIMETH/SULFA <=10 SENSITIVE Sensitive     CLINDAMYCIN >=8 RESISTANT Resistant     RIFAMPIN <=0.5 SENSITIVE Sensitive     Inducible Clindamycin NEGATIVE Sensitive     * >=100,000 COLONIES/mL METHICILLIN RESISTANT STAPHYLOCOCCUS AUREUS  Blood Culture (routine x 2)     Status: None   Collection Time: 12/13/16 10:24 PM  Result Value Ref Range  Status   Specimen Description BLOOD  R ARM  Final   Special Requests BOTTLES DRAWN AEROBIC AND ANAEROBIC  BCAV  Final   Culture NO GROWTH 5 DAYS  Final   Report Status 12/18/2016 FINAL  Final  CSF culture     Status: None   Collection Time: 12/15/16 11:30 AM  Result Value Ref Range Status   Specimen Description CSF  Final   Special Requests NONE  Final   Gram Stain   Final    MODERATE RED BLOOD CELLS PRESENT RARE WBC SEEN NO ORGANISMS SEEN    Culture   Final    NO GROWTH 3 DAYS Performed at [facility], [address], [place], [place] [address]    Report Status 12/19/2016 FINAL  Final  Culture, fungus without smear     Status: None (Preliminary result)   Collection Time: 12/15/16 11:30 AM  Result Value Ref Range Status   Specimen Description CSF  Final   Special Requests NONE  Final   Culture   Final    NO FUNGUS ISOLATED AFTER 3 DAYS Performed at [facility], [place] [address], [place], [place] [address]    Report Status PENDING  Incomplete    Coagulation Studies: No results for input(s): LABPROT, INR in the last 72 hours.  Imaging: No results found.  Medications:  I have reviewed the patient's current medications. Scheduled: . abacavir-dolutegravir-lamiVUDine  1 tablet Oral Daily  . amLODipine  10 mg Oral Daily  . chlorhexidine  15 mL Mouth Rinse BID  . enoxaparin (LOVENOX) injection  40 mg Subcutaneous Q24H  . FLUoxetine  10 mg Oral Daily  . lacosamide (VIMPAT) IV  100 mg Intravenous Q12H  . levETIRAcetam  1,500 mg Intravenous Q12H  . levothyroxine  25 mcg Intravenous Daily  . lidocaine (PF)  5 mL Other Once  . mouth rinse  15 mL Mouth Rinse q12n4p    Assessment/Plan: Patient appears somewhat more responsive today.  No further seizures noted.  On Vimpat and Keppra.  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    Recommendations: 1.  Repeat EEG  2.  Continue Keppra and Vimpat at current doses 3.  Continue seizure precautions  Case discussed at length with  family and attending physician.    LOS: 2 days   Alexis Goodell, MD Neurology [phone] 12/22/2016  12:57 PM

## 2016-12-22 NOTE — Progress Notes (Signed)
[place] at [facility]                                                                                                                                                                                  Patient Demographics   Darius [name], is a 51 y.o. male, DOB - 08-06-1966, SJG:[id number]  Admit date - 12/13/2016   Admitting Physician Lance Coon, MD  Outpatient Primary MD for the patient is Pcp Not In System   LOS - 2  Subjective: Patient admitted with acute encephalopathy. He is awake but still very confused. And not able to communicate much. His mother is at bedside. His mother reports that patient is disabled and lives at home alone but normally he is awake alert and communicative. He does have HIV and she states that last was seen and [facility]. His viral load was undetectable. According to her he is been taking his meds.  The patient calm, limited response to verbal stimuli. Eyes pened, move right arm.  Review of Systems:   CONSTITUTIONAL: Unable to provide  Vitals:   Vitals:   12/21/16 2326 12/22/16 0755 12/22/16 0756 12/22/16 0831  BP: (!) 162/89 (!) 180/111 (!) 175/107 132/83  Pulse: 99 91 90 91  Resp: 16 12  16   Temp: 99 F (37.2 C) 98.6 F (37 C)    TempSrc: Oral Oral    SpO2: 100% 100%  96%  Weight:      Height:        Wt Readings from Last 3 Encounters:  12/14/16 141 lb 8 oz (64.2 kg)  04/30/16 145 lb (65.8 kg)    No intake or output data in the 24 hours ending 12/22/16 1520  Physical Exam:   GENERAL: Pleasant-appearing in no apparent distress. He is confused HEAD, EYES, EARS, NOSE AND THROAT: Atraumatic, normocephalic. . Pupils equal and reactive to light. Sclerae anicteric. No conjunctival injection. Oral thrush. NECK: Supple. There is no jugular venous distention. No bruits, no lymphadenopathy, no thyromegaly.  HEART: Regular rate and rhythm,. No murmurs, no rubs, no clicks.  LUNGS: Clear to  auscultation bilaterally. No rales or rhonchi. No wheezes.  ABDOMEN: Soft, flat, nontender, nondistended. Has good bowel sounds. No hepatosplenomegaly appreciated.  EXTREMITIES: No evidence of any cyanosis, clubbing, or peripheral edema.  +2 pedal and radial pulses bilaterally.  NEUROLOGIC: The patient calm, limited response to verbal stimuli. Eyes pened, move right arm. SKIN: Moist and warm with no rashes appreciated.  Psych: Not anxious, depressed LN: No inguinal LN enlargement    Antibiotics   Anti-infectives    Start     Dose/Rate Route Frequency Ordered Stop   12/18/16  1800  vancomycin (VANCOCIN) 750 mg in dextrose 5 % 150 mL IVPB  Status:  Discontinued     750 mg 150 mL/hr over 60 Minutes Intravenous Every 12 hours 12/18/16 0902 12/19/16 1535   12/17/16 1800  vancomycin (VANCOCIN) IVPB 750 mg/150 ml premix  Status:  Discontinued     750 mg 150 mL/hr over 60 Minutes Intravenous Every 12 hours 12/17/16 1122 12/18/16 0853   12/16/16 1200  fluconazole (DIFLUCAN) IVPB 100 mg     100 mg 50 mL/hr over 60 Minutes Intravenous Every 24 hours 12/16/16 1140 12/18/16 1254   12/15/16 1630  vancomycin (VANCOCIN) IVPB 1000 mg/200 mL premix  Status:  Discontinued     1,000 mg 200 mL/hr over 60 Minutes Intravenous Every 12 hours 12/15/16 0522 12/15/16 0920   12/15/16 1630  vancomycin (VANCOCIN) IVPB 750 mg/150 ml premix  Status:  Discontinued     750 mg 150 mL/hr over 60 Minutes Intravenous Every 12 hours 12/15/16 0920 12/15/16 0930   12/15/16 1630  vancomycin (VANCOCIN) 750 mg in sodium chloride 0.9 % 150 mL IVPB  Status:  Discontinued     750 mg 150 mL/hr over 60 Minutes Intravenous Every 12 hours 12/15/16 0930 12/17/16 1122   12/15/16 0530  vancomycin (VANCOCIN) 250 mg in sodium chloride 0.9 % 100 mL IVPB  Status:  Discontinued     250 mg 100 mL/hr over 60 Minutes Intravenous  Once 12/15/16 0523 12/15/16 0525   12/14/16 1430  cefTRIAXone (ROCEPHIN) 2 g in dextrose 5 % 50 mL IVPB  Status:   Discontinued     2 g 100 mL/hr over 30 Minutes Intravenous Every 12 hours 12/14/16 1426 12/19/16 1535   12/14/16 1000  abacavir-dolutegravir-lamiVUDine (TRIUMEQ) 600-50-300 MG per tablet 1 tablet     1 tablet Oral Daily 12/14/16 0155     12/14/16 0600  piperacillin-tazobactam (ZOSYN) IVPB 3.375 g  Status:  Discontinued     3.375 g 12.5 mL/hr over 240 Minutes Intravenous Every 8 hours 12/13/16 2232 12/14/16 1426   12/14/16 0430  vancomycin (VANCOCIN) IVPB 750 mg/150 ml premix  Status:  Discontinued     750 mg 150 mL/hr over 60 Minutes Intravenous Every 12 hours 12/13/16 2232 12/15/16 0522   12/13/16 2215  piperacillin-tazobactam (ZOSYN) IVPB 3.375 g     3.375 g 100 mL/hr over 30 Minutes Intravenous  Once 12/13/16 2212 12/13/16 2310   12/13/16 2215  vancomycin (VANCOCIN) IVPB 1000 mg/200 mL premix     1,000 mg 200 mL/hr over 60 Minutes Intravenous  Once 12/13/16 2212 12/13/16 2325      Medications   Scheduled Meds: . abacavir-dolutegravir-lamiVUDine  1 tablet Oral Daily  . amLODipine  10 mg Oral Daily  . chlorhexidine  15 mL Mouth Rinse BID  . enoxaparin (LOVENOX) injection  40 mg Subcutaneous Q24H  . FLUoxetine  10 mg Oral Daily  . free water  150 mL Per Tube Q6H  . lacosamide (VIMPAT) IV  100 mg Intravenous Q12H  . levETIRAcetam  1,500 mg Intravenous Q12H  . levothyroxine  25 mcg Intravenous Daily  . lidocaine (PF)  5 mL Other Once  . mouth rinse  15 mL Mouth Rinse q12n4p   Continuous Infusions: . 0.9 % NaCl with KCl 20 mEq / L 50 mL/hr at 12/22/16 0205  . feeding supplement (JEVITY 1.2 CAL)     PRN Meds:.acetaminophen **OR** acetaminophen, hydrALAZINE, LORazepam, ondansetron **OR** ondansetron (ZOFRAN) IV   Data Review:   Micro Results Recent Results (from the  past 240 hour(s))  Blood Culture (routine x 2)     Status: None   Collection Time: 12/13/16  8:13 PM  Result Value Ref Range Status   Specimen Description BLOOD R ARM  Final   Special Requests BOTTLES DRAWN  AEROBIC AND ANAEROBIC [place]  Final   Culture NO GROWTH 5 DAYS  Final   Report Status 12/18/2016 FINAL  Final  Urine culture     Status: Abnormal   Collection Time: 12/13/16  8:13 PM  Result Value Ref Range Status   Specimen Description URINE, RANDOM  Final   Special Requests NONE  Final   Culture (A)  Final    >=100,000 COLONIES/mL METHICILLIN RESISTANT STAPHYLOCOCCUS AUREUS   Report Status 12/16/2016 FINAL  Final   Organism ID, Bacteria METHICILLIN RESISTANT STAPHYLOCOCCUS AUREUS (A)  Final      Susceptibility   Methicillin resistant staphylococcus aureus - MIC*    CIPROFLOXACIN >=8 RESISTANT Resistant     GENTAMICIN >=16 RESISTANT Resistant     NITROFURANTOIN <=16 SENSITIVE Sensitive     OXACILLIN >=4 RESISTANT Resistant     TETRACYCLINE <=1 SENSITIVE Sensitive     VANCOMYCIN 1 SENSITIVE Sensitive     TRIMETH/SULFA <=10 SENSITIVE Sensitive     CLINDAMYCIN >=8 RESISTANT Resistant     RIFAMPIN <=0.5 SENSITIVE Sensitive     Inducible Clindamycin NEGATIVE Sensitive     * >=100,000 COLONIES/mL METHICILLIN RESISTANT STAPHYLOCOCCUS AUREUS  Blood Culture (routine x 2)     Status: None   Collection Time: 12/13/16 10:24 PM  Result Value Ref Range Status   Specimen Description BLOOD  R ARM  Final   Special Requests BOTTLES DRAWN AEROBIC AND ANAEROBIC  BCAV  Final   Culture NO GROWTH 5 DAYS  Final   Report Status 12/18/2016 FINAL  Final  CSF culture     Status: None   Collection Time: 12/15/16 11:30 AM  Result Value Ref Range Status   Specimen Description CSF  Final   Special Requests NONE  Final   Gram Stain   Final    MODERATE RED BLOOD CELLS PRESENT RARE WBC SEEN NO ORGANISMS SEEN    Culture   Final    NO GROWTH 3 DAYS Performed at [facility], [address] [place], [place] [address]    Report Status 12/19/2016 FINAL  Final  Culture, fungus without smear     Status: None (Preliminary result)   Collection Time: 12/15/16 11:30 AM  Result Value Ref Range Status    Specimen Description CSF  Final   Special Requests NONE  Final   Culture   Final    NO FUNGUS ISOLATED AFTER 3 DAYS Performed at [facility], [place] [address] [place], [place] [address]    Report Status PENDING  Incomplete    Radiology Reports Ct Head Wo Contrast  Result Date: 12/13/2016 CLINICAL DATA:  Acute onset of generalized weakness. Patient not speaking. Initial encounter. EXAM: CT HEAD WITHOUT CONTRAST TECHNIQUE: Contiguous axial images were obtained from the base of the skull through the vertex without intravenous contrast. COMPARISON:  CT of the head performed 04/29/2016 FINDINGS: Brain: No evidence of acute infarction, hemorrhage, hydrocephalus, extra-axial collection or mass lesion/mass effect. Prominence of the ventricles and sulci reflects moderate cortical volume loss. Mild cerebellar atrophy is noted. Scattered periventricular and subcortical white matter change likely reflects small vessel ischemic microangiopathy. Chronic encephalomalacia is noted at the left basal ganglia, and there is ex vacuo dilatation of the left lateral  ventricle. Calcification is seen at the pons. No mass effect or midline shift is seen. Chronic subdural thickening is noted on the left side. Vascular: No hyperdense vessel or unexpected calcification. Skull: There is no evidence of fracture; the patient is status post left frontoparietal craniotomy. Sinuses/Orbits: The visualized portions of the orbits are within normal limits. The paranasal sinuses and mastoid air cells are well-aerated. Other: No significant soft tissue abnormalities are seen. IMPRESSION: 1. No acute intracranial pathology seen on CT. 2. Moderate cortical volume loss and scattered small vessel ischemic microangiopathy. 3. Chronic encephalomalacia at the left basal ganglia, with ex vacuo dilatation of the left lateral ventricle. Chronic subdural thickening on the left side. Electronically Signed   By: Garald Balding M.D.   On: 12/13/2016  23:02   Dg Chest Port 1 View  Result Date: 12/13/2016 CLINICAL DATA:  Sepsis EXAM: PORTABLE CHEST 1 VIEW COMPARISON:  04/29/2014 FINDINGS: Cardiomediastinal silhouette is stable. No infiltrate or pleural effusion. No pulmonary edema. Bony thorax is unremarkable. IMPRESSION: No active disease. Electronically Signed   By: Lahoma Crocker M.D.   On: 12/13/2016 21:41   Dg Fluoro Guided Loc Of Needle/cath Tip For Spinal Inject Lt  Result Date: 12/15/2016 CLINICAL DATA:  Abnormal mental status. EXAM: DIAGNOSTIC LUMBAR PUNCTURE UNDER FLUOROSCOPIC GUIDANCE FLUOROSCOPY TIME:  Fluoroscopy Time:  0 minutes 20 seconds Radiation Exposure Index (if provided by the fluoroscopic device): 5.4 mGy Number of Acquired Spot Images: 1 PROCEDURE: Patient unable to give consent. After discussing the risks and benefits of this procedure with the patient's mother informed consent was obtained. Back was sterilely prepped and draped. Local anesthesia administered 1% lidocaine. 22 gauge needle was advanced into the lumbar canal and clear 9 cc of clear CSF removed and sent for the requested labs. Opening pressure was 9 cm of water. Needle was removed. Hemostasis achieved. No complications. IMPRESSION: Successful fluoroscopically directed lumbar puncture. Electronically Signed   By: Marcello Moores  Register   On: 12/15/2016 12:40     CBC  Recent Labs Lab 12/18/16 0406 12/21/16 0447  WBC 7.5  7.7 7.3  HGB 12.6* 12.9*  HCT 37.7*  36.3* 39.0*  PLT 150  180 189  MCV 99.8  98* 99.7  MCH 32.8  33.4 33.1  MCHC 33.3  33.5 33.2  RDW 14.1  14.0 14.2  LYMPHSABS 1.8  --   EOSABS 0.2  --   BASOSABS 0.0  --     Chemistries   Recent Labs Lab 12/16/16 0600 12/18/16 0406 12/21/16 0447  NA  --  142 138  K  --  3.9 3.8  CL  --  112* 108  CO2  --  20* 21*  GLUCOSE  --  80 81  BUN  --  15 13  CREATININE 1.28* 1.17 0.95  CALCIUM  --  8.7* 8.9  AST  --   --  57*  ALT  --   --  52  ALKPHOS  --   --  57  BILITOT  --   --  0.7    ------------------------------------------------------------------------------------------------------------------ estimated creatinine clearance is 86.4 mL/min (by C-G formula based on SCr of 0.95 mg/dL). ------------------------------------------------------------------------------------------------------------------ No results for input(s): HGBA1C in the last 72 hours. ------------------------------------------------------------------------------------------------------------------ No results for input(s): CHOL, HDL, LDLCALC, TRIG, CHOLHDL, LDLDIRECT in the last 72 hours. ------------------------------------------------------------------------------------------------------------------ No results for input(s): TSH, T4TOTAL, T3FREE, THYROIDAB in the last 72 hours.  Invalid input(s): FREET3 ------------------------------------------------------------------------------------------------------------------ No results for input(s): VITAMINB12, FOLATE, FERRITIN, TIBC, IRON, RETICCTPCT in the last 72 hours.  Coagulation profile No results for input(s): INR, PROTIME in the last 168 hours.  No results for input(s): DDIMER in the last 72 hours.  Cardiac Enzymes No results for input(s): CKMB, TROPONINI, MYOGLOBIN in the last 168 hours.  Invalid input(s): CK ------------------------------------------------------------------------------------------------------------------ Invalid input(s): Outlook  Patient is a 51 year old with HIV with acute mental status changes  1. Acute encephalopathy: Ammonia is slightly abnormal not the cause for his encephalopathy.  Per Dr. Ola Spurr, Check serum crypto, RPR, TSH: Unremarkable.  LP done,  routine labs plus Crypto ag and CSF VDRL: negative. Follow-up culture. Blood cultures negative. Urinalysis is normal but the urine culture show MRSA. cont vanco, changed zosyn to ceftraixone 2 gm q 12. Per Dr. Ola Spurr DC all abx - no  evidence meningitis or infection. MRSA in Urine is likley contaminant given neg UA.  Per Dr. Doy Mince, [place].  EEG shows no evidence of electrographic seizure.  Initially not suggestive of infection.  Cryptococcal antigen negative. 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 Per Psych consult, Most likely all of the mental status changes are not related to "psychiatric" but rather to "neurologic" illness.   Per Dr. Macie Burows, Would consider doing another EEG and decreasing Keppra more but do not want to do it when EEG not available.  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 Prozac started. Started on Provigil 100 daily which can lower seizure threshold, discontinued.  2. Seizure activity. No seizure activity since yesterday.  neuro check every 2 hours, Ativan IV when necessary, Started Vimpat.  200mg  IV load now with 100mg  IV maintenance q12 hours.  Continue Keppra at 1500mg  q 12 hours, repeat EEG today per neurologist Dr. Doy Mince.  3. Hypothyroidism continue Synthroid, changed to IV.  4. HIV continue HIV meds. 5.DVT prophylaxis, resumed Lovenox.  CKD stage II. Stable.  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  Oral thrush. Was on iv Diflucan. Accelerated hypertension. Unable to take po Norvasc, on IV hydralazine when necessary. BP controlled.  I discussed with  Dr. Doy Mince and palliative care NP Lilia Pro.  I discussed the patient's condition and poor prognosis with the patient's mother and son. I discussed with her about palliative care and NGT, PEG tube placement. She agreed to try NGT first.    Code Status Orders        Start     Ordered   12/14/16 0156  Full code  Continuous     12/14/16 0155    Code Status History    Date Active Date Inactive Code Status Order ID Comments User Context   04/30/2016 12:53 AM 04/30/2016  5:45 PM Full Code [id number]  Lance Coon, MD Inpatient      Consults  Id ,neuro DVT Prophylaxis  scd's   Lab Results   Component Value Date   PLT 189 12/21/2016     Time Spent in minutes   46 min  Greater than 50% of time spent in care coordination and counseling patient regarding the condition and plan of care.   Demetrios Loll M.D on 12/22/2016 at 3:20 PM  Between 7am to 6pm - Pager - [phone]  After 6pm go to [email or web address] - password [email or web address] [place] [facility]   Office  [phone]

## 2016-12-23 ENCOUNTER — Inpatient Hospital Stay
Admission: AD | Admit: 2016-12-23 | Payer: Self-pay | Source: Other Acute Inpatient Hospital | Admitting: Internal Medicine

## 2016-12-23 ENCOUNTER — Inpatient Hospital Stay: Payer: Medicare Other

## 2016-12-23 LAB — GLUCOSE, CAPILLARY
GLUCOSE-CAPILLARY: 85 mg/dL (ref 65–99)
GLUCOSE-CAPILLARY: 97 mg/dL (ref 65–99)
Glucose-Capillary: 88 mg/dL (ref 65–99)
Glucose-Capillary: 104 mg/dL — ABNORMAL HIGH (ref 65–99)

## 2016-12-23 LAB — PHOSPHORUS: Phosphorus: 3.5 mg/dL (ref 2.5–4.6)

## 2016-12-23 LAB — MAGNESIUM: Magnesium: 1.9 mg/dL (ref 1.7–2.4)

## 2016-12-23 MED ORDER — SODIUM CHLORIDE 0.9 % IV SOLN: 200.0000 mg | Freq: Two times a day (BID) | INTRAVENOUS | Status: DC

## 2016-12-23 MED ORDER — FREE WATER: 150.0000 mL | Freq: Four times a day (QID) | Status: DC

## 2016-12-23 MED ORDER — AMLODIPINE BESYLATE 10 MG PO TABS: 10.0000 mg | ORAL_TABLET | Freq: Every day | ORAL | Status: DC

## 2016-12-23 MED ORDER — SODIUM CHLORIDE 0.9 % IV SOLN
200.0000 mg | Freq: Two times a day (BID) | INTRAVENOUS | Status: DC
  Administered 2016-12-24 – 2016-12-28 (×10): 200 mg via INTRAVENOUS
  Filled 2016-12-23 (×20): qty 20

## 2016-12-23 MED ORDER — LORAZEPAM 2 MG/ML IJ SOLN: 2.0000 mg | INTRAMUSCULAR | 0 refills | Status: DC | PRN

## 2016-12-23 MED ORDER — FLUOXETINE HCL 20 MG/5ML PO SOLN: 10.0000 mg | Freq: Every day | ORAL | 3 refills | Status: AC

## 2016-12-23 MED ORDER — SODIUM CHLORIDE 0.9 % IV SOLN
50.0000 mg | Freq: Once | INTRAVENOUS | Status: AC
  Administered 2016-12-23: 50 mg via INTRAVENOUS
  Filled 2016-12-23: qty 5

## 2016-12-23 MED ORDER — CHLORHEXIDINE GLUCONATE 0.12 % MT SOLN: 15.0000 mL | Freq: Two times a day (BID) | OROMUCOSAL | 0 refills | Status: DC

## 2016-12-23 MED ORDER — LEVOTHYROXINE SODIUM 100 MCG IV SOLR
25.0000 ug | Freq: Every day | INTRAVENOUS | Status: DC
  Administered 2016-12-24 – 2016-12-28 (×5): 25 ug via INTRAVENOUS
  Filled 2016-12-23 (×5): qty 5

## 2016-12-23 MED ORDER — SODIUM CHLORIDE 0.9 % IV SOLN
150.0000 mg | Freq: Two times a day (BID) | INTRAVENOUS | Status: DC
  Administered 2016-12-23: 150 mg via INTRAVENOUS
  Filled 2016-12-23 (×3): qty 15

## 2016-12-23 MED ORDER — LEVOTHYROXINE SODIUM 100 MCG IV SOLR: 25.0000 ug | Freq: Every day | INTRAVENOUS | Status: DC

## 2016-12-23 MED ORDER — JEVITY 1.2 CAL PO LIQD: 1000.0000 mL | ORAL | 0 refills | Status: DC

## 2016-12-23 MED ORDER — SODIUM CHLORIDE 0.9 % IV SOLN: 1500.0000 mg | Freq: Two times a day (BID) | INTRAVENOUS | Status: DC

## 2016-12-23 MED ORDER — HYDRALAZINE HCL 20 MG/ML IJ SOLN: 10.0000 mg | Freq: Four times a day (QID) | INTRAMUSCULAR | Status: AC | PRN

## 2016-12-23 NOTE — Progress Notes (Signed)
Patient mother at bedside. Patient mother informed that patient is pending transfer to [facility]. [facility]. Patient mother has stated that she would prefer patient be transferred to [facility] based on geographical proximity of mother and other family members to [place]. Spoke with Dr. Vianne Bulls regarding mother's concerns. Dr. Vianne Bulls said that she would speak to Dr. Bridgett Larsson.

## 2016-12-23 NOTE — Discharge Summary (Signed)
[facility] [place] NAME: Darius [name]    MR#:  [id number]  DATE OF BIRTH:  1966-08-30  DATE OF ADMISSION:  12/13/2016   ADMITTING PHYSICIAN: Darius Coon, MD  DATE OF DISCHARGE: No discharge date for patient encounter.  PRIMARY CARE PHYSICIAN: Pcp Not In System   ADMISSION DIAGNOSIS:  alteredmental status DISCHARGE DIAGNOSIS:  Principal Problem:   Altered mental state Active Problems:   HIV (human immunodeficiency virus infection) ([place])   Drug abuse   Hypothyroidism   Seizures ([place])   Confusion   Palliative care by specialist   Goals of care, counseling/discussion cute encephalopathy progressive HIV dementia. seizure SECONDARY DIAGNOSIS:   Past Medical History:  Diagnosis Date  . CKD (chronic kidney disease), stage III   . CNS lymphoma ([place])   . Drug abuse   . Hepatitis C   . HIV (human immunodeficiency virus infection) ([place])   . Hypothyroidism   . Seizures [facility])    HOSPITAL COURSE:  Patient is a 51 year old with HIV with acute mental status changes  1. Acute encephalopathy: Ammonia is slightly abnormal not the cause for his encephalopathy.  Per Dr. Ola Spurr, Check serum crypto, RPR, TSH: Unremarkable.  LP done,  routine labs plus Crypto ag and CSF VDRL: negative. Follow-up culture. Blood cultures negative. Urinalysis is normal but the urine culture show MRSA. cont vanco, changed zosyn to ceftraixone 2 gm q 12. Per Dr. Ola Spurr DC all abx - no evidence meningitis or infection. MRSA in Urine is likley contaminant given neg UA.  Per Dr. Doy Mince, On [place]. EEG shows no evidence of electrographic seizure.  Initially not suggestive of infection. Cryptococcal antigen negative. 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 Per Psych consult, Most likely all of the mental status changes are not related to "psychiatric" but rather to "neurologic" illness.   Per Dr. Macie Burows, Would consider doing another EEG  and decreasing Keppra [name] but do not want to do it when EEG not available.  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 Prozac started. Started on Provigil 100 daily which can lower seizure threshold,discontinued.  2. Seizure activity. Seizure activity today, given ativan. On neuro check every 2 hours, Ativan IV when necessary, Started Vimpat. 200mg  IV load with 100mg  IV maintenance q12 hours. Continue Keppra at 1500mg  q 12 hours,  Continue Vimpat and Keppra at the current doses, the patient needs to be transferred to [facility] hospital for continuous EEG monitor per neurologist Dr. Doy Mince. I called [facility] for possible transfer. I discussed the on-call hospitalist, who accepted the patient, but no bed is available. The patient is on waiting list.  3. Hypothyroidism continue Synthroid.  4. HIV continue HIV meds. 5.DVT prophylaxis, resumed Lovenox.  CKD stage II. Stable.  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  Oral thrush. Was on iv Diflucan. Accelerated hypertension. continue Norvasc, on IV hydralazine when necessary. BP better controlled.  I discussed with  Dr. Doy Mince. Bed is available. The patient will be transferred to [place]:  Guarded, The patient will be transferred to [facility] hospital CONSULTS OBTAINED:  Treatment Team:  Leonel Ramsay, MD Catarina Hartshorn, MD Gonzella Lex, MD DRUG ALLERGIES:   Allergies  Allergen Reactions  . Asa [Aspirin] Other (See Comments)    Reaction: unknown  . Sulfa Antibiotics Other (See Comments)    Reaction: unknown   DISCHARGE MEDICATIONS:   Allergies as of 12/23/2016  Reactions   Asa [aspirin] Other (See Comments)   Reaction: unknown   Sulfa Antibiotics Other (See Comments)   Reaction: unknown      Medication List    STOP taking these medications   levETIRAcetam 500 MG tablet Commonly known as:  KEPPRA   levothyroxine 50 MCG  tablet Commonly known as:  SYNTHROID, LEVOTHROID Replaced by:  levothyroxine 100 MCG Solr injection     TAKE these medications   amLODipine 10 MG tablet Commonly known as:  NORVASC 1 tablet (10 mg total) by Per NG tube route daily. Start taking on:  12/24/2016   chlorhexidine 0.12 % solution Commonly known as:  PERIDEX 15 mLs by Mouth Rinse route 2 (two) times daily.   feeding supplement (JEVITY 1.2 CAL) Liqd Place 1,000 mLs into feeding tube continuous.   FLUoxetine 20 MG/5ML solution Commonly known as:  PROZAC Place 2.5 mLs (10 mg total) into feeding tube daily. Start taking on:  12/24/2016   free water Soln Place 150 mLs into feeding tube every 6 (six) hours.   hydrALAZINE 20 MG/ML injection Commonly known as:  APRESOLINE Inject 0.5 mLs (10 mg total) into the vein every 6 (six) hours as needed (Systolic >409/[id number] >956).   lacosamide 200 mg in sodium chloride 0.9 % 25 mL Inject 200 mg into the vein every 12 (twelve) hours. Start taking on:  12/24/2016   levETIRAcetam 1,500 mg in sodium chloride 0.9 % 100 mL Inject 1,500 mg into the vein every 12 (twelve) hours.   levothyroxine 100 MCG Solr injection Commonly known as:  SYNTHROID, LEVOTHROID Inject 1.25 mLs (25 mcg total) into the vein daily. Start taking on:  12/24/2016 Replaces:  levothyroxine 50 MCG tablet   LORazepam 2 MG/ML injection Commonly known as:  ATIVAN Inject 1 mL (2 mg total) into the vein every 2 (two) hours as needed for anxiety, seizure or sedation.   TRIUMEQ 600-50-300 MG tablet Generic drug:  abacavir-dolutegravir-lamiVUDine Take 1 tablet by mouth daily.        DISCHARGE INSTRUCTIONS:  See AVS.  If you experience worsening of your admission symptoms, develop shortness of breath, life threatening emergency, suicidal or homicidal thoughts you must seek medical attention immediately by calling 911 or calling your MD immediately  if symptoms less severe.  You Must read complete  instructions/literature along with all the possible adverse reactions/side effects for all the Medicines you take and that have been prescribed to you. Take any new Medicines after you have completely understood and accpet all the possible adverse reactions/side effects.   Please note  You were cared for by a hospitalist during your hospital stay. If you have any questions about your discharge medications or the care you received while you were in the hospital after you are discharged, you can call the unit and asked to speak with the hospitalist on call if the hospitalist that took care of you is not available. Once you are discharged, your primary care physician will handle any further medical issues. Please note that NO REFILLS for any discharge medications will be authorized once you are discharged, as it is imperative that you return to your primary care physician (or establish a relationship with a primary care physician if you do not have one) for your aftercare needs so that they can reassess your need for medications and monitor your lab values.    On the day of Discharge:  VITAL SIGNS:  Blood pressure 137/89, pulse 94, temperature 97.7 F (36.5 C), temperature source Oral,  resp. rate 19, height 6' (1.829 m), weight 138 lb (62.6 kg), SpO2 100 %. PHYSICAL EXAMINATION:  See progress note. DATA REVIEW:   CBC  Recent Labs Lab 12/21/16 0447  WBC 7.3  HGB 12.9*  HCT 39.0*  PLT 189    Chemistries   Recent Labs Lab 12/21/16 0447 12/23/16 0651  NA 138  --   K 3.8  --   CL 108  --   CO2 21*  --   GLUCOSE 81  --   BUN 13  --   CREATININE 0.95  --   CALCIUM 8.9  --   MG  --  1.9  AST 57*  --   ALT 52  --   ALKPHOS 57  --   BILITOT 0.7  --      Microbiology Results  Results for orders placed or performed during the hospital encounter of 12/13/16  Blood Culture (routine x 2)     Status: None   Collection Time: 12/13/16  8:13 PM  Result Value Ref Range Status    Specimen Description BLOOD R ARM  Final   Special Requests BOTTLES DRAWN AEROBIC AND ANAEROBIC [place]  Final   Culture NO GROWTH 5 DAYS  Final   Report Status 12/18/2016 FINAL  Final  Urine culture     Status: Abnormal   Collection Time: 12/13/16  8:13 PM  Result Value Ref Range Status   Specimen Description URINE, RANDOM  Final   Special Requests NONE  Final   Culture (A)  Final    >=100,000 COLONIES/mL METHICILLIN RESISTANT STAPHYLOCOCCUS AUREUS   Report Status 12/16/2016 FINAL  Final   Organism ID, Bacteria METHICILLIN RESISTANT STAPHYLOCOCCUS AUREUS (A)  Final      Susceptibility   Methicillin resistant staphylococcus aureus - MIC*    CIPROFLOXACIN >=8 RESISTANT Resistant     GENTAMICIN >=16 RESISTANT Resistant     NITROFURANTOIN <=16 SENSITIVE Sensitive     OXACILLIN >=4 RESISTANT Resistant     TETRACYCLINE <=1 SENSITIVE Sensitive     VANCOMYCIN 1 SENSITIVE Sensitive     TRIMETH/SULFA <=10 SENSITIVE Sensitive     CLINDAMYCIN >=8 RESISTANT Resistant     RIFAMPIN <=0.5 SENSITIVE Sensitive     Inducible Clindamycin NEGATIVE Sensitive     * >=100,000 COLONIES/mL METHICILLIN RESISTANT STAPHYLOCOCCUS AUREUS  Blood Culture (routine x 2)     Status: None   Collection Time: 12/13/16 10:24 PM  Result Value Ref Range Status   Specimen Description BLOOD  R ARM  Final   Special Requests BOTTLES DRAWN AEROBIC AND ANAEROBIC  BCAV  Final   Culture NO GROWTH 5 DAYS  Final   Report Status 12/18/2016 FINAL  Final  CSF culture     Status: None   Collection Time: 12/15/16 11:30 AM  Result Value Ref Range Status   Specimen Description CSF  Final   Special Requests NONE  Final   Gram Stain   Final    MODERATE RED BLOOD CELLS PRESENT RARE WBC SEEN NO ORGANISMS SEEN    Culture   Final    NO GROWTH 3 DAYS Performed at [facility], [address] [place], [place] [address]    Report Status 12/19/2016 FINAL  Final  Culture, fungus without smear     Status: None (Preliminary result)    Collection Time: 12/15/16 11:30 AM  Result Value Ref Range Status   Specimen Description CSF  Final   Special Requests NONE  Final   Culture  Final    NO FUNGUS ISOLATED AFTER 7 DAYS Performed at [facility], [place] [address], [place], [place] [address]    Report Status PENDING  Incomplete    RADIOLOGY:  Dg Naso G Tube Plc W/fl W/rad  Result Date: 12/23/2016 CLINICAL DATA:  NG tube placement EXAM: NASO G TUBE PLACEMENT WITH FL AND WITH RAD FLUOROSCOPY TIME:  Fluoroscopy Time:  0.2 minute Radiation Exposure Index (if provided by the fluoroscopic device): 2 mGy Number of Acquired Spot Images: 0 COMPARISON:  None. FINDINGS: Patient presents for nasogastric tube placement under fluoroscopic guidance. A 16 French feeding tube was inserted through the right nare without difficulty. The feeding tube advanced normally without resistance. Tip of the nasogastric tube projects over the distal body of the stomach. The nasogastric tube was secured to the nose. Patient tolerated the exam without difficulty. IMPRESSION: 1. Successful placement of a 16 French feeding tube under fluoroscopic guidance. Electronically Signed   By: Kathreen Devoid   On: 12/23/2016 08:52     Management plans discussed with the patient, family and they are in agreement.  CODE STATUS: Full Code   TOTAL TIME TAKING CARE OF THIS PATIENT: 56 minutes.    Demetrios Loll M.D on 12/23/2016 at 7:57 PM  Between 7am to 6pm - Pager - [phone]  After 6pm go to [email or web address] - Proofreader  [facility]  Office  [phone]  CC: Primary care physician; Pcp Not In System   Note: This dictation was prepared with Dragon dictation along with smaller phrase technology. Any transcriptional errors that result from this process are unintentional.

## 2016-12-23 NOTE — Plan of Care (Signed)
Problem: Spiritual Needs Goal: Ability to function at adequate level Outcome: Not Progressing Pt has made no measurable progress toward goals, continues to have seizures intermittently, requiring intervention. Tube feeding begun today.

## 2016-12-23 NOTE — Progress Notes (Signed)
Nurse spoke with Dr. Bridgett Larsson. Patient will not be transferred to [facility]. [facility] at this time.

## 2016-12-23 NOTE — Progress Notes (Addendum)
[place] [facility]                                                                                                                                                                                  Patient Demographics   Darius [name], is a 51 y.o. male, DOB - 1966-05-18, FWY:[id number]  Admit date - 12/13/2016   Admitting Physician Lance Coon, MD  Outpatient Primary MD for the patient is Pcp Not In System   LOS - 3  Subjective: Patient admitted with acute encephalopathy. He is awake but still very confused. And not able to communicate much. His mother is at bedside. His mother reports that patient is disabled and lives at home alone but normally he is awake alert and communicative. He does have HIV and she states that last was seen and [facility]. His viral load was undetectable. According to her he is been taking his meds.  The patient is unresponsive to stimuli. s/p NGT placement. He had one Episode of seizure at 11 AM and given Ativan per RN.   Review of Systems:   CONSTITUTIONAL: Unable to provide  Vitals:   Vitals:   12/22/16 0831 12/22/16 1500 12/22/16 2320 12/23/16 0724  BP: 132/83 (!) 159/89 (!) 169/79 (!) 162/109  Pulse: 91 98 (!) 101 87  Resp: 16 18 18    Temp:  98.9 F (37.2 C) 99 F (37.2 C) 97.6 F (36.4 C)  TempSrc:  Oral Oral Oral  SpO2: 96%  97% 99%  Weight:      Height:        Wt Readings from Last 3 Encounters:  12/14/16 141 lb 8 oz (64.2 kg)  04/30/16 145 lb (65.8 kg)     Intake/Output Summary (Last 24 hours) at 12/23/16 1625 Last data filed at 12/23/16 0401  Gross per 24 hour  Intake          1965.83 ml  Output                0 ml  Net          1965.83 ml    Physical Exam:   GENERAL: Pleasant-appearing in no apparent distress. He is confused HEAD, EYES, EARS, NOSE AND THROAT: Atraumatic, normocephalic. . Pupils equal and reactive to light. Sclerae anicteric. No conjunctival injection.  Oral thrush. NECK: Supple. There is no jugular venous distention. No bruits, no lymphadenopathy, no thyromegaly.  HEART: Regular rate and rhythm,. No murmurs, no rubs, no clicks.  LUNGS: Clear to auscultation bilaterally. No rales or rhonchi. No wheezes.  ABDOMEN: Soft, flat, nontender, nondistended. Has good bowel sounds. No hepatosplenomegaly appreciated.  EXTREMITIES: No evidence  of any cyanosis, clubbing, or peripheral edema.  +2 pedal and radial pulses bilaterally.  NEUROLOGIC: The patient The patient is unresponsive to stimuli. on NGT. Eyes opened. SKIN: Moist and warm with no rashes appreciated.  Psych: Not anxious, depressed LN: No inguinal LN enlargement    Antibiotics   Anti-infectives    Start     Dose/Rate Route Frequency Ordered Stop   12/18/16 1800  vancomycin (VANCOCIN) 750 mg in dextrose 5 % 150 mL IVPB  Status:  Discontinued     750 mg 150 mL/hr over 60 Minutes Intravenous Every 12 hours 12/18/16 0902 12/19/16 1535   12/17/16 1800  vancomycin (VANCOCIN) IVPB 750 mg/150 ml premix  Status:  Discontinued     750 mg 150 mL/hr over 60 Minutes Intravenous Every 12 hours 12/17/16 1122 12/18/16 0853   12/16/16 1200  fluconazole (DIFLUCAN) IVPB 100 mg     100 mg 50 mL/hr over 60 Minutes Intravenous Every 24 hours 12/16/16 1140 12/18/16 1254   12/15/16 1630  vancomycin (VANCOCIN) IVPB 1000 mg/200 mL premix  Status:  Discontinued     1,000 mg 200 mL/hr over 60 Minutes Intravenous Every 12 hours 12/15/16 0522 12/15/16 0920   12/15/16 1630  vancomycin (VANCOCIN) IVPB 750 mg/150 ml premix  Status:  Discontinued     750 mg 150 mL/hr over 60 Minutes Intravenous Every 12 hours 12/15/16 0920 12/15/16 0930   12/15/16 1630  vancomycin (VANCOCIN) 750 mg in sodium chloride 0.9 % 150 mL IVPB  Status:  Discontinued     750 mg 150 mL/hr over 60 Minutes Intravenous Every 12 hours 12/15/16 0930 12/17/16 1122   12/15/16 0530  vancomycin (VANCOCIN) 250 mg in sodium chloride 0.9 % 100 mL IVPB   Status:  Discontinued     250 mg 100 mL/hr over 60 Minutes Intravenous  Once 12/15/16 0523 12/15/16 0525   12/14/16 1430  cefTRIAXone (ROCEPHIN) 2 g in dextrose 5 % 50 mL IVPB  Status:  Discontinued     2 g 100 mL/hr over 30 Minutes Intravenous Every 12 hours 12/14/16 1426 12/19/16 1535   12/14/16 1000  abacavir-dolutegravir-lamiVUDine (TRIUMEQ) 600-50-300 MG per tablet 1 tablet     1 tablet Oral Daily 12/14/16 0155     12/14/16 0600  piperacillin-tazobactam (ZOSYN) IVPB 3.375 g  Status:  Discontinued     3.375 g 12.5 mL/hr over 240 Minutes Intravenous Every 8 hours 12/13/16 2232 12/14/16 1426   12/14/16 0430  vancomycin (VANCOCIN) IVPB 750 mg/150 ml premix  Status:  Discontinued     750 mg 150 mL/hr over 60 Minutes Intravenous Every 12 hours 12/13/16 2232 12/15/16 0522   12/13/16 2215  piperacillin-tazobactam (ZOSYN) IVPB 3.375 g     3.375 g 100 mL/hr over 30 Minutes Intravenous  Once 12/13/16 2212 12/13/16 2310   12/13/16 2215  vancomycin (VANCOCIN) IVPB 1000 mg/200 mL premix     1,000 mg 200 mL/hr over 60 Minutes Intravenous  Once 12/13/16 2212 12/13/16 2325      Medications   Scheduled Meds: . abacavir-dolutegravir-lamiVUDine  1 tablet Oral Daily  . amLODipine  10 mg Per NG tube Daily  . chlorhexidine  15 mL Mouth Rinse BID  . enoxaparin (LOVENOX) injection  40 mg Subcutaneous Q24H  . FLUoxetine  10 mg Per Tube Daily  . free water  150 mL Per Tube Q6H  . [START ON 12/24/2016] lacosamide (VIMPAT) IV  200 mg Intravenous Q12H  . levETIRAcetam  1,500 mg Intravenous Q12H  . [START ON 12/24/2016]  levothyroxine  25 mcg Intravenous Daily  . lidocaine (PF)  5 mL Other Once  . mouth rinse  15 mL Mouth Rinse q12n4p   Continuous Infusions: . 0.9 % NaCl with KCl 20 mEq / L 50 mL/hr at 12/22/16 0205  . feeding supplement (JEVITY 1.2 CAL)     PRN Meds:.acetaminophen **OR** acetaminophen, hydrALAZINE, LORazepam, ondansetron **OR** ondansetron (ZOFRAN) IV   Data Review:   Micro  Results Recent Results (from the past 240 hour(s))  Blood Culture (routine x 2)     Status: None   Collection Time: 12/13/16  8:13 PM  Result Value Ref Range Status   Specimen Description BLOOD R ARM  Final   Special Requests BOTTLES DRAWN AEROBIC AND ANAEROBIC [place]  Final   Culture NO GROWTH 5 DAYS  Final   Report Status 12/18/2016 FINAL  Final  Urine culture     Status: Abnormal   Collection Time: 12/13/16  8:13 PM  Result Value Ref Range Status   Specimen Description URINE, RANDOM  Final   Special Requests NONE  Final   Culture (A)  Final    >=100,000 COLONIES/mL METHICILLIN RESISTANT STAPHYLOCOCCUS AUREUS   Report Status 12/16/2016 FINAL  Final   Organism ID, Bacteria METHICILLIN RESISTANT STAPHYLOCOCCUS AUREUS (A)  Final      Susceptibility   Methicillin resistant staphylococcus aureus - MIC*    CIPROFLOXACIN >=8 RESISTANT Resistant     GENTAMICIN >=16 RESISTANT Resistant     NITROFURANTOIN <=16 SENSITIVE Sensitive     OXACILLIN >=4 RESISTANT Resistant     TETRACYCLINE <=1 SENSITIVE Sensitive     VANCOMYCIN 1 SENSITIVE Sensitive     TRIMETH/SULFA <=10 SENSITIVE Sensitive     CLINDAMYCIN >=8 RESISTANT Resistant     RIFAMPIN <=0.5 SENSITIVE Sensitive     Inducible Clindamycin NEGATIVE Sensitive     * >=100,000 COLONIES/mL METHICILLIN RESISTANT STAPHYLOCOCCUS AUREUS  Blood Culture (routine x 2)     Status: None   Collection Time: 12/13/16 10:24 PM  Result Value Ref Range Status   Specimen Description BLOOD  R ARM  Final   Special Requests BOTTLES DRAWN AEROBIC AND ANAEROBIC  BCAV  Final   Culture NO GROWTH 5 DAYS  Final   Report Status 12/18/2016 FINAL  Final  CSF culture     Status: None   Collection Time: 12/15/16 11:30 AM  Result Value Ref Range Status   Specimen Description CSF  Final   Special Requests NONE  Final   Gram Stain   Final    MODERATE RED BLOOD CELLS PRESENT RARE WBC SEEN NO ORGANISMS SEEN    Culture   Final    NO GROWTH 3 DAYS Performed at [facility], [address], [place], [place] [address]    Report Status 12/19/2016 FINAL  Final  Culture, fungus without smear     Status: None (Preliminary result)   Collection Time: 12/15/16 11:30 AM  Result Value Ref Range Status   Specimen Description CSF  Final   Special Requests NONE  Final   Culture   Final    NO FUNGUS ISOLATED AFTER 7 DAYS Performed at [facility], [place] [address], [place], [place] [address]    Report Status PENDING  Incomplete    Radiology Reports Ct Head Wo Contrast  Result Date: 12/13/2016 CLINICAL DATA:  Acute onset of generalized weakness. Patient not speaking. Initial encounter. EXAM: CT HEAD WITHOUT CONTRAST TECHNIQUE: Contiguous axial images were obtained from the base of the  skull through the vertex without intravenous contrast. COMPARISON:  CT of the head performed 04/29/2016 FINDINGS: Brain: No evidence of acute infarction, hemorrhage, hydrocephalus, extra-axial collection or mass lesion/mass effect. Prominence of the ventricles and sulci reflects moderate cortical volume loss. Mild cerebellar atrophy is noted. Scattered periventricular and subcortical white matter change likely reflects small vessel ischemic microangiopathy. Chronic encephalomalacia is noted at the left basal ganglia, and there is ex vacuo dilatation of the left lateral ventricle. Calcification is seen at the pons. No mass effect or midline shift is seen. Chronic subdural thickening is noted on the left side. Vascular: No hyperdense vessel or unexpected calcification. Skull: There is no evidence of fracture; the patient is status post left frontoparietal craniotomy. Sinuses/Orbits: The visualized portions of the orbits are within normal limits. The paranasal sinuses and mastoid air cells are well-aerated. Other: No significant soft tissue abnormalities are seen. IMPRESSION: 1. No acute intracranial pathology seen on CT. 2. Moderate cortical volume loss and scattered small vessel  ischemic microangiopathy. 3. Chronic encephalomalacia at the left basal ganglia, with ex vacuo dilatation of the left lateral ventricle. Chronic subdural thickening on the left side. Electronically Signed   By: Garald Balding M.D.   On: 12/13/2016 23:02   Dg Chest Port 1 View  Result Date: 12/13/2016 CLINICAL DATA:  Sepsis EXAM: PORTABLE CHEST 1 VIEW COMPARISON:  04/29/2014 FINDINGS: Cardiomediastinal silhouette is stable. No infiltrate or pleural effusion. No pulmonary edema. Bony thorax is unremarkable. IMPRESSION: No active disease. Electronically Signed   By: Lahoma Crocker M.D.   On: 12/13/2016 21:41   Dg Addison Bailey G Tube Plc W/fl W/rad  Result Date: 12/23/2016 CLINICAL DATA:  NG tube placement EXAM: NASO G TUBE PLACEMENT WITH FL AND WITH RAD FLUOROSCOPY TIME:  Fluoroscopy Time:  0.2 minute Radiation Exposure Index (if provided by the fluoroscopic device): 2 mGy Number of Acquired Spot Images: 0 COMPARISON:  None. FINDINGS: Patient presents for nasogastric tube placement under fluoroscopic guidance. A 16 French feeding tube was inserted through the right nare without difficulty. The feeding tube advanced normally without resistance. Tip of the nasogastric tube projects over the distal body of the stomach. The nasogastric tube was secured to the nose. Patient tolerated the exam without difficulty. IMPRESSION: 1. Successful placement of a 16 French feeding tube under fluoroscopic guidance. Electronically Signed   By: Kathreen Devoid   On: 12/23/2016 08:52   Dg [place] Dear Of Needle/cath Tip For Spinal Inject Lt  Result Date: 12/15/2016 CLINICAL DATA:  Abnormal mental status. EXAM: DIAGNOSTIC LUMBAR PUNCTURE UNDER FLUOROSCOPIC GUIDANCE FLUOROSCOPY TIME:  Fluoroscopy Time:  0 minutes 20 seconds Radiation Exposure Index (if provided by the fluoroscopic device): 5.4 mGy Number of Acquired Spot Images: 1 PROCEDURE: Patient unable to give consent. After discussing the risks and benefits of this procedure with the  patient's mother informed consent was obtained. Back was sterilely prepped and draped. Local anesthesia administered 1% lidocaine. 22 gauge needle was advanced into the lumbar canal and clear 9 cc of clear CSF removed and sent for the requested labs. Opening pressure was 9 cm of water. Needle was removed. Hemostasis achieved. No complications. IMPRESSION: Successful fluoroscopically directed lumbar puncture. Electronically Signed   By: Marcello Moores  Register   On: 12/15/2016 12:40     CBC  Recent Labs Lab 12/18/16 0406 12/21/16 0447  WBC 7.5  7.7 7.3  HGB 12.6* 12.9*  HCT 37.7*  36.3* 39.0*  PLT 150  180 189  MCV 99.8  98* 99.7  MCH  32.8  33.4 33.1  MCHC 33.3  33.5 33.2  RDW 14.1  14.0 14.2  LYMPHSABS 1.8  --   EOSABS 0.2  --   BASOSABS 0.0  --     Chemistries   Recent Labs Lab 12/18/16 0406 12/21/16 0447 12/23/16 0651  NA 142 138  --   K 3.9 3.8  --   CL 112* 108  --   CO2 20* 21*  --   GLUCOSE 80 81  --   BUN 15 13  --   CREATININE 1.17 0.95  --   CALCIUM 8.7* 8.9  --   MG  --   --  1.9  AST  --  57*  --   ALT  --  52  --   ALKPHOS  --  57  --   BILITOT  --  0.7  --    ------------------------------------------------------------------------------------------------------------------ estimated creatinine clearance is 86.4 mL/min (by C-G formula based on SCr of 0.95 mg/dL). ------------------------------------------------------------------------------------------------------------------ No results for input(s): HGBA1C in the last 72 hours. ------------------------------------------------------------------------------------------------------------------ No results for input(s): CHOL, HDL, LDLCALC, TRIG, CHOLHDL, LDLDIRECT in the last 72 hours. ------------------------------------------------------------------------------------------------------------------ No results for input(s): TSH, T4TOTAL, T3FREE, THYROIDAB in the last 72 hours.  Invalid input(s):  FREET3 ------------------------------------------------------------------------------------------------------------------ No results for input(s): VITAMINB12, FOLATE, FERRITIN, TIBC, IRON, RETICCTPCT in the last 72 hours.  Coagulation profile No results for input(s): INR, PROTIME in the last 168 hours.  No results for input(s): DDIMER in the last 72 hours.  Cardiac Enzymes No results for input(s): CKMB, TROPONINI, MYOGLOBIN in the last 168 hours.  Invalid input(s): CK ------------------------------------------------------------------------------------------------------------------ Invalid input(s): [place]  Patient is a 51 year old with HIV with acute mental status changes  1. Acute encephalopathy: Ammonia is slightly abnormal not the cause for his encephalopathy.  Per Dr. Ola Spurr, Check serum crypto, RPR, TSH: Unremarkable.  LP done,  routine labs plus Crypto ag and CSF VDRL: negative. Follow-up culture. Blood cultures negative. Urinalysis is normal but the urine culture show MRSA. cont vanco, changed zosyn to ceftraixone 2 gm q 12. Per Dr. Ola Spurr DC all abx - no evidence meningitis or infection. MRSA in Urine is likley contaminant given neg UA.  Per Dr. Doy Mince, On [place].  EEG shows no evidence of electrographic seizure.  Initially not suggestive of infection.  Cryptococcal antigen negative. 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 Per Psych consult, Most likely all of the mental status changes are not related to "psychiatric" but rather to "neurologic" illness.   Per Dr. Macie Burows, Would consider doing another EEG and decreasing Keppra more but do not want to do it when EEG not available.  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 Prozac started. Started on Provigil 100 daily which can lower seizure threshold, discontinued.  2. Seizure activity. Seizure activity today, given ativan. On neuro check every 2 hours,  Ativan IV when necessary, Started Vimpat.  200mg  IV load with 100mg  IV maintenance q12 hours. Continue Keppra at 1500mg  q 12 hours,  Continue Vimpat and Keppra at the current doses, the patient needs to be transferred to her level hospital for continuous EEG monitor per neurologist Dr. Doy Mince. I called [facility] for possible transfer. I discussed the on-call hospitalist, who accept the patient, but no bed is available. The patient is on waiting list.  3. Hypothyroidism continue Synthroid.  4. HIV continue HIV meds. 5.DVT prophylaxis, resumed Lovenox.  CKD stage II. Stable.  Cocaine abuse. Per  drug screen.  Oral thrush. Was on iv Diflucan. Accelerated hypertension. continue Norvasc, on IV hydralazine when necessary. BP better controlled.  I discussed with  Dr. Doy Mince. The patient mother does not want the patient to be transferred to [facility] because it is far away from home. She wants transfer to [facility]. I explained to her that it will take time to transfer to [facility]. She said she would rather wait.     Code Status Orders        Start     Ordered   12/14/16 0156  Full code  Continuous     12/14/16 0155    Code Status History    Date Active Date Inactive Code Status Order ID Comments User Context   04/30/2016 12:53 AM 04/30/2016  5:45 PM Full Code [id number]  Lance Coon, MD Inpatient      Consults  Id ,neuro DVT Prophylaxis  scd's   Lab Results  Component Value Date   PLT 189 12/21/2016     Time Spent in minutes   65 min  Greater than 50% of time spent in care coordination and counseling patient regarding the condition and plan of care.   Demetrios Loll M.D on 12/23/2016 at 4:25 PM  Between 7am to 6pm - Pager - [phone]  After 6pm go to [email or web address] - password [email or web address] [place] [facility]   Office  [phone]

## 2016-12-23 NOTE — Progress Notes (Signed)
Subjective: Patient remains poorly responsive.  NGT placed.  On Keppra and Vimpat.  Objective: Current vital signs: BP (!) 162/109   Pulse 87   Temp 97.6 F (36.4 C) (Oral)   Resp 18   Ht 6' (1.829 m)   Wt 64.2 kg (141 lb 8 oz)   SpO2 99%   BMI 19.19 kg/m  Vital signs in last 24 hours: Temp:  [97.6 F (36.4 C)-99 F (37.2 C)] 97.6 F (36.4 C) (04/06 0724) Pulse Rate:  [87-101] 87 (04/06 0724) Resp:  [18] 18 (04/05 2320) BP: (159-169)/(79-109) 162/109 (04/06 0724) SpO2:  [97 %-99 %] 99 % (04/06 0724)  Intake/Output from previous day: 04/05 0701 - 04/06 0700 In: 1965.8 [I.V.:1850.8; IV Piggyback:115] Out: -  Intake/Output this shift: No intake/output data recorded. Nutritional status: Diet NPO time specified Except for: Sips with Meds  Neurologic Exam: Mental Status: Eyes open. No verbal response noted but does grunt. Does not answer any orientation questions. Does not follow commands.  Cranial Nerves: II: Pupils equal, round, reactive to light and accommodation III,IV, VI: ptosis not present, right gaze preference but voluntarily goes beyond midline to the left V,VII: Facesymmetric VIII: hearing normal bilaterally IX,X: unable to test XI: unable to test XII: unable to test Motor: Localizes to pain with RUE.  Continued minimal movement of the LUE and legs Sensory: Does not respond to noxious stimuli in any extremity  Lab Results: Basic Metabolic Panel:  Recent Labs Lab 12/18/16 0406 12/21/16 0447 12/23/16 0651  NA 142 138  --   K 3.9 3.8  --   CL 112* 108  --   CO2 20* 21*  --   GLUCOSE 80 81  --   BUN 15 13  --   CREATININE 1.17 0.95  --   CALCIUM 8.7* 8.9  --   MG  --   --  1.9  PHOS  --   --  3.5    Liver Function Tests:  Recent Labs Lab 12/21/16 0447  AST 57*  ALT 52  ALKPHOS 57  BILITOT 0.7  PROT 8.4*  ALBUMIN 3.7   No results for input(s): LIPASE, AMYLASE in the last 168 hours. No results for input(s): AMMONIA in the last 168  hours.  CBC:  Recent Labs Lab 12/18/16 0406 12/21/16 0447  WBC 7.5  7.7 7.3  NEUTROABS 4.7  --   HGB 12.6* 12.9*  HCT 37.7*  36.3* 39.0*  MCV 99.8  98* 99.7  PLT 150  180 189    Cardiac Enzymes: No results for input(s): CKTOTAL, CKMB, CKMBINDEX, TROPONINI in the last 168 hours.  Lipid Panel: No results for input(s): CHOL, TRIG, HDL, CHOLHDL, VLDL, LDLCALC in the last 168 hours.  CBG:  Recent Labs Lab 12/22/16 2022 12/23/16 0427  GLUCAP 90 87    Microbiology: Results for orders placed or performed during the hospital encounter of 12/13/16  Blood Culture (routine x 2)     Status: None   Collection Time: 12/13/16  8:13 PM  Result Value Ref Range Status   Specimen Description BLOOD R ARM  Final   Special Requests BOTTLES DRAWN AEROBIC AND ANAEROBIC [place]  Final   Culture NO GROWTH 5 DAYS  Final   Report Status 12/18/2016 FINAL  Final  Urine culture     Status: Abnormal   Collection Time: 12/13/16  8:13 PM  Result Value Ref Range Status   Specimen Description URINE, RANDOM  Final   Special Requests NONE  Final   Culture (  A)  Final    >=100,000 COLONIES/mL METHICILLIN RESISTANT STAPHYLOCOCCUS AUREUS   Report Status 12/16/2016 FINAL  Final   Organism ID, Bacteria METHICILLIN RESISTANT STAPHYLOCOCCUS AUREUS (A)  Final      Susceptibility   Methicillin resistant staphylococcus aureus - MIC*    CIPROFLOXACIN >=8 RESISTANT Resistant     GENTAMICIN >=16 RESISTANT Resistant     NITROFURANTOIN <=16 SENSITIVE Sensitive     OXACILLIN >=4 RESISTANT Resistant     TETRACYCLINE <=1 SENSITIVE Sensitive     VANCOMYCIN 1 SENSITIVE Sensitive     TRIMETH/SULFA <=10 SENSITIVE Sensitive     CLINDAMYCIN >=8 RESISTANT Resistant     RIFAMPIN <=0.5 SENSITIVE Sensitive     Inducible Clindamycin NEGATIVE Sensitive     * >=100,000 COLONIES/mL METHICILLIN RESISTANT STAPHYLOCOCCUS AUREUS  Blood Culture (routine x 2)     Status: None   Collection Time: 12/13/16 10:24 PM  Result Value  Ref Range Status   Specimen Description BLOOD  R ARM  Final   Special Requests BOTTLES DRAWN AEROBIC AND ANAEROBIC  BCAV  Final   Culture NO GROWTH 5 DAYS  Final   Report Status 12/18/2016 FINAL  Final  CSF culture     Status: None   Collection Time: 12/15/16 11:30 AM  Result Value Ref Range Status   Specimen Description CSF  Final   Special Requests NONE  Final   Gram Stain   Final    MODERATE RED BLOOD CELLS PRESENT RARE WBC SEEN NO ORGANISMS SEEN    Culture   Final    NO GROWTH 3 DAYS Performed at [facility], [address] [place], [place] [address]    Report Status 12/19/2016 FINAL  Final  Culture, fungus without smear     Status: None (Preliminary result)   Collection Time: 12/15/16 11:30 AM  Result Value Ref Range Status   Specimen Description CSF  Final   Special Requests NONE  Final   Culture   Final    NO FUNGUS ISOLATED AFTER 3 DAYS Performed at [facility], [place] [address] [place], [place] [address]    Report Status PENDING  Incomplete    Coagulation Studies: No results for input(s): LABPROT, INR in the last 72 hours.  Imaging: Dg Naso G Tube Plc W/fl W/rad  Result Date: 12/23/2016 CLINICAL DATA:  NG tube placement EXAM: NASO G TUBE PLACEMENT WITH FL AND WITH RAD FLUOROSCOPY TIME:  Fluoroscopy Time:  0.2 minute Radiation Exposure Index (if provided by the fluoroscopic device): 2 mGy Number of Acquired Spot Images: 0 COMPARISON:  None. FINDINGS: Patient presents for nasogastric tube placement under fluoroscopic guidance. A 16 French feeding tube was inserted through the right nare without difficulty. The feeding tube advanced normally without resistance. Tip of the nasogastric tube projects over the distal body of the stomach. The nasogastric tube was secured to the nose. Patient tolerated the exam without difficulty. IMPRESSION: 1. Successful placement of a 16 French feeding tube under fluoroscopic guidance. Electronically Signed   By: Kathreen Devoid    On: 12/23/2016 08:52    Medications:  I have reviewed the patient's current medications. Scheduled: . abacavir-dolutegravir-lamiVUDine  1 tablet Oral Daily  . amLODipine  10 mg Per NG tube Daily  . chlorhexidine  15 mL Mouth Rinse BID  . enoxaparin (LOVENOX) injection  40 mg Subcutaneous Q24H  . FLUoxetine  10 mg Per Tube Daily  . free water  150 mL Per Tube Q6H  . lacosamide (VIMPAT)  IV  150 mg Intravenous Q12H  . levETIRAcetam  1,500 mg Intravenous Q12H  . lidocaine (PF)  5 mL Other Once  . mouth rinse  15 mL Mouth Rinse q12n4p    Assessment/Plan: Patient remains not verbally responsive and does not follow commands but today gaze not forced.  EEG showed some frontal artifact that was not felt to be seizure but an increase was made in Vimpat.  Patient continues on Keppra and Vimpat.    Recommendations: 1.  Continue Vimpat and Keppra at the current doses 2.  Continue seizure precautions 3.  Will continue to follow with you   LOS: 3 days   Alexis Goodell, MD Neurology [phone] 12/23/2016  10:51 AM

## 2016-12-23 NOTE — Progress Notes (Signed)
Pt is changed for incontinence, is more alert, vocalizing during changing but all limbs remain stiff when pt is moved. Tube feeding hung, jevity 1.2 at 70mls/hr with flush of 161mls q6h to ng tube in r nare, per dietary recommendations. Ng placement verified by air bolus.

## 2016-12-23 NOTE — Progress Notes (Signed)
Shift assessment completed at 0730. Pt to be sent to radiology for ng tube placement. On assessment, this writer received no response from r pupil, l pupil was sluggish. All limbs with immediate drift, although pt pushes against staff when being turned and draws up arm during bp check. Pt had positive reflex when bottom of R foot was stroked, no reflex noted when bottom of l foot was stroked. Pt did not make eye contact, bit on suction catheter when oral care attempted. Midline intact to rua with iv ns with 20 meq kcl infusing at 51mls/hr, site is free of redness and swelling. Pt left the unit to go to radiology and returned at approx 0900 with ng tube intact to r nare. This Probation officer entered pt's room at 1100 and found pt to be seizing, whites of eyes showing and pt had tremors to l shoulder, arm, and leg. Pt received two mg of ativan ivp and tremors ceased at 1104. Pt has had no tremors witnessed since, hopsitalist was text paged to inform and arrived to check on the pt again afterwards. Pt has been incontinent of stool and urine, ng tube used for meds and free water after placement verified by air bolus. Pt has cooperated with oral care attempts only once.

## 2016-12-23 NOTE — Progress Notes (Signed)
SLP Cancellation Note  Patient Details Name: Darius [name] MRN: [id number] DOB: 09/02/1966   Cancelled treatment:       Reason Eval/Treat Not Completed: Medical issues which prohibited therapy;Patient's level of consciousness (chart reviewed re: pt's status). Pt's alertness and participation w/ tasks to take po's is poor; MD has consulted family/Neurology and is attempting NG placement for alternative means of feeding for pt d/t length of time he has not taken po's/participated in tx session(po tirals). ST services will continue to f/u w/ pt's status and attempt tx sessions of po trials when pt is able to adequately/safely participate. Recommend frequent oral care for oral stimulation for swallowing.     Orinda Kenner, MS, CCC-SLP Watson,Katherine 12/23/2016, 10:55 AM

## 2016-12-24 ENCOUNTER — Inpatient Hospital Stay: Payer: Medicare Other

## 2016-12-24 LAB — GLUCOSE, CAPILLARY
GLUCOSE-CAPILLARY: 96 mg/dL (ref 65–99)
GLUCOSE-CAPILLARY: 110 mg/dL — AB (ref 65–99)
Glucose-Capillary: 101 mg/dL — ABNORMAL HIGH (ref 65–99)
Glucose-Capillary: 108 mg/dL — ABNORMAL HIGH (ref 65–99)
Glucose-Capillary: 114 mg/dL — ABNORMAL HIGH (ref 65–99)
Glucose-Capillary: 93 mg/dL (ref 65–99)
Glucose-Capillary: 97 mg/dL (ref 65–99)

## 2016-12-24 NOTE — Progress Notes (Signed)
Subjective: Patient unchanged.  Seizure noted yesterday by staff.  Vimpat increased.  Efforts in place for possible transfer.    Objective: Current vital signs: BP 121/89   Pulse 85   Temp 97.7 F (36.5 C) (Oral)   Resp 19   Ht 6' (1.829 m)   Wt 62.6 kg (138 lb)   SpO2 100%   BMI 18.72 kg/m  Vital signs in last 24 hours: Temp:  [97.7 F (36.5 C)-97.8 F (36.6 C)] 97.7 F (36.5 C) (04/07 0724) Pulse Rate:  [82-102] 85 (04/07 0724) Resp:  [18-19] 19 (04/07 0724) BP: (115-139)/(81-91) 121/89 (04/07 0724) SpO2:  [98 %-100 %] 100 % (04/07 0724) Weight:  [62.6 kg (138 lb)] 62.6 kg (138 lb) (04/06 1921)  Intake/Output from previous day: 04/06 0701 - 04/07 0700 In: 2236.2 [I.V.:1199.2; NG/GT:762; IV Piggyback:275] Out: 5 [Emesis/NG output:5] Intake/Output this shift: No intake/output data recorded. Nutritional status: Diet NPO time specified Except for: Sips with Meds  Neurologic Exam: Mental Status: Eyes open. No verbal response noted but does grunt. Does not answer any orientation questions. Does not follow commands.  Cranial Nerves: II: Pupils equal, round, reactive to light and accommodation III,IV, VI: ptosis not present, right gaze preference, can not go beyond midline V,VII: Facesymmetric VIII: hearing normal bilaterally IX,X: unable to test XI: unable to test XII: unable to test Motor: Localizes to pain with RUE.  Continued minimal movement of the LUE and legs Sensory: Does not respond to noxious stimuli in any extremity  Lab Results: Basic Metabolic Panel:  Recent Labs Lab 12/18/16 0406 12/21/16 0447 12/23/16 0651  NA 142 138  --   K 3.9 3.8  --   CL 112* 108  --   CO2 20* 21*  --   GLUCOSE 80 81  --   BUN 15 13  --   CREATININE 1.17 0.95  --   CALCIUM 8.7* 8.9  --   MG  --   --  1.9  PHOS  --   --  3.5    Liver Function Tests:  Recent Labs Lab 12/21/16 0447  AST 57*  ALT 52  ALKPHOS 57  BILITOT 0.7  PROT 8.4*  ALBUMIN 3.7   No  results for input(s): LIPASE, AMYLASE in the last 168 hours. No results for input(s): AMMONIA in the last 168 hours.  CBC:  Recent Labs Lab 12/18/16 0406 12/21/16 0447  WBC 7.5  7.7 7.3  NEUTROABS 4.7  --   HGB 12.6* 12.9*  HCT 37.7*  36.3* 39.0*  MCV 99.8  98* 99.7  PLT 150  180 189    Cardiac Enzymes: No results for input(s): CKTOTAL, CKMB, CKMBINDEX, TROPONINI in the last 168 hours.  Lipid Panel: No results for input(s): CHOL, TRIG, HDL, CHOLHDL, VLDL, LDLCALC in the last 168 hours.  CBG:  Recent Labs Lab 12/23/16 1618 12/23/16 1951 12/24/16 0034 12/24/16 0418 12/24/16 0729  GLUCAP 97 104* 97 93 108*    Microbiology: Results for orders placed or performed during the hospital encounter of 12/13/16  Blood Culture (routine x 2)     Status: None   Collection Time: 12/13/16  8:13 PM  Result Value Ref Range Status   Specimen Description BLOOD R ARM  Final   Special Requests BOTTLES DRAWN AEROBIC AND ANAEROBIC [place]  Final   Culture NO GROWTH 5 DAYS  Final   Report Status 12/18/2016 FINAL  Final  Urine culture     Status: Abnormal   Collection Time: 12/13/16  8:13  PM  Result Value Ref Range Status   Specimen Description URINE, RANDOM  Final   Special Requests NONE  Final   Culture (A)  Final    >=100,000 COLONIES/mL METHICILLIN RESISTANT STAPHYLOCOCCUS AUREUS   Report Status 12/16/2016 FINAL  Final   Organism ID, Bacteria METHICILLIN RESISTANT STAPHYLOCOCCUS AUREUS (A)  Final      Susceptibility   Methicillin resistant staphylococcus aureus - MIC*    CIPROFLOXACIN >=8 RESISTANT Resistant     GENTAMICIN >=16 RESISTANT Resistant     NITROFURANTOIN <=16 SENSITIVE Sensitive     OXACILLIN >=4 RESISTANT Resistant     TETRACYCLINE <=1 SENSITIVE Sensitive     VANCOMYCIN 1 SENSITIVE Sensitive     TRIMETH/SULFA <=10 SENSITIVE Sensitive     CLINDAMYCIN >=8 RESISTANT Resistant     RIFAMPIN <=0.5 SENSITIVE Sensitive     Inducible Clindamycin NEGATIVE Sensitive     *  >=100,000 COLONIES/mL METHICILLIN RESISTANT STAPHYLOCOCCUS AUREUS  Blood Culture (routine x 2)     Status: None   Collection Time: 12/13/16 10:24 PM  Result Value Ref Range Status   Specimen Description BLOOD  R ARM  Final   Special Requests BOTTLES DRAWN AEROBIC AND ANAEROBIC  BCAV  Final   Culture NO GROWTH 5 DAYS  Final   Report Status 12/18/2016 FINAL  Final  CSF culture     Status: None   Collection Time: 12/15/16 11:30 AM  Result Value Ref Range Status   Specimen Description CSF  Final   Special Requests NONE  Final   Gram Stain   Final    MODERATE RED BLOOD CELLS PRESENT RARE WBC SEEN NO ORGANISMS SEEN    Culture   Final    NO GROWTH 3 DAYS Performed at [facility], [address] [place], [place] [address]    Report Status 12/19/2016 FINAL  Final  Culture, fungus without smear     Status: None (Preliminary result)   Collection Time: 12/15/16 11:30 AM  Result Value Ref Range Status   Specimen Description CSF  Final   Special Requests NONE  Final   Culture   Final    NO FUNGUS ISOLATED AFTER 7 DAYS Performed at [facility], [place] [address], [place], [place] [address]    Report Status PENDING  Incomplete    Coagulation Studies: No results for input(s): LABPROT, INR in the last 72 hours.  Imaging: Dg Naso G Tube Plc W/fl W/rad  Result Date: 12/23/2016 CLINICAL DATA:  NG tube placement EXAM: NASO G TUBE PLACEMENT WITH FL AND WITH RAD FLUOROSCOPY TIME:  Fluoroscopy Time:  0.2 minute Radiation Exposure Index (if provided by the fluoroscopic device): 2 mGy Number of Acquired Spot Images: 0 COMPARISON:  None. FINDINGS: Patient presents for nasogastric tube placement under fluoroscopic guidance. A 16 French feeding tube was inserted through the right nare without difficulty. The feeding tube advanced normally without resistance. Tip of the nasogastric tube projects over the distal body of the stomach. The nasogastric tube was secured to the nose. Patient tolerated  the exam without difficulty. IMPRESSION: 1. Successful placement of a 16 French feeding tube under fluoroscopic guidance. Electronically Signed   By: Kathreen Devoid   On: 12/23/2016 08:52    Medications:  I have reviewed the patient's current medications. Scheduled: . abacavir-dolutegravir-lamiVUDine  1 tablet Oral Daily  . amLODipine  10 mg Per NG tube Daily  . chlorhexidine  15 mL Mouth Rinse BID  . enoxaparin (LOVENOX) injection  40 mg  Subcutaneous Q24H  . FLUoxetine  10 mg Per Tube Daily  . free water  150 mL Per Tube Q6H  . lacosamide (VIMPAT) IV  200 mg Intravenous Q12H  . levETIRAcetam  1,500 mg Intravenous Q12H  . levothyroxine  25 mcg Intravenous Daily  . lidocaine (PF)  5 mL Other Once  . mouth rinse  15 mL Mouth Rinse q12n4p    Assessment/Plan: Patient now on maximum Keppra and Vimpat.  No further clinical seizure activity noted.  Unclear if some subclinical activity is noted.  No access to even routine EEG evaluation over the weekend.  Patient to have MRI of the brain today.    Recommendations: 1.  If further seizure activity noted, would start Depakote at 1000mg  load and maintenance of 500mg  q 8 hours, with levels to be followed.   2.  Continue Keppra and Vimpat at current doses.  3.  Continue seizure precautions    LOS: 4 days   Alexis Goodell, MD Neurology [phone] 12/24/2016  10:22 AM

## 2016-12-24 NOTE — Progress Notes (Signed)
Pt tolerating tube feeding without difficulty. No seizure activity noted during the night.

## 2016-12-24 NOTE — Progress Notes (Signed)
[place] [facility]                                                                                                                                                                                  Patient Demographics   Darius [name], is a 51 y.o. male, DOB - 1966-04-01, PNT:[id number]  Admit date - 12/13/2016   Admitting Physician Lance Coon, MD  Outpatient Primary MD for the patient is Pcp Not In System   LOS - 4  Subjective: Patient admitted with acute encephalopathy. He is awake but still very confused. And not able to communicate much. His mother is at bedside. His mother reports that patient is disabled and lives at home alone but normally he is awake alert and communicative. He does have HIV and she states that last was seen and [facility]. His viral load was undetectable. According to her he is been taking his meds.  The patient is unresponsive to stimuli. s/p NGT placement. He tolerated tube feeding. No seizure activity since yesterday.  Review of Systems:   CONSTITUTIONAL: Unable to provide  Vitals:   Vitals:   12/23/16 1921 12/23/16 1953 12/24/16 0613 12/24/16 0724  BP:  137/89 115/81 121/89  Pulse:  94 82 85  Resp:  19 18 19   Temp:  97.7 F (36.5 C) 97.8 F (36.6 C) 97.7 F (36.5 C)  TempSrc:  Oral Axillary Oral  SpO2:  100% 100% 100%  Weight: 138 lb (62.6 kg)     Height:        Wt Readings from Last 3 Encounters:  12/23/16 138 lb (62.6 kg)  04/30/16 145 lb (65.8 kg)     Intake/Output Summary (Last 24 hours) at 12/24/16 1344 Last data filed at 12/24/16 0617  Gross per 24 hour  Intake          2236.17 ml  Output                5 ml  Net          2231.17 ml    Physical Exam:   GENERAL: Pleasant-appearing in no apparent distress. He is confused HEAD, EYES, EARS, NOSE AND THROAT: Atraumatic, normocephalic. . Pupils equal and reactive to light. Sclerae anicteric. No conjunctival injection. Oral  thrush. NECK: Supple. There is no jugular venous distention. No bruits, no lymphadenopathy, no thyromegaly.  HEART: Regular rate and rhythm,. No murmurs, no rubs, no clicks.  LUNGS: Clear to auscultation bilaterally. No rales or rhonchi. No wheezes.  ABDOMEN: Soft, flat, nontender, nondistended. Has good bowel sounds. No hepatosplenomegaly appreciated.  EXTREMITIES: No evidence of any cyanosis, clubbing, or peripheral edema.  +2  pedal and radial pulses bilaterally.  NEUROLOGIC: The patient The patient is unresponsive to stimuli. on NGT. Eyes opened. SKIN: Moist and warm with no rashes appreciated.  Psych: Not anxious, depressed LN: No inguinal LN enlargement    Antibiotics   Anti-infectives    Start     Dose/Rate Route Frequency Ordered Stop   12/18/16 1800  vancomycin (VANCOCIN) 750 mg in dextrose 5 % 150 mL IVPB  Status:  Discontinued     750 mg 150 mL/hr over 60 Minutes Intravenous Every 12 hours 12/18/16 0902 12/19/16 1535   12/17/16 1800  vancomycin (VANCOCIN) IVPB 750 mg/150 ml premix  Status:  Discontinued     750 mg 150 mL/hr over 60 Minutes Intravenous Every 12 hours 12/17/16 1122 12/18/16 0853   12/16/16 1200  fluconazole (DIFLUCAN) IVPB 100 mg     100 mg 50 mL/hr over 60 Minutes Intravenous Every 24 hours 12/16/16 1140 12/18/16 1254   12/15/16 1630  vancomycin (VANCOCIN) IVPB 1000 mg/200 mL premix  Status:  Discontinued     1,000 mg 200 mL/hr over 60 Minutes Intravenous Every 12 hours 12/15/16 0522 12/15/16 0920   12/15/16 1630  vancomycin (VANCOCIN) IVPB 750 mg/150 ml premix  Status:  Discontinued     750 mg 150 mL/hr over 60 Minutes Intravenous Every 12 hours 12/15/16 0920 12/15/16 0930   12/15/16 1630  vancomycin (VANCOCIN) 750 mg in sodium chloride 0.9 % 150 mL IVPB  Status:  Discontinued     750 mg 150 mL/hr over 60 Minutes Intravenous Every 12 hours 12/15/16 0930 12/17/16 1122   12/15/16 0530  vancomycin (VANCOCIN) 250 mg in sodium chloride 0.9 % 100 mL IVPB   Status:  Discontinued     250 mg 100 mL/hr over 60 Minutes Intravenous  Once 12/15/16 0523 12/15/16 0525   12/14/16 1430  cefTRIAXone (ROCEPHIN) 2 g in dextrose 5 % 50 mL IVPB  Status:  Discontinued     2 g 100 mL/hr over 30 Minutes Intravenous Every 12 hours 12/14/16 1426 12/19/16 1535   12/14/16 1000  abacavir-dolutegravir-lamiVUDine (TRIUMEQ) 600-50-300 MG per tablet 1 tablet     1 tablet Oral Daily 12/14/16 0155     12/14/16 0600  piperacillin-tazobactam (ZOSYN) IVPB 3.375 g  Status:  Discontinued     3.375 g 12.5 mL/hr over 240 Minutes Intravenous Every 8 hours 12/13/16 2232 12/14/16 1426   12/14/16 0430  vancomycin (VANCOCIN) IVPB 750 mg/150 ml premix  Status:  Discontinued     750 mg 150 mL/hr over 60 Minutes Intravenous Every 12 hours 12/13/16 2232 12/15/16 0522   12/13/16 2215  piperacillin-tazobactam (ZOSYN) IVPB 3.375 g     3.375 g 100 mL/hr over 30 Minutes Intravenous  Once 12/13/16 2212 12/13/16 2310   12/13/16 2215  vancomycin (VANCOCIN) IVPB 1000 mg/200 mL premix     1,000 mg 200 mL/hr over 60 Minutes Intravenous  Once 12/13/16 2212 12/13/16 2325      Medications   Scheduled Meds: . abacavir-dolutegravir-lamiVUDine  1 tablet Oral Daily  . amLODipine  10 mg Per NG tube Daily  . chlorhexidine  15 mL Mouth Rinse BID  . enoxaparin (LOVENOX) injection  40 mg Subcutaneous Q24H  . FLUoxetine  10 mg Per Tube Daily  . free water  150 mL Per Tube Q6H  . lacosamide (VIMPAT) IV  200 mg Intravenous Q12H  . levETIRAcetam  1,500 mg Intravenous Q12H  . levothyroxine  25 mcg Intravenous Daily  . lidocaine (PF)  5 mL Other Once  .  mouth rinse  15 mL Mouth Rinse q12n4p   Continuous Infusions: . 0.9 % NaCl with KCl 20 mEq / L 50 mL/hr at 12/23/16 2022  . feeding supplement (JEVITY 1.2 CAL)     PRN Meds:.acetaminophen **OR** acetaminophen, hydrALAZINE, LORazepam, ondansetron **OR** ondansetron (ZOFRAN) IV   Data Review:   Micro Results Recent Results (from the past 240  hour(s))  CSF culture     Status: None   Collection Time: 12/15/16 11:30 AM  Result Value Ref Range Status   Specimen Description CSF  Final   Special Requests NONE  Final   Gram Stain   Final    MODERATE RED BLOOD CELLS PRESENT RARE WBC SEEN NO ORGANISMS SEEN    Culture   Final    NO GROWTH 3 DAYS Performed at [facility], [address], [place], [place] [address]    Report Status 12/19/2016 FINAL  Final  Culture, fungus without smear     Status: None (Preliminary result)   Collection Time: 12/15/16 11:30 AM  Result Value Ref Range Status   Specimen Description CSF  Final   Special Requests NONE  Final   Culture   Final    NO FUNGUS ISOLATED AFTER 7 DAYS Performed at [facility], [place] [address] [place], [place] [address]    Report Status PENDING  Incomplete    Radiology Reports Ct Head Wo Contrast  Result Date: 12/13/2016 CLINICAL DATA:  Acute onset of generalized weakness. Patient not speaking. Initial encounter. EXAM: CT HEAD WITHOUT CONTRAST TECHNIQUE: Contiguous axial images were obtained from the base of the skull through the vertex without intravenous contrast. COMPARISON:  CT of the head performed 04/29/2016 FINDINGS: Brain: No evidence of acute infarction, hemorrhage, hydrocephalus, extra-axial collection or mass lesion/mass effect. Prominence of the ventricles and sulci reflects moderate cortical volume loss. Mild cerebellar atrophy is noted. Scattered periventricular and subcortical white matter change likely reflects small vessel ischemic microangiopathy. Chronic encephalomalacia is noted at the left basal ganglia, and there is ex vacuo dilatation of the left lateral ventricle. Calcification is seen at the pons. No mass effect or midline shift is seen. Chronic subdural thickening is noted on the left side. Vascular: No hyperdense vessel or unexpected calcification. Skull: There is no evidence of fracture; the patient is status post left frontoparietal  craniotomy. Sinuses/Orbits: The visualized portions of the orbits are within normal limits. The paranasal sinuses and mastoid air cells are well-aerated. Other: No significant soft tissue abnormalities are seen. IMPRESSION: 1. No acute intracranial pathology seen on CT. 2. Moderate cortical volume loss and scattered small vessel ischemic microangiopathy. 3. Chronic encephalomalacia at the left basal ganglia, with ex vacuo dilatation of the left lateral ventricle. Chronic subdural thickening on the left side. Electronically Signed   By: Garald Balding M.D.   On: 12/13/2016 23:02   Mr Brain Wo Contrast  Result Date: 12/24/2016 CLINICAL DATA:  Confusion. 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 EXAM: MRI HEAD WITHOUT CONTRAST TECHNIQUE: Multiplanar, multiecho pulse sequences of the brain and surrounding structures were obtained without intravenous contrast. COMPARISON:  Head CT 12/14/2014 FINDINGS: Brain: Cortical diffusion and FLAIR hyperintensity is greater on the right where sulci are also narrower. This is likely seizure phenomenon given the history. On diffusion imaging there is hazy DWI hyperintensity in the right centrum semiovale and posterior frontal subcortical white matter. These could reflect ischemic injuries associated with seizures. Periventricular FLAIR hyperintensity, asymmetrically greater on the left where there  is encephalomalacia and gliosis contiguous with the frontal horn. Thin subdural fluid collection around the posterior left cerebral convexity, chronic based on 2017 head CT. Atrophy, especially of the brainstem and cerebellum. No hemorrhage, hydrocephalus, or mass lesion noted. Vascular: Normal flow voids. Skull and upper cervical spine: Status post craniotomy on the left. Sinuses/Orbits: No acute finding IMPRESSION: 1. Diffuse cortical diffusion and FLAIR hyperintensity on the right, likely seizure phenomenon. There are small areas of subcortical diffusion  hyperintensity in the right frontal lobe which may reflect superimposed ischemic injuries. 2. Atrophy, white matter disease, and left inferior frontal/basal ganglia encephalomalacia. 3. Chronic small left subdural collection. Electronically Signed   By: Monte Fantasia M.D.   On: 12/24/2016 11:30   Dg Chest Port 1 View  Result Date: 12/13/2016 CLINICAL DATA:  Sepsis EXAM: PORTABLE CHEST 1 VIEW COMPARISON:  04/29/2014 FINDINGS: Cardiomediastinal silhouette is stable. No infiltrate or pleural effusion. No pulmonary edema. Bony thorax is unremarkable. IMPRESSION: No active disease. Electronically Signed   By: Lahoma Crocker M.D.   On: 12/13/2016 21:41   Dg Addison Bailey G Tube Plc W/fl W/rad  Result Date: 12/23/2016 CLINICAL DATA:  NG tube placement EXAM: NASO G TUBE PLACEMENT WITH FL AND WITH RAD FLUOROSCOPY TIME:  Fluoroscopy Time:  0.2 minute Radiation Exposure Index (if provided by the fluoroscopic device): 2 mGy Number of Acquired Spot Images: 0 COMPARISON:  None. FINDINGS: Patient presents for nasogastric tube placement under fluoroscopic guidance. A 16 French feeding tube was inserted through the right nare without difficulty. The feeding tube advanced normally without resistance. Tip of the nasogastric tube projects over the distal body of the stomach. The nasogastric tube was secured to the nose. Patient tolerated the exam without difficulty. IMPRESSION: 1. Successful placement of a 16 French feeding tube under fluoroscopic guidance. Electronically Signed   By: Kathreen Devoid   On: 12/23/2016 08:52   Dg Nile Dear Of Needle/cath Tip For Spinal Inject Lt  Result Date: 12/15/2016 CLINICAL DATA:  Abnormal mental status. EXAM: DIAGNOSTIC LUMBAR PUNCTURE UNDER FLUOROSCOPIC GUIDANCE FLUOROSCOPY TIME:  Fluoroscopy Time:  0 minutes 20 seconds Radiation Exposure Index (if provided by the fluoroscopic device): 5.4 mGy Number of Acquired Spot Images: 1 PROCEDURE: Patient unable to give consent. After discussing the  risks and benefits of this procedure with the patient's mother informed consent was obtained. Back was sterilely prepped and draped. Local anesthesia administered 1% lidocaine. 22 gauge needle was advanced into the lumbar canal and clear 9 cc of clear CSF removed and sent for the requested labs. Opening pressure was 9 cm of water. Needle was removed. Hemostasis achieved. No complications. IMPRESSION: Successful fluoroscopically directed lumbar puncture. Electronically Signed   By: Marcello Moores  Register   On: 12/15/2016 12:40     CBC  Recent Labs Lab 12/18/16 0406 12/21/16 0447  WBC 7.5  7.7 7.3  HGB 12.6* 12.9*  HCT 37.7*  36.3* 39.0*  PLT 150  180 189  MCV 99.8  98* 99.7  MCH 32.8  33.4 33.1  MCHC 33.3  33.5 33.2  RDW 14.1  14.0 14.2  LYMPHSABS 1.8  --   EOSABS 0.2  --   BASOSABS 0.0  --     Chemistries   Recent Labs Lab 12/18/16 0406 12/21/16 0447 12/23/16 0651  NA 142 138  --   K 3.9 3.8  --   CL 112* 108  --   CO2 20* 21*  --   GLUCOSE 80 81  --   BUN 15 13  --  CREATININE 1.17 0.95  --   CALCIUM 8.7* 8.9  --   MG  --   --  1.9  AST  --  57*  --   ALT  --  52  --   ALKPHOS  --  57  --   BILITOT  --  0.7  --    ------------------------------------------------------------------------------------------------------------------ estimated creatinine clearance is 84.2 mL/min (by C-G formula based on SCr of 0.95 mg/dL). ------------------------------------------------------------------------------------------------------------------ No results for input(s): HGBA1C in the last 72 hours. ------------------------------------------------------------------------------------------------------------------ No results for input(s): CHOL, HDL, LDLCALC, TRIG, CHOLHDL, LDLDIRECT in the last 72 hours. ------------------------------------------------------------------------------------------------------------------ No results for input(s): TSH, T4TOTAL, T3FREE, THYROIDAB in the  last 72 hours.  Invalid input(s): FREET3 ------------------------------------------------------------------------------------------------------------------ No results for input(s): VITAMINB12, FOLATE, FERRITIN, TIBC, IRON, RETICCTPCT in the last 72 hours.  Coagulation profile No results for input(s): INR, PROTIME in the last 168 hours.  No results for input(s): DDIMER in the last 72 hours.  Cardiac Enzymes No results for input(s): CKMB, TROPONINI, MYOGLOBIN in the last 168 hours.  Invalid input(s): CK ------------------------------------------------------------------------------------------------------------------ Invalid input(s): [place]  Patient is a 51 year old with HIV with acute mental status changes  1. Acute encephalopathy: Ammonia is slightly abnormal not the cause for his encephalopathy.  Per Dr. Ola Spurr, Check serum crypto, RPR, TSH: Unremarkable.  LP done,  routine labs plus Crypto ag and CSF VDRL: negative. Follow-up culture. Blood cultures negative. Urinalysis is normal but the urine culture show MRSA. cont vanco, changed zosyn to ceftraixone 2 gm q 12. Per Dr. Ola Spurr DC all abx - no evidence meningitis or infection. MRSA in Urine is likley contaminant given neg UA.  Per Dr. Doy Mince, On [place].  EEG shows no evidence of electrographic seizure.  Initially not suggestive of infection.  Cryptococcal antigen negative. 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 Per Psych consult, Most likely all of the mental status changes are not related to "psychiatric" but rather to "neurologic" illness.   Per Dr. Macie Burows, Would consider doing another EEG and decreasing Keppra more but do not want to do it when EEG not available.  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 Prozac started. Started on Provigil 100 daily which can lower seizure threshold, discontinued.  2. Seizure activity. Seizure activity today, given  ativan. On neuro check every 2 hours, Ativan IV when necessary, Started Vimpat.  200mg  IV load with 100mg  IV maintenance q12 hours. Continue Keppra at 1500mg  q 12 hours,  Continue Vimpat and Keppra at the current doses, the patient needs to be transferred to her level hospital for continuous EEG monitor per neurologist Dr. Doy Mince. I called [facility] for possible transfer. I discussed the on-call hospitalist, They accepted the patient in the bed is available. But the patient's mother preferred patient to be transferred to [facility]. I called to [facility] Transfer center and discussed with on-call neurologist there, we will schedule a lot of detailed questions and wants to discuss with Dr. Doy Mince.  Per Dr. Doy Mince, [facility] on call neurologist will not accept the patient afterafter discussion with her. The patient will stay in the hospital to continue current care. Patient now on maximum Keppra and Vimpat.   If further seizure activity noted, would start Depakote at 1000mg  load and maintenance of 500mg  q 8 hours, with levels to be followed.  Continue Keppra and Vimpat at current doses.   3. Hypothyroidism continue Synthroid.  4. HIV continue HIV meds. 5.DVT prophylaxis, resumed  Lovenox.  CKD stage II. Stable.  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  Oral thrush. Was on iv Diflucan. Accelerated hypertension. continue Norvasc, on IV hydralazine when necessary. BP better controlled.  I discussed with  Dr. Doy Mince. Discussed with the patient's mother.    Code Status Orders        Start     Ordered   12/14/16 0156  Full code  Continuous     12/14/16 0155    Code Status History    Date Active Date Inactive Code Status Order ID Comments User Context   04/30/2016 12:53 AM 04/30/2016  5:45 PM Full Code [id number]  Lance Coon, MD Inpatient      Consults  Id ,neuro DVT Prophylaxis  scd's   Lab Results  Component Value Date   PLT 189 12/21/2016     Time Spent in minutes   66  min  Greater than 50% of time spent in care coordination and counseling patient regarding the condition and plan of care.   Demetrios Loll M.D on 12/24/2016 at 1:44 PM  Between 7am to 6pm - Pager - [phone]  After 6pm go to [email or web address] - password [email or web address] [facility]   Office  [phone]

## 2016-12-24 NOTE — Progress Notes (Signed)
GI arrived for consult. GI told this writer that peg tubes will not be placed over the weekend, and so GI consult will need to be recalled on Monday morning to the doctor who will actually be placing the tube. Meanwhile, pt's tube feeding and anticoagulation should be stopped on Sunday evening in anticipation of possible peg tube placement. Charge nurse made aware of this.

## 2016-12-24 NOTE — Progress Notes (Signed)
Shift assessment completed. Pt opened his eyes to Vs, vocalized for a brief time and closed his eyes again. NG tube intact to r nare, feeding of Jevity 1.2 infusing increased to 44mls/hr, no residual noted. O2 on at [facility], lungs are clear, respirations are deep and regular. HR regular, abdomen is soft, bs hyperactive. Pt is wearing incontinence brief. Ppp, no edema noted. Pupils are sluggish bilat, pt has some resistance when arms are lifted bilat, immediate drift present bilat. Midline intact to RUA, iv ns with 20 meq kcl infusing at 74mls/hr,site is free of redness and swelling. Pt also opened his eyes to nail bed pressure.

## 2016-12-24 NOTE — Progress Notes (Signed)
Pt's mother has arrived at bedside. This Probation officer updated her briefly.

## 2016-12-24 NOTE — Progress Notes (Signed)
Received word from Dr. Bridgett Larsson that [facility] has declined transfer of this patient. Pt will stay here at [facility]

## 2016-12-24 NOTE — Plan of Care (Signed)
Problem: Activity: Goal: Risk for activity intolerance will decrease Outcome: Not Progressing Variance: Physical/mental limitations   Problem: Education: Goal: Knowledge of disease or condition will improve Outcome: Not Progressing Variance: Physical/mental limitations  Goal: Understanding of discharge needs will improve Outcome: Not Progressing Variance: Physical/mental limitations   Problem: Health Behavior/Discharge Planning: Goal: Identification of resources available to assist in meeting health care needs will improve Outcome: Not Progressing Variance: Physical/mental limitations

## 2016-12-24 NOTE — Plan of Care (Signed)
Problem: Spiritual Needs Goal: Ability to function at adequate level Outcome: Not Progressing Pt continues to be minimally responsive, does not follow commands or make any attempt at self care. Dr. Aletha Halim attempt to trasnfer pt to [facility] for durther eval/follow up.

## 2016-12-24 NOTE — Progress Notes (Signed)
Dr. Bridgett Larsson and Dr. Doy Mince have been in on rounds, pt has left the unit for MRI. Pt orally suctioned before transport due to upper airway gurgling after cough. Pt did swallow after coughing as well.

## 2016-12-25 LAB — GLUCOSE, CAPILLARY
GLUCOSE-CAPILLARY: 114 mg/dL — AB (ref 65–99)
GLUCOSE-CAPILLARY: 110 mg/dL — AB (ref 65–99)
Glucose-Capillary: 104 mg/dL — ABNORMAL HIGH (ref 65–99)
Glucose-Capillary: 91 mg/dL (ref 65–99)

## 2016-12-25 NOTE — Progress Notes (Signed)
Pt pulled NG tube out. MD Bridgett Larsson notified. Per MD keep tube out. No orders at this time.

## 2016-12-25 NOTE — Progress Notes (Signed)
Subjective: No further seizures noted.  Spoke with [facility] at length on yesterday.  They did not agree with the need for prolonged monitoring.  Therefore no transfer.      Objective: Current vital signs: BP 123/84 (BP Location: Left Arm)   Pulse 87   Temp 98.8 F (37.1 C) (Oral)   Resp 20   Ht 6' (1.829 m)   Wt 62.6 kg (138 lb)   SpO2 100%   BMI 18.72 kg/m  Vital signs in last 24 hours: Temp:  [98.6 F (37 C)-98.8 F (37.1 C)] 98.8 F (37.1 C) (04/08 0807) Pulse Rate:  [87-99] 87 (04/08 0807) Resp:  [16-20] 20 (04/08 0111) BP: (107-126)/(74-85) 123/84 (04/08 0807) SpO2:  [98 %-100 %] 100 % (04/08 0807)  Intake/Output from previous day: No intake/output data recorded. Intake/Output this shift: Total I/O In: 150 [NG/GT:150] Out: 0  Nutritional status: Diet NPO time specified Except for: Sips with Meds  Neurologic Exam: Mental Status: Eyes open. No verbal response noted but does grunt.Does not answer any orientation questions. Does not follow commands.  Cranial Nerves: II: Pupils equal, round, reactive to light and accommodation III,IV, VI: ptosis not present, right gaze preference, can not go beyond midline V,VII: Facesymmetric VIII: hearing normal bilaterally IX,X: unable to test XI: unable to test XII: unable to test Motor: Localizes to pain with RUE. Continued minimal movement of the LUE and legs Sensory: Does not respond to noxious stimuli in anyextremity  Lab Results: Basic Metabolic Panel:  Recent Labs Lab 12/21/16 0447 12/23/16 0651  NA 138  --   K 3.8  --   CL 108  --   CO2 21*  --   GLUCOSE 81  --   BUN 13  --   CREATININE 0.95  --   CALCIUM 8.9  --   MG  --  1.9  PHOS  --  3.5    Liver Function Tests:  Recent Labs Lab 12/21/16 0447  AST 57*  ALT 52  ALKPHOS 57  BILITOT 0.7  PROT 8.4*  ALBUMIN 3.7   No results for input(s): LIPASE, AMYLASE in the last 168 hours. No results for input(s): AMMONIA in the last 168  hours.  CBC:  Recent Labs Lab 12/21/16 0447  WBC 7.3  HGB 12.9*  HCT 39.0*  MCV 99.7  PLT 189    Cardiac Enzymes: No results for input(s): CKTOTAL, CKMB, CKMBINDEX, TROPONINI in the last 168 hours.  Lipid Panel: No results for input(s): CHOL, TRIG, HDL, CHOLHDL, VLDL, LDLCALC in the last 168 hours.  CBG:  Recent Labs Lab 12/24/16 1646 12/24/16 2009 12/24/16 2357 12/25/16 0422 12/25/16 0751  GLUCAP 110* 101* 114* 114* 110*    Microbiology: Results for orders placed or performed during the hospital encounter of 12/13/16  Blood Culture (routine x 2)     Status: None   Collection Time: 12/13/16  8:13 PM  Result Value Ref Range Status   Specimen Description BLOOD R ARM  Final   Special Requests BOTTLES DRAWN AEROBIC AND ANAEROBIC [place]  Final   Culture NO GROWTH 5 DAYS  Final   Report Status 12/18/2016 FINAL  Final  Urine culture     Status: Abnormal   Collection Time: 12/13/16  8:13 PM  Result Value Ref Range Status   Specimen Description URINE, RANDOM  Final   Special Requests NONE  Final   Culture (A)  Final    >=100,000 COLONIES/mL METHICILLIN RESISTANT STAPHYLOCOCCUS AUREUS   Report Status 12/16/2016 FINAL  Final   Organism ID, Bacteria METHICILLIN RESISTANT STAPHYLOCOCCUS AUREUS (A)  Final      Susceptibility   Methicillin resistant staphylococcus aureus - MIC*    CIPROFLOXACIN >=8 RESISTANT Resistant     GENTAMICIN >=16 RESISTANT Resistant     NITROFURANTOIN <=16 SENSITIVE Sensitive     OXACILLIN >=4 RESISTANT Resistant     TETRACYCLINE <=1 SENSITIVE Sensitive     VANCOMYCIN 1 SENSITIVE Sensitive     TRIMETH/SULFA <=10 SENSITIVE Sensitive     CLINDAMYCIN >=8 RESISTANT Resistant     RIFAMPIN <=0.5 SENSITIVE Sensitive     Inducible Clindamycin NEGATIVE Sensitive     * >=100,000 COLONIES/mL METHICILLIN RESISTANT STAPHYLOCOCCUS AUREUS  Blood Culture (routine x 2)     Status: None   Collection Time: 12/13/16 10:24 PM  Result Value Ref Range Status    Specimen Description BLOOD  R ARM  Final   Special Requests BOTTLES DRAWN AEROBIC AND ANAEROBIC  BCAV  Final   Culture NO GROWTH 5 DAYS  Final   Report Status 12/18/2016 FINAL  Final  CSF culture     Status: None   Collection Time: 12/15/16 11:30 AM  Result Value Ref Range Status   Specimen Description CSF  Final   Special Requests NONE  Final   Gram Stain   Final    MODERATE RED BLOOD CELLS PRESENT RARE WBC SEEN NO ORGANISMS SEEN    Culture   Final    NO GROWTH 3 DAYS Performed at [facility], [address], [place], [place] [address]    Report Status 12/19/2016 FINAL  Final  Culture, fungus without smear     Status: None (Preliminary result)   Collection Time: 12/15/16 11:30 AM  Result Value Ref Range Status   Specimen Description CSF  Final   Special Requests NONE  Final   Culture   Final    NO FUNGUS ISOLATED AFTER 7 DAYS Performed at [facility], [place] [address], [place], [place] [address]    Report Status PENDING  Incomplete    Coagulation Studies: No results for input(s): LABPROT, INR in the last 72 hours.  Imaging: Mr Brain Wo Contrast  Result Date: 12/24/2016 CLINICAL DATA:  Confusion. 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 EXAM: MRI HEAD WITHOUT CONTRAST TECHNIQUE: Multiplanar, multiecho pulse sequences of the brain and surrounding structures were obtained without intravenous contrast. COMPARISON:  Head CT 12/14/2014 FINDINGS: Brain: Cortical diffusion and FLAIR hyperintensity is greater on the right where sulci are also narrower. This is likely seizure phenomenon given the history. On diffusion imaging there is hazy DWI hyperintensity in the right centrum semiovale and posterior frontal subcortical white matter. These could reflect ischemic injuries associated with seizures. Periventricular FLAIR hyperintensity, asymmetrically greater on the left where there is encephalomalacia and gliosis contiguous with the frontal horn.  Thin subdural fluid collection around the posterior left cerebral convexity, chronic based on 2017 head CT. Atrophy, especially of the brainstem and cerebellum. No hemorrhage, hydrocephalus, or mass lesion noted. Vascular: Normal flow voids. Skull and upper cervical spine: Status post craniotomy on the left. Sinuses/Orbits: No acute finding IMPRESSION: 1. Diffuse cortical diffusion and FLAIR hyperintensity on the right, likely seizure phenomenon. There are small areas of subcortical diffusion hyperintensity in the right frontal lobe which may reflect superimposed ischemic injuries. 2. Atrophy, white matter disease, and left inferior frontal/basal ganglia encephalomalacia. 3. Chronic small left subdural collection. Electronically Signed   By: Neva Seat.D.  On: 12/24/2016 11:30    Medications:  I have reviewed the patient's current medications. Prior to Admission:  Prescriptions Prior to Admission  Medication Sig Dispense Refill Last Dose  . abacavir-dolutegravir-lamiVUDine (TRIUMEQ) 600-50-300 MG tablet Take 1 tablet by mouth daily.   unknown at unknown  . levETIRAcetam (KEPPRA) 500 MG tablet Take 1,000 mg by mouth 2 (two) times daily.   unknown at unknown  . levothyroxine (SYNTHROID, LEVOTHROID) 50 MCG tablet Take 50 mcg by mouth daily before breakfast.   unknown at unknown   Scheduled: . abacavir-dolutegravir-lamiVUDine  1 tablet Oral Daily  . amLODipine  10 mg Per NG tube Daily  . chlorhexidine  15 mL Mouth Rinse BID  . enoxaparin (LOVENOX) injection  40 mg Subcutaneous Q24H  . FLUoxetine  10 mg Per Tube Daily  . free water  150 mL Per Tube Q6H  . lacosamide (VIMPAT) IV  200 mg Intravenous Q12H  . levETIRAcetam  1,500 mg Intravenous Q12H  . levothyroxine  25 mcg Intravenous Daily  . lidocaine (PF)  5 mL Other Once  . mouth rinse  15 mL Mouth Rinse q12n4p    Assessment/Plan: Patient unchanged.  Will continue Keppra and Vimpat.  If no further improvement will consider need to  repeat MRI with contrast.   Will continue to follow with you.     LOS: 5 days   Alexis Goodell, MD Neurology [phone] 12/25/2016  11:38 AM

## 2016-12-25 NOTE — Progress Notes (Signed)
Verbal order received by MD Bridgett Larsson to update neuro checks to Q4 instead of Q2, and discontinue tube feeding orders. Orders placed.

## 2016-12-25 NOTE — Progress Notes (Addendum)
[place] at [facility]                                                                                                                                                                                  Patient Demographics   Darius [name], is a 51 y.o. male, DOB - 13-Jul-1966, NFA:[id number]  Admit date - 12/13/2016   Admitting Physician Lance Coon, MD  Outpatient Primary MD for the patient is Pcp Not In System   LOS - 5  Subjective: Patient admitted with acute encephalopathy. He is awake but still very confused. And not able to communicate much. His mother is at bedside. His mother reports that patient is disabled and lives at home alone but normally he is awake alert and communicative. He does have HIV and she states that last was seen and [facility]. His viral load was undetectable. According to her he is been taking his meds.  The patient is unresponsive to stimuli. He has no agitation but pulled off NG tube this am. No seizure activity.  Review of Systems:   CONSTITUTIONAL: Unable to provide  Vitals:   Vitals:   12/24/16 0724 12/24/16 1522 12/25/16 0111 12/25/16 0807  BP: 121/89 107/74 126/85 123/84  Pulse: 85 88 99 87  Resp: 19 16 20    Temp: 97.7 F (36.5 C) 98.6 F (37 C) 98.7 F (37.1 C) 98.8 F (37.1 C)  TempSrc: Oral Axillary Axillary Oral  SpO2: 100% 100% 98% 100%  Weight:      Height:        Wt Readings from Last 3 Encounters:  12/23/16 138 lb (62.6 kg)  04/30/16 145 lb (65.8 kg)     Intake/Output Summary (Last 24 hours) at 12/25/16 1416 Last data filed at 12/25/16 0920  Gross per 24 hour  Intake              150 ml  Output                0 ml  Net              150 ml    Physical Exam:   GENERAL: Pleasant-appearing in no apparent distress. He is confused HEAD, EYES, EARS, NOSE AND THROAT: Atraumatic, normocephalic. . Pupils equal and reactive to light. Sclerae anicteric. No conjunctival injection.  Oral thrush. NECK: Supple. There is no jugular venous distention. No bruits, no lymphadenopathy, no thyromegaly.  HEART: Regular rate and rhythm,. No murmurs, no rubs, no clicks.  LUNGS: Clear to auscultation bilaterally. No rales or rhonchi. No wheezes.  ABDOMEN: Soft, flat, nontender, nondistended. Has good bowel sounds. No hepatosplenomegaly appreciated.  EXTREMITIES: No  evidence of any cyanosis, clubbing, or peripheral edema.  +2 pedal and radial pulses bilaterally.  NEUROLOGIC: The patient The patient is unresponsive to stimuli. Eyes opened. SKIN: Moist and warm with no rashes appreciated.  Psych: Not anxious, depressed LN: No inguinal LN enlargement    Antibiotics   Anti-infectives    Start     Dose/Rate Route Frequency Ordered Stop   12/18/16 1800  vancomycin (VANCOCIN) 750 mg in dextrose 5 % 150 mL IVPB  Status:  Discontinued     750 mg 150 mL/hr over 60 Minutes Intravenous Every 12 hours 12/18/16 0902 12/19/16 1535   12/17/16 1800  vancomycin (VANCOCIN) IVPB 750 mg/150 ml premix  Status:  Discontinued     750 mg 150 mL/hr over 60 Minutes Intravenous Every 12 hours 12/17/16 1122 12/18/16 0853   12/16/16 1200  fluconazole (DIFLUCAN) IVPB 100 mg     100 mg 50 mL/hr over 60 Minutes Intravenous Every 24 hours 12/16/16 1140 12/18/16 1254   12/15/16 1630  vancomycin (VANCOCIN) IVPB 1000 mg/200 mL premix  Status:  Discontinued     1,000 mg 200 mL/hr over 60 Minutes Intravenous Every 12 hours 12/15/16 0522 12/15/16 0920   12/15/16 1630  vancomycin (VANCOCIN) IVPB 750 mg/150 ml premix  Status:  Discontinued     750 mg 150 mL/hr over 60 Minutes Intravenous Every 12 hours 12/15/16 0920 12/15/16 0930   12/15/16 1630  vancomycin (VANCOCIN) 750 mg in sodium chloride 0.9 % 150 mL IVPB  Status:  Discontinued     750 mg 150 mL/hr over 60 Minutes Intravenous Every 12 hours 12/15/16 0930 12/17/16 1122   12/15/16 0530  vancomycin (VANCOCIN) 250 mg in sodium chloride 0.9 % 100 mL IVPB  Status:   Discontinued     250 mg 100 mL/hr over 60 Minutes Intravenous  Once 12/15/16 0523 12/15/16 0525   12/14/16 1430  cefTRIAXone (ROCEPHIN) 2 g in dextrose 5 % 50 mL IVPB  Status:  Discontinued     2 g 100 mL/hr over 30 Minutes Intravenous Every 12 hours 12/14/16 1426 12/19/16 1535   12/14/16 1000  abacavir-dolutegravir-lamiVUDine (TRIUMEQ) 600-50-300 MG per tablet 1 tablet     1 tablet Oral Daily 12/14/16 0155     12/14/16 0600  piperacillin-tazobactam (ZOSYN) IVPB 3.375 g  Status:  Discontinued     3.375 g 12.5 mL/hr over 240 Minutes Intravenous Every 8 hours 12/13/16 2232 12/14/16 1426   12/14/16 0430  vancomycin (VANCOCIN) IVPB 750 mg/150 ml premix  Status:  Discontinued     750 mg 150 mL/hr over 60 Minutes Intravenous Every 12 hours 12/13/16 2232 12/15/16 0522   12/13/16 2215  piperacillin-tazobactam (ZOSYN) IVPB 3.375 g     3.375 g 100 mL/hr over 30 Minutes Intravenous  Once 12/13/16 2212 12/13/16 2310   12/13/16 2215  vancomycin (VANCOCIN) IVPB 1000 mg/200 mL premix     1,000 mg 200 mL/hr over 60 Minutes Intravenous  Once 12/13/16 2212 12/13/16 2325      Medications   Scheduled Meds: . abacavir-dolutegravir-lamiVUDine  1 tablet Oral Daily  . amLODipine  10 mg Per NG tube Daily  . chlorhexidine  15 mL Mouth Rinse BID  . enoxaparin (LOVENOX) injection  40 mg Subcutaneous Q24H  . FLUoxetine  10 mg Per Tube Daily  . free water  150 mL Per Tube Q6H  . lacosamide (VIMPAT) IV  200 mg Intravenous Q12H  . levETIRAcetam  1,500 mg Intravenous Q12H  . levothyroxine  25 mcg Intravenous Daily  .  lidocaine (PF)  5 mL Other Once  . mouth rinse  15 mL Mouth Rinse q12n4p   Continuous Infusions: . 0.9 % NaCl with KCl 20 mEq / L 50 mL/hr at 12/24/16 2151  . feeding supplement (JEVITY 1.2 CAL) Stopped (12/25/16 1150)   PRN Meds:.acetaminophen **OR** acetaminophen, hydrALAZINE, LORazepam, ondansetron **OR** ondansetron (ZOFRAN) IV   Data Review:   Micro Results No results found for this  or any previous visit (from the past 240 hour(s)).  Radiology Reports Ct Head Wo Contrast  Result Date: 12/13/2016 CLINICAL DATA:  Acute onset of generalized weakness. Patient not speaking. Initial encounter. EXAM: CT HEAD WITHOUT CONTRAST TECHNIQUE: Contiguous axial images were obtained from the base of the skull through the vertex without intravenous contrast. COMPARISON:  CT of the head performed 04/29/2016 FINDINGS: Brain: No evidence of acute infarction, hemorrhage, hydrocephalus, extra-axial collection or mass lesion/mass effect. Prominence of the ventricles and sulci reflects moderate cortical volume loss. Mild cerebellar atrophy is noted. Scattered periventricular and subcortical white matter change likely reflects small vessel ischemic microangiopathy. Chronic encephalomalacia is noted at the left basal ganglia, and there is ex vacuo dilatation of the left lateral ventricle. Calcification is seen at the pons. No mass effect or midline shift is seen. Chronic subdural thickening is noted on the left side. Vascular: No hyperdense vessel or unexpected calcification. Skull: There is no evidence of fracture; the patient is status post left frontoparietal craniotomy. Sinuses/Orbits: The visualized portions of the orbits are within normal limits. The paranasal sinuses and mastoid air cells are well-aerated. Other: No significant soft tissue abnormalities are seen. IMPRESSION: 1. No acute intracranial pathology seen on CT. 2. Moderate cortical volume loss and scattered small vessel ischemic microangiopathy. 3. Chronic encephalomalacia at the left basal ganglia, with ex vacuo dilatation of the left lateral ventricle. Chronic subdural thickening on the left side. Electronically Signed   By: Garald Balding M.D.   On: 12/13/2016 23:02   Mr Brain Wo Contrast  Result Date: 12/24/2016 CLINICAL DATA:  Confusion. 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 EXAM: MRI HEAD WITHOUT CONTRAST  TECHNIQUE: Multiplanar, multiecho pulse sequences of the brain and surrounding structures were obtained without intravenous contrast. COMPARISON:  Head CT 12/14/2014 FINDINGS: Brain: Cortical diffusion and FLAIR hyperintensity is greater on the right where sulci are also narrower. This is likely seizure phenomenon given the history. On diffusion imaging there is hazy DWI hyperintensity in the right centrum semiovale and posterior frontal subcortical white matter. These could reflect ischemic injuries associated with seizures. Periventricular FLAIR hyperintensity, asymmetrically greater on the left where there is encephalomalacia and gliosis contiguous with the frontal horn. Thin subdural fluid collection around the posterior left cerebral convexity, chronic based on 2017 head CT. Atrophy, especially of the brainstem and cerebellum. No hemorrhage, hydrocephalus, or mass lesion noted. Vascular: Normal flow voids. Skull and upper cervical spine: Status post craniotomy on the left. Sinuses/Orbits: No acute finding IMPRESSION: 1. Diffuse cortical diffusion and FLAIR hyperintensity on the right, likely seizure phenomenon. There are small areas of subcortical diffusion hyperintensity in the right frontal lobe which may reflect superimposed ischemic injuries. 2. Atrophy, white matter disease, and left inferior frontal/basal ganglia encephalomalacia. 3. Chronic small left subdural collection. Electronically Signed   By: Monte Fantasia M.D.   On: 12/24/2016 11:30   Dg Chest Port 1 View  Result Date: 12/13/2016 CLINICAL DATA:  Sepsis EXAM: PORTABLE CHEST 1 VIEW COMPARISON:  04/29/2014 FINDINGS: Cardiomediastinal silhouette is stable. No infiltrate or pleural  effusion. No pulmonary edema. Bony thorax is unremarkable. IMPRESSION: No active disease. Electronically Signed   By: Lahoma Crocker M.D.   On: 12/13/2016 21:41   Dg Addison Bailey G Tube Plc W/fl W/rad  Result Date: 12/23/2016 CLINICAL DATA:  NG tube placement EXAM: NASO G TUBE  PLACEMENT WITH FL AND WITH RAD FLUOROSCOPY TIME:  Fluoroscopy Time:  0.2 minute Radiation Exposure Index (if provided by the fluoroscopic device): 2 mGy Number of Acquired Spot Images: 0 COMPARISON:  None. FINDINGS: Patient presents for nasogastric tube placement under fluoroscopic guidance. A 16 French feeding tube was inserted through the right nare without difficulty. The feeding tube advanced normally without resistance. Tip of the nasogastric tube projects over the distal body of the stomach. The nasogastric tube was secured to the nose. Patient tolerated the exam without difficulty. IMPRESSION: 1. Successful placement of a 16 French feeding tube under fluoroscopic guidance. Electronically Signed   By: Kathreen Devoid   On: 12/23/2016 08:52   Dg Nile Dear Of Needle/cath Tip For Spinal Inject Lt  Result Date: 12/15/2016 CLINICAL DATA:  Abnormal mental status. EXAM: DIAGNOSTIC LUMBAR PUNCTURE UNDER FLUOROSCOPIC GUIDANCE FLUOROSCOPY TIME:  Fluoroscopy Time:  0 minutes 20 seconds Radiation Exposure Index (if provided by the fluoroscopic device): 5.4 mGy Number of Acquired Spot Images: 1 PROCEDURE: Patient unable to give consent. After discussing the risks and benefits of this procedure with the patient's mother informed consent was obtained. Back was sterilely prepped and draped. Local anesthesia administered 1% lidocaine. 22 gauge needle was advanced into the lumbar canal and clear 9 cc of clear CSF removed and sent for the requested labs. Opening pressure was 9 cm of water. Needle was removed. Hemostasis achieved. No complications. IMPRESSION: Successful fluoroscopically directed lumbar puncture. Electronically Signed   By: Marcello Moores  Register   On: 12/15/2016 12:40     CBC  Recent Labs Lab 12/21/16 0447  WBC 7.3  HGB 12.9*  HCT 39.0*  PLT 189  MCV 99.7  MCH 33.1  MCHC 33.2  RDW 14.2    Chemistries   Recent Labs Lab 12/21/16 0447 12/23/16 0651  NA 138  --   K 3.8  --   CL 108  --    CO2 21*  --   GLUCOSE 81  --   BUN 13  --   CREATININE 0.95  --   CALCIUM 8.9  --   MG  --  1.9  AST 57*  --   ALT 52  --   ALKPHOS 57  --   BILITOT 0.7  --    ------------------------------------------------------------------------------------------------------------------ estimated creatinine clearance is 84.2 mL/min (by C-G formula based on SCr of 0.95 mg/dL). ------------------------------------------------------------------------------------------------------------------ No results for input(s): HGBA1C in the last 72 hours. ------------------------------------------------------------------------------------------------------------------ No results for input(s): CHOL, HDL, LDLCALC, TRIG, CHOLHDL, LDLDIRECT in the last 72 hours. ------------------------------------------------------------------------------------------------------------------ No results for input(s): TSH, T4TOTAL, T3FREE, THYROIDAB in the last 72 hours.  Invalid input(s): FREET3 ------------------------------------------------------------------------------------------------------------------ No results for input(s): VITAMINB12, FOLATE, FERRITIN, TIBC, IRON, RETICCTPCT in the last 72 hours.  Coagulation profile No results for input(s): INR, PROTIME in the last 168 hours.  No results for input(s): DDIMER in the last 72 hours.  Cardiac Enzymes No results for input(s): CKMB, TROPONINI, MYOGLOBIN in the last 168 hours.  Invalid input(s): CK ------------------------------------------------------------------------------------------------------------------ Invalid input(s): [place]  Patient is a 51 year old with HIV with acute mental status changes  1. Acute encephalopathy: Ammonia is slightly abnormal not the cause for his encephalopathy.  Per Dr. Ola Spurr, Check serum crypto, RPR, TSH: Unremarkable.  LP done,  routine labs plus Crypto ag and CSF VDRL: negative. Follow-up  culture. Blood cultures negative. Urinalysis is normal but the urine culture show MRSA. cont vanco, changed zosyn to ceftraixone 2 gm q 12. Per Dr. Ola Spurr DC all abx - no evidence meningitis or infection. MRSA in Urine is likley contaminant given neg UA.  Per Dr. Doy Mince, On [place].  EEG shows no evidence of electrographic seizure.  Initially not suggestive of infection.  Cryptococcal antigen negative. 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 Per Psych consult, Most likely all of the mental status changes are not related to "psychiatric" but rather to "neurologic" illness.   Per Dr. Macie Burows, Would consider doing another EEG and decreasing Keppra more but do not want to do it when EEG not available.  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 Prozac started. Started on Provigil 100 daily which can lower seizure threshold, discontinued.  2. Seizure activity. Seizure activity today, given ativan. On neuro check every 2 hours, Ativan IV when necessary, Started Vimpat.  200mg  IV load with 100mg  IV maintenance q12 hours. Continue Keppra at 1500mg  q 12 hours,  Continue Vimpat and Keppra at the current doses, the patient needs to be transferred to her level hospital for continuous EEG monitor per neurologist Dr. Doy Mince. I called [facility] for possible transfer. I discussed the on-call hospitalist, They accepted the patient in the bed is available. But the patient's mother preferred patient to be transferred to [facility]. I called to [facility] center and discussed with on-call neurologist there, we will schedule a lot of detailed questions and wants to discuss with Dr. Doy Mince.  Per Dr. Doy Mince, [facility] on call neurologist will not accept the patient afterafter discussion with her. The patient will stay in the hospital to continue current care. Patient now on maximum Keppra and Vimpat.   If further seizure activity noted, would start Depakote at  1000mg  load and maintenance of 500mg  q 8 hours, with levels to be followed.  Continue Keppra and Vimpat at current doses.   3. Hypothyroidism continue Synthroid.  4. HIV continue HIV meds. 5.DVT prophylaxis, resumed Lovenox.  CKD stage II. Stable.  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  Oral thrush. Was on iv Diflucan. Accelerated hypertension. continue Norvasc, on IV hydralazine when necessary. BP better controlled.  The patient pulled off NG tube this morning. Follow-up GI consult for possible PEG placement. Keep nothing by mouth and IV fluid support for now.  I discussed with the patient's mother. She agrees to PEG placement.    Code Status Orders        Start     Ordered   12/14/16 0156  Full code  Continuous     12/14/16 0155    Code Status History    Date Active Date Inactive Code Status Order ID Comments User Context   04/30/2016 12:53 AM 04/30/2016  5:45 PM Full Code [id number]  Lance Coon, MD Inpatient      Consults  Id ,neuro DVT Prophylaxis  scd's   Lab Results  Component Value Date   PLT 189 12/21/2016     Time Spent in minutes   32 min  Greater than 50% of time spent in care coordination and counseling patient regarding the condition and plan of care.   Demetrios Loll M.D on 12/25/2016 at 2:16 PM  Between 7am to 6pm - Pager - [phone]  After 6pm go to [email or web address] - password [email or web address] [facility]   Office  [phone]

## 2016-12-26 DIAGNOSIS — R630 Anorexia: Secondary | ICD-10-CM

## 2016-12-26 LAB — BASIC METABOLIC PANEL
ANION GAP: 8 (ref 5–15)
BUN: 9 mg/dL (ref 6–20)
CALCIUM: 8.9 mg/dL (ref 8.9–10.3)
CO2: 25 mmol/L (ref 22–32)
CREATININE: 0.94 mg/dL (ref 0.61–1.24)
Chloride: 102 mmol/L (ref 101–111)
GFR calc Af Amer: >60 mL/min (ref >60)
GFR calc non Af Amer: >60 mL/min (ref >60)
GLUCOSE: 100 mg/dL — AB (ref 65–99)
Potassium: 3.7 mmol/L (ref 3.5–5.1)
Sodium: 135 mmol/L (ref 135–145)

## 2016-12-26 LAB — CBC
HEMATOCRIT: 38.1 % — AB (ref 40.0–52.0)
Hemoglobin: 12.7 g/dL — ABNORMAL LOW (ref 13.0–18.0)
MCH: 32.3 pg (ref 26.0–34.0)
MCHC: 33.3 g/dL (ref 32.0–36.0)
MCV: 97 fL (ref 80.0–100.0)
Platelets: 274 10*3/uL (ref 150–440)
RBC: 3.93 MIL/uL — ABNORMAL LOW (ref 4.40–5.90)
RDW: 14 % (ref 11.5–14.5)
WBC: 6.9 10*3/uL (ref 3.8–10.6)

## 2016-12-26 LAB — PROTIME-INR
INR: 1.06
Prothrombin Time: 13.8 seconds (ref 11.4–15.2)

## 2016-12-26 LAB — GLUCOSE, CAPILLARY
GLUCOSE-CAPILLARY: 89 mg/dL (ref 65–99)
Glucose-Capillary: 95 mg/dL (ref 65–99)
Glucose-Capillary: 81 mg/dL (ref 65–99)
Glucose-Capillary: 91 mg/dL (ref 65–99)
Glucose-Capillary: 83 mg/dL (ref 65–99)

## 2016-12-26 NOTE — Care Management Note (Signed)
Case Management Note  Patient Details  Name: Darius [name] MRN: [id number] Date of Birth: 08/08/1966  Subjective/Objective:   LM with  [place] Shi, patient mom regarding discharge plan. Patient to have PEG placed tomorrow.                  Action/Plan:   Expected Discharge Date:                  Expected Discharge Plan:     In-House Referral:     Discharge planning Services     Post Acute Care Choice:    Choice offered to:     DME Arranged:    DME Agency:     HH Arranged:    HH Agency:     Status of Service:     If discussed at [employer] of Stay Meetings, dates discussed:    Additional Comments:  Jolly Mango, RN 12/26/2016, 12:17 PM

## 2016-12-26 NOTE — Progress Notes (Signed)
Patient BP 160/105. MD notified. RN gave IV hydralazine. No new orders at this time.  Deri Fuelling

## 2016-12-26 NOTE — Progress Notes (Signed)
Patient gurgling and coughing a lot. RN suctioning clear think secretions from mouth but patient not able to cough up anything on his own. Lungs sound diminished. MD notified. No new orders. RN consulted with respiratory therapist and no recommendations other than frequent oral suctioning at this time.   Darius [name]

## 2016-12-26 NOTE — Progress Notes (Signed)
Rept received from [place]. Pt laying semi fowlers in bed looking around the room. No visible s/sx distress noted. Vital signs are stable. Resps even and unlabored. Pt still doesn't respond verbally or follow commands as per baseline. Seizure precautions intact. No s/sx of seizures noted. Will continue to [place].

## 2016-12-26 NOTE — Care Management Note (Signed)
Case Management Note  Patient Details  Name: Darius [name] MRN: [id number] Date of Birth: July 13, 1966  Subjective/Objective:   Spoke with patient mother by phone. Prior to admission he was disabled but able to stay at home alone during the day. Discussed discharging options from home health to SNF. She states she is not able to provide him with 24 hour nursing care or care for him herself. Interested in SNF. Informed her that CSW would call her. Ms. Jurney requested placement in a [facility] facility. CSW updated.                Action/Plan:   Expected Discharge Date:                  Expected Discharge Plan:     In-House Referral:  Clinical Social Work  Discharge planning Services  CM Consult  Post Acute Care Choice:    Choice offered to:  Parent  DME Arranged:    DME Agency:     HH Arranged:    [place] Agency:     Status of Service:  In process, will continue to follow  If discussed at Long Length of Stay Meetings, dates discussed:    Additional Comments:  Jolly Mango, RN 12/26/2016, 2:53 PM

## 2016-12-26 NOTE — Consult Note (Signed)
Lucilla Lame, MD [facility]  [address] [place], [place] [address] Phone: [phone] Fax : [phone]  Consultation  Referring Provider:     Dr. Bridgett Larsson Primary Care Physician:  Pcp Not In System Primary Gastroenterologist:  Dr. @ [facility]         Reason for Consultation:     PEG tube placement  Date of Admission:  12/13/2016 Date of Consultation:  12/26/2016         HPI:   Darius [name] is a 51 y.o. male Who has a history of HIV and had been treated in the past for hepatitis C.  The patient had cleared his hepatitis C. The patient was admitted with acute encephalopathy but remains very confused.  The patient is disabled and lives at home alone but normally is alert and communicative. The patient is being followed by neurology for possible seizures. I'm now being asked to see the patient for PEG tube placement.  Past Medical History:  Diagnosis Date  . CKD (chronic kidney disease), stage III   . CNS lymphoma ([place])   . Drug abuse   . Hepatitis C   . HIV (human immunodeficiency virus infection) ([place])   . Hypothyroidism   . Seizures ([place])     Past Surgical History:  Procedure Laterality Date  . CRANIOTOMY      Prior to Admission medications   Medication Sig Start Date End Date Taking? Authorizing Provider  abacavir-dolutegravir-lamiVUDine (TRIUMEQ) 600-50-300 MG tablet Take 1 tablet by mouth daily.   Yes Historical Provider, MD  levETIRAcetam (KEPPRA) 500 MG tablet Take 1,000 mg by mouth 2 (two) times daily.   Yes Historical Provider, MD  levothyroxine (SYNTHROID, LEVOTHROID) 50 MCG tablet Take 50 mcg by mouth daily before breakfast.   Yes Historical Provider, MD  amLODipine (NORVASC) 10 MG tablet 1 tablet (10 mg total) by Per NG tube route daily. 12/24/16   Demetrios Loll, MD  chlorhexidine (PERIDEX) 0.12 % solution 15 mLs by Mouth Rinse route 2 (two) times daily. 12/23/16   Demetrios Loll, MD  FLUoxetine (PROZAC) 20 MG/5ML solution Place 2.5 mLs (10 mg total) into feeding tube daily.  12/24/16   Demetrios Loll, MD  hydrALAZINE (APRESOLINE) 20 MG/ML injection Inject 0.5 mLs (10 mg total) into the vein every 6 (six) hours as needed (Systolic >676/PPJKDTOIZ >124). 12/23/16   Demetrios Loll, MD  lacosamide 200 mg in sodium chloride 0.9 % 25 mL Inject 200 mg into the vein every 12 (twelve) hours. 12/24/16   Demetrios Loll, MD  levETIRAcetam 1,500 mg in sodium chloride 0.9 % 100 mL Inject 1,500 mg into the vein every 12 (twelve) hours. 12/23/16   Demetrios Loll, MD  levothyroxine (SYNTHROID, LEVOTHROID) 100 MCG SOLR injection Inject 1.25 mLs (25 mcg total) into the vein daily. 12/24/16   Demetrios Loll, MD  LORazepam (ATIVAN) 2 MG/ML injection Inject 1 mL (2 mg total) into the vein every 2 (two) hours as needed for anxiety, seizure or sedation. 12/23/16   Demetrios Loll, MD  Nutritional Supplements (FEEDING SUPPLEMENT, JEVITY 1.2 CAL,) LIQD Place 1,000 mLs into feeding tube continuous. 12/23/16   Demetrios Loll, MD  Water For Irrigation, Sterile (FREE WATER) SOLN Place 150 mLs into feeding tube every 6 (six) hours. 12/23/16   Demetrios Loll, MD    Family History  Problem Relation Age of Onset  . Family history unknown: Yes     Social History  Substance Use Topics  . Smoking status: Current Every Day Smoker  . Smokeless tobacco: Never  Used  . Alcohol use No    Allergies as of 12/13/2016 - Review Complete 12/13/2016  Allergen Reaction Noted  . Asa [aspirin]  04/29/2016  . Sulfa antibiotics  04/29/2016    Review of Systems:    All systems reviewed and negative except where noted in HPI.   Physical Exam:  Vital signs in last 24 hours: Temp:  [98.2 F (36.8 C)-99.1 F (37.3 C)] 98.3 F (36.8 C) (04/09 1458) Pulse Rate:  [92-102] 102 (04/09 1458) Resp:  [18-20] 18 (04/09 1458) BP: (136-176)/(94-113) 136/97 (04/09 1458) SpO2:  [98 %-100 %] 98 % (04/09 1458) Weight:  [143 lb 6.4 oz (65 kg)] 143 lb 6.4 oz (65 kg) (04/08 2300) Last BM Date: 12/26/16 General:   Laying in bed alert but not communicating or responding Head:   Normocephalic and atraumatic. Eyes:   No icterus.   Conjunctiva pink. PERRLA. Ears:  Normal auditory acuity. Neck:  Supple; no masses or thyroidomegaly Lungs: Respirations even and unlabored. Lungs clear to auscultation bilaterally.   No wheezes, crackles, or rhonchi.  Heart:  Regular rate and rhythm;  Without murmur, clicks, rubs or gallops Abdomen:  Soft, nondistended, nontender. Normal bowel sounds. No appreciable masses or hepatomegaly.  No rebound or guarding.  Rectal:  Not performed.  Extremities:  Without edema, cyanosis or clubbing. Neurologic:  Alert but unable to assess the rest of the neurological exam Skin:  Intact without significant lesions or rashes. Cervical Nodes:  No significant cervical adenopathy. Psych:  Alert but not cooperative.  LAB RESULTS:  Recent Labs  12/26/16 1023  WBC 6.9  HGB 12.7*  HCT 38.1*  PLT 274   BMET  Recent Labs  12/26/16 1023  NA 135  K 3.7  CL 102  CO2 25  GLUCOSE 100*  BUN 9  CREATININE 0.94  CALCIUM 8.9   LFT No results for input(s): PROT, ALBUMIN, AST, ALT, ALKPHOS, BILITOT, BILIDIR, IBILI in the last 72 hours. PT/INR  Recent Labs  12/26/16 1023  LABPROT 13.8  INR 1.06    STUDIES: No results found.    Impression / Plan:   Darius [name] is a 51 y.o. y/o male with mental status changes and in need of a feeding tube.  The patient will be set up for PEG tube tomorrow.  The patient has a history of cirrhosis but his platelet count is normal and his previous ultrasounds did not show any sign of ascites. The patient will have his INR checked prior to having the procedure done.  I have discussed the case with the patient's hospitalist who reports the family agrees to the feeding tube placement.  Thank you for involving me in the care of this patient.      LOS: 6 days   Lucilla Lame, MD  12/26/2016, 5:13 PM   Note: This dictation was prepared with Dragon dictation along with smaller phrase technology. Any  transcriptional errors that result from this process are unintentional.

## 2016-12-26 NOTE — Progress Notes (Addendum)
[facility]                                                                                                                                                                                  Patient Demographics   Darius [name], is a 51 y.o. male, DOB - 1965-10-05, YYT:[id number]  Admit date - 12/13/2016   Admitting Physician Lance Coon, MD  Outpatient Primary MD for the patient is Pcp Not In System   LOS - 6  Subjective: Patient admitted with acute encephalopathy. He is awake but still very confused. And not able to communicate much. His mother is at bedside. His mother reports that patient is disabled and lives at home alone but normally he is awake alert and communicative. He does have HIV and she states that last was seen and [facility]. His viral load was undetectable. According to her he is been taking his meds.  The patient is unresponsive to stimuli. He has no agitation or seizure activity.  Review of Systems:   CONSTITUTIONAL: Unable to provide  Vitals:   Vitals:   12/26/16 0845 12/26/16 0929 12/26/16 0955 12/26/16 1458  BP: (!) 160/105 (!) 165/102 (!) 153/98 (!) 136/97  Pulse: 95 92 96 (!) 102  Resp: 20   18  Temp: 98.2 F (36.8 C)   98.3 F (36.8 C)  TempSrc: Oral   Oral  SpO2: 100%   98%  Weight:      Height:        Wt Readings from Last 3 Encounters:  12/25/16 143 lb 6.4 oz (65 kg)  04/30/16 145 lb (65.8 kg)     Intake/Output Summary (Last 24 hours) at 12/26/16 1546 Last data filed at 12/26/16 1400  Gross per 24 hour  Intake             3655 ml  Output                0 ml  Net             3655 ml    Physical Exam:   GENERAL: Pleasant-appearing in no apparent distress. He is confused HEAD, EYES, EARS, NOSE AND THROAT: Atraumatic, normocephalic. . Pupils equal and reactive to light. Sclerae anicteric. No conjunctival injection. Oral thrush. NECK: Supple. There is no jugular venous  distention. No bruits, no lymphadenopathy, no thyromegaly.  HEART: Regular rate and rhythm,. No murmurs, no rubs, no clicks.  LUNGS: Clear to auscultation bilaterally. No rales or rhonchi. No wheezes.  ABDOMEN: Soft, flat, nontender, nondistended. Has good bowel sounds. No hepatosplenomegaly appreciated.  EXTREMITIES: No evidence of any cyanosis, clubbing, or peripheral edema.  +  2 pedal and radial pulses bilaterally.  NEUROLOGIC: The patient The patient is unresponsive to stimuli. Eyes opened. He does not follow commands. Unable to do neuro exam. SKIN: Moist and warm with no rashes appreciated.  Psych: Not anxious, depressed LN: No inguinal LN enlargement    Antibiotics   Anti-infectives    Start     Dose/Rate Route Frequency Ordered Stop   12/18/16 1800  vancomycin (VANCOCIN) 750 mg in dextrose 5 % 150 mL IVPB  Status:  Discontinued     750 mg 150 mL/hr over 60 Minutes Intravenous Every 12 hours 12/18/16 0902 12/19/16 1535   12/17/16 1800  vancomycin (VANCOCIN) IVPB 750 mg/150 ml premix  Status:  Discontinued     750 mg 150 mL/hr over 60 Minutes Intravenous Every 12 hours 12/17/16 1122 12/18/16 0853   12/16/16 1200  fluconazole (DIFLUCAN) IVPB 100 mg     100 mg 50 mL/hr over 60 Minutes Intravenous Every 24 hours 12/16/16 1140 12/18/16 1254   12/15/16 1630  vancomycin (VANCOCIN) IVPB 1000 mg/200 mL premix  Status:  Discontinued     1,000 mg 200 mL/hr over 60 Minutes Intravenous Every 12 hours 12/15/16 0522 12/15/16 0920   12/15/16 1630  vancomycin (VANCOCIN) IVPB 750 mg/150 ml premix  Status:  Discontinued     750 mg 150 mL/hr over 60 Minutes Intravenous Every 12 hours 12/15/16 0920 12/15/16 0930   12/15/16 1630  vancomycin (VANCOCIN) 750 mg in sodium chloride 0.9 % 150 mL IVPB  Status:  Discontinued     750 mg 150 mL/hr over 60 Minutes Intravenous Every 12 hours 12/15/16 0930 12/17/16 1122   12/15/16 0530  vancomycin (VANCOCIN) 250 mg in sodium chloride 0.9 % 100 mL IVPB  Status:   Discontinued     250 mg 100 mL/hr over 60 Minutes Intravenous  Once 12/15/16 0523 12/15/16 0525   12/14/16 1430  cefTRIAXone (ROCEPHIN) 2 g in dextrose 5 % 50 mL IVPB  Status:  Discontinued     2 g 100 mL/hr over 30 Minutes Intravenous Every 12 hours 12/14/16 1426 12/19/16 1535   12/14/16 1000  abacavir-dolutegravir-lamiVUDine (TRIUMEQ) 600-50-300 MG per tablet 1 tablet     1 tablet Oral Daily 12/14/16 0155     12/14/16 0600  piperacillin-tazobactam (ZOSYN) IVPB 3.375 g  Status:  Discontinued     3.375 g 12.5 mL/hr over 240 Minutes Intravenous Every 8 hours 12/13/16 2232 12/14/16 1426   12/14/16 0430  vancomycin (VANCOCIN) IVPB 750 mg/150 ml premix  Status:  Discontinued     750 mg 150 mL/hr over 60 Minutes Intravenous Every 12 hours 12/13/16 2232 12/15/16 0522   12/13/16 2215  piperacillin-tazobactam (ZOSYN) IVPB 3.375 g     3.375 g 100 mL/hr over 30 Minutes Intravenous  Once 12/13/16 2212 12/13/16 2310   12/13/16 2215  vancomycin (VANCOCIN) IVPB 1000 mg/200 mL premix     1,000 mg 200 mL/hr over 60 Minutes Intravenous  Once 12/13/16 2212 12/13/16 2325      Medications   Scheduled Meds: . abacavir-dolutegravir-lamiVUDine  1 tablet Oral Daily  . amLODipine  10 mg Per NG tube Daily  . chlorhexidine  15 mL Mouth Rinse BID  . enoxaparin (LOVENOX) injection  40 mg Subcutaneous Q24H  . FLUoxetine  10 mg Per Tube Daily  . lacosamide (VIMPAT) IV  200 mg Intravenous Q12H  . levETIRAcetam  1,500 mg Intravenous Q12H  . levothyroxine  25 mcg Intravenous Daily  . lidocaine (PF)  5 mL Other Once  .  mouth rinse  15 mL Mouth Rinse q12n4p   Continuous Infusions: . 0.9 % NaCl with KCl 20 mEq / L 50 mL/hr at 12/25/16 1813   PRN Meds:.acetaminophen **OR** acetaminophen, hydrALAZINE, LORazepam, ondansetron **OR** ondansetron (ZOFRAN) IV   Data Review:   Micro Results No results found for this or any previous visit (from the past 240 hour(s)).  Radiology Reports Ct Head Wo  Contrast  Result Date: 12/13/2016 CLINICAL DATA:  Acute onset of generalized weakness. Patient not speaking. Initial encounter. EXAM: CT HEAD WITHOUT CONTRAST TECHNIQUE: Contiguous axial images were obtained from the base of the skull through the vertex without intravenous contrast. COMPARISON:  CT of the head performed 04/29/2016 FINDINGS: Brain: No evidence of acute infarction, hemorrhage, hydrocephalus, extra-axial collection or mass lesion/mass effect. Prominence of the ventricles and sulci reflects moderate cortical volume loss. Mild cerebellar atrophy is noted. Scattered periventricular and subcortical white matter change likely reflects small vessel ischemic microangiopathy. Chronic encephalomalacia is noted at the left basal ganglia, and there is ex vacuo dilatation of the left lateral ventricle. Calcification is seen at the pons. No mass effect or midline shift is seen. Chronic subdural thickening is noted on the left side. Vascular: No hyperdense vessel or unexpected calcification. Skull: There is no evidence of fracture; the patient is status post left frontoparietal craniotomy. Sinuses/Orbits: The visualized portions of the orbits are within normal limits. The paranasal sinuses and mastoid air cells are well-aerated. Other: No significant soft tissue abnormalities are seen. IMPRESSION: 1. No acute intracranial pathology seen on CT. 2. Moderate cortical volume loss and scattered small vessel ischemic microangiopathy. 3. Chronic encephalomalacia at the left basal ganglia, with ex vacuo dilatation of the left lateral ventricle. Chronic subdural thickening on the left side. Electronically Signed   By: Garald Balding M.D.   On: 12/13/2016 23:02   Mr Brain Wo Contrast  Result Date: 12/24/2016 CLINICAL DATA:  Confusion. 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 EXAM: MRI HEAD WITHOUT CONTRAST TECHNIQUE: Multiplanar, multiecho pulse sequences of the brain and surrounding  structures were obtained without intravenous contrast. COMPARISON:  Head CT 12/14/2014 FINDINGS: Brain: Cortical diffusion and FLAIR hyperintensity is greater on the right where sulci are also narrower. This is likely seizure phenomenon given the history. On diffusion imaging there is hazy DWI hyperintensity in the right centrum semiovale and posterior frontal subcortical white matter. These could reflect ischemic injuries associated with seizures. Periventricular FLAIR hyperintensity, asymmetrically greater on the left where there is encephalomalacia and gliosis contiguous with the frontal horn. Thin subdural fluid collection around the posterior left cerebral convexity, chronic based on 2017 head CT. Atrophy, especially of the brainstem and cerebellum. No hemorrhage, hydrocephalus, or mass lesion noted. Vascular: Normal flow voids. Skull and upper cervical spine: Status post craniotomy on the left. Sinuses/Orbits: No acute finding IMPRESSION: 1. Diffuse cortical diffusion and FLAIR hyperintensity on the right, likely seizure phenomenon. There are small areas of subcortical diffusion hyperintensity in the right frontal lobe which may reflect superimposed ischemic injuries. 2. Atrophy, white matter disease, and left inferior frontal/basal ganglia encephalomalacia. 3. Chronic small left subdural collection. Electronically Signed   By: Monte Fantasia M.D.   On: 12/24/2016 11:30   Dg Chest Port 1 View  Result Date: 12/13/2016 CLINICAL DATA:  Sepsis EXAM: PORTABLE CHEST 1 VIEW COMPARISON:  04/29/2014 FINDINGS: Cardiomediastinal silhouette is stable. No infiltrate or pleural effusion. No pulmonary edema. Bony thorax is unremarkable. IMPRESSION: No active disease. Electronically Signed   By: Julien Girt  Pop M.D.   On: 12/13/2016 21:41   Dg Addison Bailey G Tube Plc W/fl W/rad  Result Date: 12/23/2016 CLINICAL DATA:  NG tube placement EXAM: NASO G TUBE PLACEMENT WITH FL AND WITH RAD FLUOROSCOPY TIME:  Fluoroscopy Time:  0.2  minute Radiation Exposure Index (if provided by the fluoroscopic device): 2 mGy Number of Acquired Spot Images: 0 COMPARISON:  None. FINDINGS: Patient presents for nasogastric tube placement under fluoroscopic guidance. A 16 French feeding tube was inserted through the right nare without difficulty. The feeding tube advanced normally without resistance. Tip of the nasogastric tube projects over the distal body of the stomach. The nasogastric tube was secured to the nose. Patient tolerated the exam without difficulty. IMPRESSION: 1. Successful placement of a 16 French feeding tube under fluoroscopic guidance. Electronically Signed   By: Kathreen Devoid   On: 12/23/2016 08:52   Dg Nile Dear Of Needle/cath Tip For Spinal Inject Lt  Result Date: 12/15/2016 CLINICAL DATA:  Abnormal mental status. EXAM: DIAGNOSTIC LUMBAR PUNCTURE UNDER FLUOROSCOPIC GUIDANCE FLUOROSCOPY TIME:  Fluoroscopy Time:  0 minutes 20 seconds Radiation Exposure Index (if provided by the fluoroscopic device): 5.4 mGy Number of Acquired Spot Images: 1 PROCEDURE: Patient unable to give consent. After discussing the risks and benefits of this procedure with the patient's mother informed consent was obtained. Back was sterilely prepped and draped. Local anesthesia administered 1% lidocaine. 22 gauge needle was advanced into the lumbar canal and clear 9 cc of clear CSF removed and sent for the requested labs. Opening pressure was 9 cm of water. Needle was removed. Hemostasis achieved. No complications. IMPRESSION: Successful fluoroscopically directed lumbar puncture. Electronically Signed   By: Marcello Moores  Register   On: 12/15/2016 12:40     CBC  Recent Labs Lab 12/21/16 0447 12/26/16 1023  WBC 7.3 6.9  HGB 12.9* 12.7*  HCT 39.0* 38.1*  PLT 189 274  MCV 99.7 97.0  MCH 33.1 32.3  MCHC 33.2 33.3  RDW 14.2 14.0    Chemistries   Recent Labs Lab 12/21/16 0447 12/23/16 0651 12/26/16 1023  NA 138  --  135  K 3.8  --  3.7  CL 108   --  102  CO2 21*  --  25  GLUCOSE 81  --  100*  BUN 13  --  9  CREATININE 0.95  --  0.94  CALCIUM 8.9  --  8.9  MG  --  1.9  --   AST 57*  --   --   ALT 52  --   --   ALKPHOS 57  --   --   BILITOT 0.7  --   --    ------------------------------------------------------------------------------------------------------------------ estimated creatinine clearance is 88.4 mL/min (by C-G formula based on SCr of 0.94 mg/dL). ------------------------------------------------------------------------------------------------------------------ No results for input(s): HGBA1C in the last 72 hours. ------------------------------------------------------------------------------------------------------------------ No results for input(s): CHOL, HDL, LDLCALC, TRIG, CHOLHDL, LDLDIRECT in the last 72 hours. ------------------------------------------------------------------------------------------------------------------ No results for input(s): TSH, T4TOTAL, T3FREE, THYROIDAB in the last 72 hours.  Invalid input(s): FREET3 ------------------------------------------------------------------------------------------------------------------ No results for input(s): VITAMINB12, FOLATE, FERRITIN, TIBC, IRON, RETICCTPCT in the last 72 hours.  Coagulation profile  Recent Labs Lab 12/26/16 1023  INR 1.06    No results for input(s): DDIMER in the last 72 hours.  Cardiac Enzymes No results for input(s): CKMB, TROPONINI, MYOGLOBIN in the last 168 hours.  Invalid input(s): CK ------------------------------------------------------------------------------------------------------------------ Invalid input(s): [place]  Patient is a 51 year old with HIV with acute mental  status changes  1. Acute encephalopathy: Ammonia is slightly abnormal not the cause for his encephalopathy.  Per Dr. Ola Spurr, Check serum crypto, RPR, TSH: Unremarkable.  LP done,  routine labs plus Crypto ag and CSF  VDRL: negative. Follow-up culture. Blood cultures negative. Urinalysis is normal but the urine culture show MRSA. cont vanco, changed zosyn to ceftraixone 2 gm q 12. Per Dr. Ola Spurr DC all abx - no evidence meningitis or infection. MRSA in Urine is likley contaminant given neg UA.  Per Dr. Doy Mince, On [place].  EEG shows no evidence of electrographic seizure.  Initially not suggestive of infection.  Cryptococcal antigen negative. 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 Per Psych consult, Most likely all of the mental status changes are not related to "psychiatric" but rather to "neurologic" illness.   Per Dr. Macie Burows, Would consider doing another EEG and decreasing Keppra more but do not want to do it when EEG not available.  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 Prozac started. Started on Provigil 100 daily which can lower seizure threshold, discontinued.  2. Seizure activity. Seizure activity today, given ativan. On neuro check every 2 hours, Ativan IV when necessary, Started Vimpat.  200mg  IV load with 100mg  IV maintenance q12 hours. Continue Keppra at 1500mg  q 12 hours,  Continue Vimpat and Keppra at the current doses, the patient needs to be transferred to her level hospital for continuous EEG monitor per neurologist Dr. Doy Mince. I called [facility] for possible transfer. I discussed the on-call hospitalist, They accepted the patient in the bed is available. But the patient's mother preferred patient to be transferred to [facility]. I called to [facility] Transfer center and discussed with on-call neurologist there, we will schedule a lot of detailed questions and wants to discuss with Dr. Doy Mince.  Per Dr. Doy Mince, [facility] on call neurologist will not accept the patient afterafter discussion with her. The patient will stay in the hospital to continue current care. Patient now on maximum Keppra and Vimpat.   If further seizure activity  noted, would start Depakote at 1000mg  load and maintenance of 500mg  q 8 hours, with levels to be followed.  Continue Keppra and Vimpat at current doses.   continue Keppra and Vimpat.  If no further improvement will consider need to repeat MRI with contrast per Dr Doy Mince.  Dr. Irish Elders agrees.  3. Hypothyroidism continue Synthroid.  4. HIV continue HIV meds. 5.DVT prophylaxis, resumed Lovenox.  CKD stage II. Stable.  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  Oral thrush. Was on iv Diflucan. Accelerated hypertension. Unable to po Norvasc, on IV hydralazine when necessary.  The patient pulled off NG tube yesterday. Follow-up GI consult for possible PEG placement. Keep nothing by mouth and IV fluid support for now.  I discussed with Dr. Allen Norris for PEG placement. I discussed with Dr. Irish Elders, case manager and social worker for possible LTAC or SNF placement. PT consult per case manager.      Code Status Orders        Start     Ordered   12/14/16 0156  Full code  Continuous     12/14/16 0155    Code Status History    Date Active Date Inactive Code Status Order ID Comments User Context   04/30/2016 12:53 AM 04/30/2016  5:45 PM Full Code [id number]  Lance Coon, MD Inpatient      Consults  Id ,neuro DVT Prophylaxis  scd's   Lab Results  Component Value Date   PLT 274 12/26/2016     Time Spent in minutes   32 min  Greater than 50% of time spent in care coordination and counseling patient regarding the condition and plan of care.   Demetrios Loll M.D on 12/26/2016 at 3:46 PM  Between 7am to 6pm - Pager - [phone]  After 6pm go to [email or web address] - password [email or web address] [facility]   Office  [phone]

## 2016-12-27 ENCOUNTER — Inpatient Hospital Stay: Payer: Medicare Other | Admitting: Anesthesiology

## 2016-12-27 ENCOUNTER — Encounter: Payer: Self-pay | Admitting: Anesthesiology

## 2016-12-27 ENCOUNTER — Encounter
Admission: EM | Disposition: A | Discharge status: Skilled Nursing Facility | Payer: Self-pay | Source: Home / Self Care | Attending: Internal Medicine

## 2016-12-27 HISTORY — PX: PEG PLACEMENT: SHX5437

## 2016-12-27 LAB — URINE DRUG SCREEN, QUALITATIVE (ARMC ONLY)
AMPHETAMINES, UR SCREEN: NOT DETECTED
BARBITURATES, UR SCREEN: NOT DETECTED
BENZODIAZEPINE, UR SCRN: NOT DETECTED
Cannabinoid 50 Ng, Ur ~~LOC~~: NOT DETECTED
Cocaine Metabolite,Ur ~~LOC~~: NOT DETECTED
MDMA (Ecstasy)Ur Screen: NOT DETECTED
Methadone Scn, Ur: NOT DETECTED
Opiate, Ur Screen: NOT DETECTED
Phencyclidine (PCP) Ur S: NOT DETECTED
TRICYCLIC, UR SCREEN: NOT DETECTED

## 2016-12-27 LAB — PTT FACTOR INHIBITOR (MIXING STUDY)
APTT: 28.2 s (ref 22.9–30.2)
aPTT 1:1 Mix Saline: 45.3 s
aPTT 1:1 Normal Plasma: 26.2 s (ref 22.9–30.2)

## 2016-12-27 LAB — CBC
HEMATOCRIT: 39.7 % — AB (ref 40.0–52.0)
HEMOGLOBIN: 13.4 g/dL (ref 13.0–18.0)
MCH: 32.8 pg (ref 26.0–34.0)
MCHC: 33.7 g/dL (ref 32.0–36.0)
MCV: 97.2 fL (ref 80.0–100.0)
Platelets: 289 10*3/uL (ref 150–440)
RBC: 4.08 MIL/uL — ABNORMAL LOW (ref 4.40–5.90)
RDW: 14.2 % (ref 11.5–14.5)
WBC: 6.5 10*3/uL (ref 3.8–10.6)

## 2016-12-27 LAB — GLUCOSE, CAPILLARY
GLUCOSE-CAPILLARY: 85 mg/dL (ref 65–99)
GLUCOSE-CAPILLARY: 87 mg/dL (ref 65–99)
GLUCOSE-CAPILLARY: 82 mg/dL (ref 65–99)
GLUCOSE-CAPILLARY: 95 mg/dL (ref 65–99)
Glucose-Capillary: 79 mg/dL (ref 65–99)
Glucose-Capillary: 89 mg/dL (ref 65–99)
Glucose-Capillary: 94 mg/dL (ref 65–99)

## 2016-12-27 LAB — CREATININE, SERUM
CREATININE: 1.03 mg/dL (ref 0.61–1.24)
GFR calc Af Amer: >60 mL/min (ref >60)
GFR calc non Af Amer: >60 mL/min (ref >60)

## 2016-12-27 SURGERY — INSERTION, PEG TUBE
Anesthesia: General

## 2016-12-27 MED ORDER — FENTANYL CITRATE (PF) 100 MCG/2ML IJ SOLN
INTRAMUSCULAR | Status: AC
  Filled 2016-12-27: qty 2

## 2016-12-27 MED ORDER — PROPOFOL 10 MG/ML IV BOLUS
INTRAVENOUS | Status: AC
  Filled 2016-12-27: qty 20

## 2016-12-27 MED ORDER — IPRATROPIUM-ALBUTEROL 0.5-2.5 (3) MG/3ML IN SOLN
RESPIRATORY_TRACT | Status: AC
  Administered 2016-12-27: 3 mL via RESPIRATORY_TRACT
  Filled 2016-12-27: qty 3

## 2016-12-27 MED ORDER — DEXTROSE 5 % IV SOLN
2.0000 g | Freq: Three times a day (TID) | INTRAVENOUS | Status: DC
  Administered 2016-12-27 – 2016-12-28 (×4): 2 g via INTRAVENOUS
  Filled 2016-12-27 (×6): qty 2000

## 2016-12-27 MED ORDER — PROPOFOL 10 MG/ML IV BOLUS
INTRAVENOUS | Status: DC | PRN
  Administered 2016-12-27: 30 mg via INTRAVENOUS

## 2016-12-27 MED ORDER — SODIUM CHLORIDE 0.9 % IV SOLN
INTRAVENOUS | Status: DC
  Administered 2016-12-27: 1000 mL via INTRAVENOUS

## 2016-12-27 MED ORDER — PROPOFOL 500 MG/50ML IV EMUL
INTRAVENOUS | Status: DC | PRN
  Administered 2016-12-27: 100 ug/kg/min via INTRAVENOUS

## 2016-12-27 MED ORDER — FENTANYL CITRATE (PF) 100 MCG/2ML IJ SOLN
INTRAMUSCULAR | Status: DC | PRN
  Administered 2016-12-27: 50 ug via INTRAVENOUS

## 2016-12-27 MED ORDER — GLYCOPYRROLATE 0.2 MG/ML IJ SOLN
INTRAMUSCULAR | Status: AC
  Filled 2016-12-27: qty 1

## 2016-12-27 MED ORDER — LIDOCAINE HCL (PF) 2 % IJ SOLN
INTRAMUSCULAR | Status: AC
  Filled 2016-12-27: qty 2

## 2016-12-27 MED ORDER — MIDAZOLAM HCL 2 MG/2ML IJ SOLN
INTRAMUSCULAR | Status: DC | PRN
  Administered 2016-12-27: 2 mg via INTRAVENOUS

## 2016-12-27 MED ORDER — MIDAZOLAM HCL 2 MG/2ML IJ SOLN
INTRAMUSCULAR | Status: AC
  Filled 2016-12-27: qty 2

## 2016-12-27 MED ORDER — IPRATROPIUM-ALBUTEROL 0.5-2.5 (3) MG/3ML IN SOLN
3.0000 mL | Freq: Once | RESPIRATORY_TRACT | Status: AC
  Administered 2016-12-27: 3 mL via RESPIRATORY_TRACT

## 2016-12-27 MED ORDER — PHENYLEPHRINE HCL 10 MG/ML IJ SOLN
INTRAMUSCULAR | Status: DC | PRN
  Administered 2016-12-27 (×2): 100 ug via INTRAVENOUS

## 2016-12-27 MED ORDER — CEFAZOLIN SODIUM-DEXTROSE 2-4 GM/100ML-% IV SOLN: 2.0000 g | Freq: Once | INTRAVENOUS | Status: DC

## 2016-12-27 NOTE — Progress Notes (Signed)
Nutrition Follow-up  DOCUMENTATION CODES:   Not applicable  INTERVENTION:  1. After PEG tube cleared for use, re-start Jevity 1.2 @ 43mL/hr, increase by 10 every 6 hours to goal rate of 79mL/hr  NUTRITION DIAGNOSIS:   Inadequate oral intake related to lethargy/confusion as evidenced by other (see comment) (Per observation). -ongoing GOAL:   Patient will meet greater than or equal to 90% of their needs -not meeting currently  MONITOR:   I & O's, Labs, Diet advancement, Weight trends  REASON FOR ASSESSMENT:   Low Braden    ASSESSMENT:   Darius [name]  is a 51 y.o. male who presents with Altered mental status. Patient's mother found him slumped over in his chair and unresponsive. Workup here in the states only elevated lactic acid and cocaine positive urine tox screen.  Getting PEG placed today Was previously tolerating tube feeds Does not seem to follow cues at this time per PT. Will continue to monitor tube-feeding and nutrition needs. Labs and medications reviewed: NS @ 24mL/hr  Diet Order:  Diet NPO time specified  Skin:  Reviewed, no issues  Last BM:  PTA  Height:   Ht Readings from Last 1 Encounters:  12/13/16 6' (1.829 m)    Weight:   Wt Readings from Last 1 Encounters:  12/27/16 140 lb 14.4 oz (63.9 kg)    Ideal Body Weight:  80.9 kg  BMI:  Body mass index is 19.11 kg/m.  Estimated Nutritional Needs:   Kcal:  1950 - 2250 calories  Protein:  83-109 gm  Fluid:  >/= 1.95L  EDUCATION NEEDS:   No education needs identified at this time  Satira Anis. Yasha Tibbett, MS, RD LDN Inpatient Clinical Dietitian Pager [phone]

## 2016-12-27 NOTE — NC FL2 (Signed)
[place] LEVEL OF CARE SCREENING TOOL     IDENTIFICATION  Patient Name: Darius [name] Birthdate: April 24, 1966 Sex: male Admission Date (Current Location): 12/13/2016  [facility] and [place] Number:  Engineering geologist and Address:         Provider Number: [id number]  Attending Physician Name and Address:  Demetrios Loll, MD  Relative Name and Phone Number:       Current Level of Care: Hospital Recommended Level of Care: [facility] Prior Approval Number:    Date Approved/Denied:   PASRR Number:    Discharge Plan: SNF    Current Diagnoses: Patient Active Problem List   Diagnosis Date Noted  . Poor appetite   . Palliative care by specialist   . Goals of care, counseling/discussion   . Confusion   . 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 HIV (human immunodeficiency virus infection) ([place]) 04/29/2016  . Altered mental state 04/29/2016  . Drug abuse 04/29/2016  . Hypothyroidism 04/29/2016  . Seizures ([place]) 04/29/2016    Orientation RESPIRATION BLADDER Height & Weight      (Disoriented X4)  O2 (2 Liters Oxygen ) Incontinent, Indwelling catheter Weight: 140 lb 14.4 oz (63.9 kg) Height:  6' (182.9 cm)  BEHAVIORAL SYMPTOMS/MOOD NEUROLOGICAL BOWEL NUTRITION STATUS   (none) Convulsions/Seizures Continent Feeding tube (Peg )  AMBULATORY STATUS COMMUNICATION OF NEEDS Skin   Total Care Non-Verbally Normal                       Personal Care Assistance Level of Assistance  Bathing, Feeding, Dressing, Total care Bathing Assistance: Maximum assistance Feeding assistance: Maximum assistance Dressing Assistance: Maximum assistance Total Care Assistance: Maximum assistance   Functional Limitations Info  Sight, Hearing, Speech Sight Info: Adequate Hearing Info: Adequate Speech Info: Adequate    SPECIAL CARE FACTORS FREQUENCY  PT (By licensed PT)     PT Frequency:  (2-3 )              Contractures      Additional Factors  Info  Code Status, Allergies, Isolation Precautions Code Status Info:  (Full Code ) Allergies Info:  (Asa Aspirin, Sulfa Antibiotics)     Isolation Precautions Info:  (MRSA )     Current Medications (12/27/2016):  This is the current hospital active medication list Current Facility-Administered Medications  Medication Dose Route Frequency Provider Last Rate Last Dose  . 0.9 %  sodium chloride infusion   Intravenous Continuous Lucilla Lame, MD 10 mL/hr at 12/27/16 1137 1,000 mL at 12/27/16 1137  . 0.9 % NaCl with KCl 20 mEq/ L  infusion   Intravenous Continuous Demetrios Loll, MD 50 mL/hr at 12/27/16 0625 50 mL/hr at 12/27/16 0625  . [MAR Hold] abacavir-dolutegravir-lamiVUDine (TRIUMEQ) 517-00-174 MG per tablet 1 tablet  1 tablet Oral Daily Lance Coon, MD   Stopped at 12/26/16 1000  . [MAR Hold] acetaminophen (TYLENOL) tablet 650 mg  650 mg Oral Q6H PRN Lance Coon, MD       Or  . Doug Sou Hold] acetaminophen (TYLENOL) suppository 650 mg  650 mg Rectal Q6H PRN Lance Coon, MD   650 mg at 12/14/16 0845  . [MAR Hold] amLODipine (NORVASC) tablet 10 mg  10 mg Per NG tube Daily Demetrios Loll, MD   10 mg at 12/25/16 0918  . [MAR Hold] ceFAZolin (ANCEF) 2 g in dextrose 5 % 100 mL IVPB  2 g Intravenous Q8H Demetrios Loll, MD   2 g at 12/27/16  1700  Doug Sou Hold] chlorhexidine (PERIDEX) 0.12 % solution 15 mL  15 mL Mouth Rinse BID Lance Coon, MD   15 mL at 12/27/16 0955  . [MAR Hold] enoxaparin (LOVENOX) injection 40 mg  40 mg Subcutaneous Q24H Demetrios Loll, MD   40 mg at 12/24/16 2152  . [MAR Hold] FLUoxetine (PROZAC) 20 MG/5ML solution 10 mg  10 mg Per Tube Daily Demetrios Loll, MD   10 mg at 12/25/16 0919  . [MAR Hold] hydrALAZINE (APRESOLINE) injection 10 mg  10 mg Intravenous Q6H PRN Vaughan Basta, MD   10 mg at 12/27/16 0859  . [MAR Hold] lacosamide (VIMPAT) 200 mg in sodium chloride 0.9 % 25 mL IVPB  200 mg Intravenous Q12H Alexis Goodell, MD   200 mg at 12/27/16 0336  . [MAR Hold] levETIRAcetam (KEPPRA)  1,500 mg in sodium chloride 0.9 % 100 mL IVPB  1,500 mg Intravenous Q12H Lance Coon, MD   1,500 mg at 12/27/16 0955  . [MAR Hold] levothyroxine (SYNTHROID, LEVOTHROID) injection 25 mcg  25 mcg Intravenous Daily Demetrios Loll, MD   25 mcg at 12/27/16 0957  . [MAR Hold] lidocaine (PF) (XYLOCAINE) 1 % injection 5 mL  5 mL Other Once Dustin Flock, MD      . Doug Sou Hold] LORazepam (ATIVAN) injection 2 mg  2 mg Intravenous Q2H PRN Demetrios Loll, MD   2 mg at 12/23/16 1301  . [facility] Hold] MEDLINE mouth rinse  15 mL Mouth Rinse q12n4p Lance Coon, MD   15 mL at 12/26/16 1600  . [MAR Hold] ondansetron (ZOFRAN) tablet 4 mg  4 mg Oral Q6H PRN Lance Coon, MD       Or  . Doug Sou Hold] ondansetron [facility]) injection 4 mg  4 mg Intravenous Q6H PRN Lance Coon, MD         Discharge Medications: Please see discharge summary for a list of discharge medications.  Relevant Imaging Results:  Relevant Lab Results:   Additional Information  (SSN: [id number])  Eria Lozoya, Veronia Beets, LCSW

## 2016-12-27 NOTE — Clinical Social Work Placement (Signed)
   CLINICAL SOCIAL WORK PLACEMENT  NOTE  Date:  12/27/2016  Patient Details  Name: Darius [name] MRN: [id number] Date of Birth: 01-Apr-1966  Clinical Social Work is seeking post-discharge placement for this patient at the [place] level of care (*CSW will initial, date and re-position this form in  chart as items are completed):  Yes   Patient/family provided with [place] Work Department's list of facilities offering this level of care within the geographic area requested by the patient (or if unable, by the patient's family).  Yes   Patient/family informed of their freedom to choose among providers that offer the needed level of care, that participate in Medicare, Medicaid or managed care program needed by the patient, have an available bed and are willing to accept the patient.  Yes   Patient/family informed of [facility]'s ownership interest in [facility] and [facility], as well as of the fact that they are under no obligation to receive care at these facilities.  PASRR submitted to EDS on 12/27/16     PASRR number received on       Existing PASRR number confirmed on       FL2 transmitted to all facilities in geographic area requested by pt/family on 12/27/16     FL2 transmitted to all facilities within larger geographic area on       Patient informed that his/her managed care company has contracts with or will negotiate with certain facilities, including the following:            Patient/family informed of bed offers received.  Patient chooses bed at       Physician recommends and patient chooses bed at      Patient to be transferred to   on  .  Patient to be transferred to facility by       Patient family notified on   of transfer.  Name of family member notified:        PHYSICIAN       Additional Comment:    _______________________________________________ Aitan Rossbach, Veronia Beets, LCSW 12/27/2016, 12:28 PM

## 2016-12-27 NOTE — Op Note (Signed)
[facility] Patient Name: Darius [name] Procedure Date: 12/27/2016 11:45 AM MRN: [id number] Account #: [id number] Date of Birth: 17-Mar-1966 Admit Type: Inpatient Age: 51 Room: [facility] ENDO ROOM 4 Gender: Male Note Status: Finalized Procedure:            Upper GI endoscopy Indications:          Dysphagia Providers:            Lucilla Lame MD, MD Referring MD:         No Local Md, MD (Referring MD) Medicines:            Propofol per Anesthesia Complications:        No immediate complications. Procedure:            Pre-Anesthesia Assessment:                       - Prior to the procedure, a History and Physical was                        performed, and patient medications and allergies were                        reviewed. The patient's tolerance of previous                        anesthesia was also reviewed. The risks and benefits of                        the procedure and the sedation options and risks were                        discussed with the patient. All questions were                        answered, and informed consent was obtained. Prior                        Anticoagulants: The patient has taken no previous                        anticoagulant or antiplatelet agents. ASA Grade                        Assessment: III - A patient with severe systemic                        disease. After reviewing the risks and benefits, the                        patient was deemed in satisfactory condition to undergo                        the procedure.                       After obtaining informed consent, the endoscope was                        passed under direct vision. Throughout the procedure,  the patient's blood pressure, pulse, and oxygen                        saturations were monitored continuously. The Endoscope                        was introduced through the mouth, and advanced to the   second part of duodenum. The upper GI endoscopy was                        accomplished without difficulty. The patient tolerated                        the procedure well. Findings:      The examined esophagus was normal.      The entire examined stomach was normal. Placement of an externally       removable PEG with no T-fasteners was successfully completed. The       external bumper was at the 3.0 cm marking on the tube.      Localized moderate inflammation characterized by erosions was found in       the duodenal bulb. Impression:           - Normal esophagus.                       - Normal stomach.                       - Duodenitis.                       - An externally removable PEG placement was                        successfully completed.                       - No specimens collected. Recommendation:       - Discharge patient to home.                       - Return patient to hospital ward for ongoing care.                       - Please follow the post-PEG recommendations including:                        Nutrition consult for formula and volume, change                        dressing once per day and NPO x4 hrs then water today. Procedure Code(s):    --- Professional ---                       [phone], Esophagogastroduodenoscopy, flexible, transoral;                        with directed placement of percutaneous gastrostomy tube Diagnosis Code(s):    --- Professional ---                       R13.10, Dysphagia, unspecified CPT copyright 2016 American Medical Association. All rights reserved.  The codes documented in this report are preliminary and upon coder review may  be revised to meet current compliance requirements. Lucilla Lame MD, MD 12/27/2016 12:13:22 PM This report has been signed electronically. Number of Addenda: 0 Note Initiated On: 12/27/2016 11:45 AM      [facility]

## 2016-12-27 NOTE — Anesthesia Post-op Follow-up Note (Cosign Needed)
Anesthesia QCDR form completed.        

## 2016-12-27 NOTE — Anesthesia Procedure Notes (Signed)
Performed by: Kyri Dai Pre-anesthesia Checklist: Patient identified, Emergency Drugs available, Suction available, Patient being monitored and Timeout performed Patient Re-evaluated:Patient Re-evaluated prior to inductionOxygen Delivery Method: Nasal cannula Preoxygenation: Pre-oxygenation with 100% oxygen Intubation Type: IV induction       

## 2016-12-27 NOTE — Clinical Social Work Note (Addendum)
Clinical Social Work Assessment  Patient Details  Name: Darius [name] MRN: [id number] Date of Birth: 29-Mar-1966  Date of referral:  12/27/16               Reason for consult:  Facility Placement, Discharge Planning                Permission sought to share information with:  Chartered certified accountant granted to share information::  Yes, Verbal Permission Granted  Name::      [name]::   [place]   Relationship::     Contact Information:     Housing/Transportation Living arrangements for the past 2 months:  [place] of Information:  Other (Comment Required) (Patient's mother Darius [name] ) Patient Interpreter Needed:  None Criminal Activity/Legal Involvement Pertinent to Current Situation/Hospitalization:  No - Comment as needed Significant Relationships:  Other Family Members, Parents Lives with:  Parents Do you feel safe going back to the place where you live?  Yes Need for family participation in patient care:  Yes (Comment)  Care giving concerns:  Patient lives at home in [place] with his mother Darius [name].    Social Worker assessment / plan:  Social work Systems developer consult from MD that patient will need facility placement for new PEG. Per chart patient is not alert and oriented. Social work Theatre manager contacted patient's mother Darius [name]. Per [place], patient lives at home with her in [place]. Social work Theatre manager explained that patient is getting PEG tube placement today. Patient's mother spoke to RN case manager yesterday and stated that she wanted patient to go to SNF. Patient's mother is preferring patient to be placed in [place], where patient will be closer to family. Social work Theatre manager asked patient's mother if SNF search in [place] can begin as well. Patient's mother verbally agreed, but really wants patient placed in [place]. Patient has medicare and was inpatient 12/20/16. Per patient's mother patient has medicaid.  Social work Theatre manager will continue to assist and follow as needed.   FL2 completed and faxed out. PASARR is pending.   Employment status:  Unemployed Forensic scientist:  Medicare PT Recommendations:  Not assessed at this time Information / Referral to community resources:  [place]  Patient/Family's Response to care:  Patient's mother is agreeable to SNF search in [place].   Patient/Family's Understanding of and Emotional Response to Diagnosis, Current Treatment, and Prognosis:  Patient's mother was pleasant and thanked social work Theatre manager for calling.   Emotional Assessment Appearance:  Appears stated age Attitude/Demeanor/Rapport:  Unable to Assess Affect (typically observed):  Unable to Assess Orientation:  Fluctuating Orientation (Suspected and/or reported Sundowners) Alcohol / Substance use:  Illicit Drugs Psych involvement (Current and /or in the community):  No (Comment)  Discharge Needs  Concerns to be addressed:  Basic Needs Readmission within the last 30 days:  No Current discharge risk:  Chronically ill Barriers to Discharge:  Continued Medical Work up   [employer], [place] Work 12/27/2016, 11:02 AM

## 2016-12-27 NOTE — Progress Notes (Addendum)
Clinical Education officer, museum (CSW) sent SNF referral to [facility] nurse/regional admissions coordinator for [facility] facilities in [place]. Jorje Guild extended bed offers from [facility]. Both facilities are in [place]. Social work Theatre manager called patient's mother [place] and provided bed offers. Patient's mother is to call CSW back later today or tomorrow morning with decision. CSW also contacted [facility] SNF in [place] who reported they will not be able to make a bed offer because patient will need long term care. CSW also sent referral to [facility] and spoke to [facility]. Per Kennyth Lose it is unlikely the [facility] will be able to make a bed offer. Social work Theatre manager will continue to assist and follow as needed.   Danie Chandler, Social Work Intern  [phone]

## 2016-12-27 NOTE — Anesthesia Postprocedure Evaluation (Signed)
Anesthesia Post Note  Patient: Darius [name]  Procedure(s) Performed: Procedure(s) (LRB): PERCUTANEOUS ENDOSCOPIC GASTROSTOMY (PEG) PLACEMENT (N/A)  Patient location during evaluation: Endoscopy Anesthesia Type: General Level of consciousness: comatose (baseline comatose) Pain management: pain level controlled Vital Signs Assessment: post-procedure vital signs reviewed and stable Respiratory status: spontaneous breathing, nonlabored ventilation, respiratory function stable and patient connected to nasal cannula oxygen Cardiovascular status: blood pressure returned to baseline and stable Postop Assessment: no signs of nausea or vomiting Anesthetic complications: no     Last Vitals:  Vitals:   12/27/16 1240 12/27/16 1250  BP: 109/86 (!) 132/95  Pulse: (!) 101 100  Resp: 17 14  Temp:      Last Pain:  Vitals:   12/27/16 1240  TempSrc:   PainSc: Asleep                 Precious Haws [place]

## 2016-12-27 NOTE — Transfer of Care (Signed)
Immediate Anesthesia Transfer of Care Note  Patient: Darius [name]  Procedure(s) Performed: Procedure(s): PERCUTANEOUS ENDOSCOPIC GASTROSTOMY (PEG) PLACEMENT (N/A)  Patient Location: PACU  Anesthesia Type:General  Level of Consciousness: sedated and responds to stimulation  Airway & Oxygen Therapy: Patient Spontanous Breathing and Patient connected to nasal cannula oxygen  Post-op Assessment: Report given to RN and Post -op Vital signs reviewed and stable  Post vital signs: Reviewed and stable  Last Vitals:  Vitals:   12/27/16 0950 12/27/16 1218  BP: (!) 150/107 104/73  Pulse: 97 (!) 107  Resp:  16  Temp:      Last Pain:  Vitals:   12/27/16 0902  TempSrc: Oral  PainSc:          Complications: No apparent anesthesia complications

## 2016-12-27 NOTE — Progress Notes (Signed)
PT Cancellation Note  Patient Details Name: Darius [name] MRN: [id number] DOB: 10/15/1965   Cancelled Treatment:    Reason Eval/Treat Not Completed: Other (comment).  PT consult received.  Chart reviewed.  Attempted to see pt for PT eval.  Pt did not follow any cues (vc's and tactile cues); pt did grip therapists hand strongly with his R hand (although did not appear to be following vc's from therapist) but no other active movement noted by pt.  Attempted to perform ROM with pt's B UE's and LE's but pt appearing to tense up his UE's restricting any ROM attempts and LE's also very rigid limiting any hip or knee ROM.  Pt did not verbalize any form of communication (pt did open and close his eyes intermittently but not with cueing).  D/t above, PT eval deferred.  Nursing reports plan for PEG tube placement today.  Will re-attempt PT eval at a later date/time.    Leitha Bleak, PT 12/27/16, 10:37 AM [phone]

## 2016-12-27 NOTE — Progress Notes (Signed)
In GI recovery. Report called. Abdominal binder applied over PEG tube to prevent accidental dislodgement as ordered.

## 2016-12-27 NOTE — Progress Notes (Signed)
[place] at [facility]                                                                                                                                                                                  Patient Demographics   Brydan Downard, is a 51 y.o. male, DOB - 08/21/1966, HBZ:[id number]  Admit date - 12/13/2016   Admitting Physician Lance Coon, MD  Outpatient Primary MD for the patient is Pcp Not In System   LOS - 7  Subjective: Patient admitted with acute encephalopathy. He is awake but still very confused. And not able to communicate much. His mother is at bedside. His mother reports that patient is disabled and lives at home alone but normally he is awake alert and communicative. He does have HIV and she states that last was seen and [facility]. His viral load was undetectable. According to her he is been taking his meds.  The patient is unresponsive to stimuli. He has no agitation or seizure activity.   Review of Systems:   CONSTITUTIONAL: Unable to provide  Vitals:   Vitals:   12/27/16 1220 12/27/16 1230 12/27/16 1240 12/27/16 1250  BP: 104/73 (!) 88/65 109/86 (!) 132/95  Pulse: 100 (!) 110 (!) 101 100  Resp: 15 15 17 14   Temp: (!) 96.9 F (36.1 C)     TempSrc: Tympanic     SpO2: 100% 100% 100% 99%  Weight:      Height:        Wt Readings from Last 3 Encounters:  12/27/16 140 lb 14.4 oz (63.9 kg)  04/30/16 145 lb (65.8 kg)     Intake/Output Summary (Last 24 hours) at 12/27/16 1505 Last data filed at 12/27/16 1247  Gross per 24 hour  Intake             1255 ml  Output                0 ml  Net             1255 ml    Physical Exam:   GENERAL: Pleasant-appearing in no apparent distress. He is confused HEAD, EYES, EARS, NOSE AND THROAT: Atraumatic, normocephalic. . Pupils equal and reactive to light. Sclerae anicteric. No conjunctival injection. Oral thrush. NECK: Supple. There is no jugular venous  distention. No bruits, no lymphadenopathy, no thyromegaly.  HEART: Regular rate and rhythm,. No murmurs, no rubs, no clicks.  LUNGS: Clear to auscultation bilaterally. No rales or rhonchi. No wheezes.  ABDOMEN: Soft, flat, nontender, nondistended. Has good bowel sounds. No hepatosplenomegaly appreciated.  EXTREMITIES: No evidence of any cyanosis, clubbing, or peripheral edema.  +2  pedal and radial pulses bilaterally.  NEUROLOGIC: The patient The patient is unresponsive to stimuli. Eyes opened. He does not follow commands. Unable to do neuro exam. SKIN: Moist and warm with no rashes appreciated.  Psych: Not anxious, depressed LN: No inguinal LN enlargement    Antibiotics   Anti-infectives    Start     Dose/Rate Route Frequency Ordered Stop   12/27/16 0700  ceFAZolin (ANCEF) 2 g in dextrose 5 % 100 mL IVPB     2 g 200 mL/hr over 30 Minutes Intravenous Every 8 hours 12/27/16 0645     12/27/16 0645  ceFAZolin (ANCEF) IVPB 2g/100 mL premix  Status:  Discontinued     2 g 200 mL/hr over 30 Minutes Intravenous  Once 12/27/16 0634 12/27/16 0645   12/18/16 1800  vancomycin (VANCOCIN) 750 mg in dextrose 5 % 150 mL IVPB  Status:  Discontinued     750 mg 150 mL/hr over 60 Minutes Intravenous Every 12 hours 12/18/16 0902 12/19/16 1535   12/17/16 1800  vancomycin (VANCOCIN) IVPB 750 mg/150 ml premix  Status:  Discontinued     750 mg 150 mL/hr over 60 Minutes Intravenous Every 12 hours 12/17/16 1122 12/18/16 0853   12/16/16 1200  fluconazole (DIFLUCAN) IVPB 100 mg     100 mg 50 mL/hr over 60 Minutes Intravenous Every 24 hours 12/16/16 1140 12/18/16 1254   12/15/16 1630  vancomycin (VANCOCIN) IVPB 1000 mg/200 mL premix  Status:  Discontinued     1,000 mg 200 mL/hr over 60 Minutes Intravenous Every 12 hours 12/15/16 0522 12/15/16 0920   12/15/16 1630  vancomycin (VANCOCIN) IVPB 750 mg/150 ml premix  Status:  Discontinued     750 mg 150 mL/hr over 60 Minutes Intravenous Every 12 hours 12/15/16 0920  12/15/16 0930   12/15/16 1630  vancomycin (VANCOCIN) 750 mg in sodium chloride 0.9 % 150 mL IVPB  Status:  Discontinued     750 mg 150 mL/hr over 60 Minutes Intravenous Every 12 hours 12/15/16 0930 12/17/16 1122   12/15/16 0530  vancomycin (VANCOCIN) 250 mg in sodium chloride 0.9 % 100 mL IVPB  Status:  Discontinued     250 mg 100 mL/hr over 60 Minutes Intravenous  Once 12/15/16 0523 12/15/16 0525   12/14/16 1430  cefTRIAXone (ROCEPHIN) 2 g in dextrose 5 % 50 mL IVPB  Status:  Discontinued     2 g 100 mL/hr over 30 Minutes Intravenous Every 12 hours 12/14/16 1426 12/19/16 1535   12/14/16 1000  abacavir-dolutegravir-lamiVUDine (TRIUMEQ) 600-50-300 MG per tablet 1 tablet     1 tablet Oral Daily 12/14/16 0155     12/14/16 0600  piperacillin-tazobactam (ZOSYN) IVPB 3.375 g  Status:  Discontinued     3.375 g 12.5 mL/hr over 240 Minutes Intravenous Every 8 hours 12/13/16 2232 12/14/16 1426   12/14/16 0430  vancomycin (VANCOCIN) IVPB 750 mg/150 ml premix  Status:  Discontinued     750 mg 150 mL/hr over 60 Minutes Intravenous Every 12 hours 12/13/16 2232 12/15/16 0522   12/13/16 2215  piperacillin-tazobactam (ZOSYN) IVPB 3.375 g     3.375 g 100 mL/hr over 30 Minutes Intravenous  Once 12/13/16 2212 12/13/16 2310   12/13/16 2215  vancomycin (VANCOCIN) IVPB 1000 mg/200 mL premix     1,000 mg 200 mL/hr over 60 Minutes Intravenous  Once 12/13/16 2212 12/13/16 2325      Medications   Scheduled Meds: . abacavir-dolutegravir-lamiVUDine  1 tablet Oral Daily  . amLODipine  10 mg Per  NG tube Daily  . ceFAZolin (ANCEF) IVPB (doses >1 g)  2 g Intravenous Q8H  . chlorhexidine  15 mL Mouth Rinse BID  . enoxaparin (LOVENOX) injection  40 mg Subcutaneous Q24H  . FLUoxetine  10 mg Per Tube Daily  . lacosamide (VIMPAT) IV  200 mg Intravenous Q12H  . levETIRAcetam  1,500 mg Intravenous Q12H  . levothyroxine  25 mcg Intravenous Daily  . lidocaine (PF)  5 mL Other Once  . mouth rinse  15 mL Mouth Rinse  [id number]   Continuous Infusions: . 0.9 % NaCl with KCl 20 mEq / L 50 mL/hr (12/27/16 0625)   PRN Meds:.acetaminophen **OR** acetaminophen, hydrALAZINE, LORazepam, ondansetron **OR** ondansetron (ZOFRAN) IV   Data Review:   Micro Results No results found for this or any previous visit (from the past 240 hour(s)).  Radiology Reports Ct Head Wo Contrast  Result Date: 12/13/2016 CLINICAL DATA:  Acute onset of generalized weakness. Patient not speaking. Initial encounter. EXAM: CT HEAD WITHOUT CONTRAST TECHNIQUE: Contiguous axial images were obtained from the base of the skull through the vertex without intravenous contrast. COMPARISON:  CT of the head performed 04/29/2016 FINDINGS: Brain: No evidence of acute infarction, hemorrhage, hydrocephalus, extra-axial collection or mass lesion/mass effect. Prominence of the ventricles and sulci reflects moderate cortical volume loss. Mild cerebellar atrophy is noted. Scattered periventricular and subcortical white matter change likely reflects small vessel ischemic microangiopathy. Chronic encephalomalacia is noted at the left basal ganglia, and there is ex vacuo dilatation of the left lateral ventricle. Calcification is seen at the pons. No mass effect or midline shift is seen. Chronic subdural thickening is noted on the left side. Vascular: No hyperdense vessel or unexpected calcification. Skull: There is no evidence of fracture; the patient is status post left frontoparietal craniotomy. Sinuses/Orbits: The visualized portions of the orbits are within normal limits. The paranasal sinuses and mastoid air cells are well-aerated. Other: No significant soft tissue abnormalities are seen. IMPRESSION: 1. No acute intracranial pathology seen on CT. 2. Moderate cortical volume loss and scattered small vessel ischemic microangiopathy. 3. Chronic encephalomalacia at the left basal ganglia, with ex vacuo dilatation of the left lateral ventricle. Chronic subdural thickening  on the left side. Electronically Signed   By: Garald Balding M.D.   On: 12/13/2016 23:02   Mr Brain Wo Contrast  Result Date: 12/24/2016 CLINICAL DATA:  Confusion. 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 EXAM: MRI HEAD WITHOUT CONTRAST TECHNIQUE: Multiplanar, multiecho pulse sequences of the brain and surrounding structures were obtained without intravenous contrast. COMPARISON:  Head CT 12/14/2014 FINDINGS: Brain: Cortical diffusion and FLAIR hyperintensity is greater on the right where sulci are also narrower. This is likely seizure phenomenon given the history. On diffusion imaging there is hazy DWI hyperintensity in the right centrum semiovale and posterior frontal subcortical white matter. These could reflect ischemic injuries associated with seizures. Periventricular FLAIR hyperintensity, asymmetrically greater on the left where there is encephalomalacia and gliosis contiguous with the frontal horn. Thin subdural fluid collection around the posterior left cerebral convexity, chronic based on 2017 head CT. Atrophy, especially of the brainstem and cerebellum. No hemorrhage, hydrocephalus, or mass lesion noted. Vascular: Normal flow voids. Skull and upper cervical spine: Status post craniotomy on the left. Sinuses/Orbits: No acute finding IMPRESSION: 1. Diffuse cortical diffusion and FLAIR hyperintensity on the right, likely seizure phenomenon. There are small areas of subcortical diffusion hyperintensity in the right frontal lobe which may reflect superimposed ischemic injuries. 2. Atrophy,  white matter disease, and left inferior frontal/basal ganglia encephalomalacia. 3. Chronic small left subdural collection. Electronically Signed   By: Monte Fantasia M.D.   On: 12/24/2016 11:30   Dg Chest Port 1 View  Result Date: 12/13/2016 CLINICAL DATA:  Sepsis EXAM: PORTABLE CHEST 1 VIEW COMPARISON:  04/29/2014 FINDINGS: Cardiomediastinal silhouette is stable. No infiltrate or pleural  effusion. No pulmonary edema. Bony thorax is unremarkable. IMPRESSION: No active disease. Electronically Signed   By: Lahoma Crocker M.D.   On: 12/13/2016 21:41   Dg Addison Bailey G Tube Plc W/fl W/rad  Result Date: 12/23/2016 CLINICAL DATA:  NG tube placement EXAM: NASO G TUBE PLACEMENT WITH FL AND WITH RAD FLUOROSCOPY TIME:  Fluoroscopy Time:  0.2 minute Radiation Exposure Index (if provided by the fluoroscopic device): 2 mGy Number of Acquired Spot Images: 0 COMPARISON:  None. FINDINGS: Patient presents for nasogastric tube placement under fluoroscopic guidance. A 16 French feeding tube was inserted through the right nare without difficulty. The feeding tube advanced normally without resistance. Tip of the nasogastric tube projects over the distal body of the stomach. The nasogastric tube was secured to the nose. Patient tolerated the exam without difficulty. IMPRESSION: 1. Successful placement of a 16 French feeding tube under fluoroscopic guidance. Electronically Signed   By: Kathreen Devoid   On: 12/23/2016 08:52   Dg Nile Dear Of Needle/cath Tip For Spinal Inject Lt  Result Date: 12/15/2016 CLINICAL DATA:  Abnormal mental status. EXAM: DIAGNOSTIC LUMBAR PUNCTURE UNDER FLUOROSCOPIC GUIDANCE FLUOROSCOPY TIME:  Fluoroscopy Time:  0 minutes 20 seconds Radiation Exposure Index (if provided by the fluoroscopic device): 5.4 mGy Number of Acquired Spot Images: 1 PROCEDURE: Patient unable to give consent. After discussing the risks and benefits of this procedure with the patient's mother informed consent was obtained. Back was sterilely prepped and draped. Local anesthesia administered 1% lidocaine. 22 gauge needle was advanced into the lumbar canal and clear 9 cc of clear CSF removed and sent for the requested labs. Opening pressure was 9 cm of water. Needle was removed. Hemostasis achieved. No complications. IMPRESSION: Successful fluoroscopically directed lumbar puncture. Electronically Signed   By: Marcello Moores  Register    On: 12/15/2016 12:40     CBC  Recent Labs Lab 12/21/16 0447 12/26/16 1023 12/27/16 0837  WBC 7.3 6.9 6.5  HGB 12.9* 12.7* 13.4  HCT 39.0* 38.1* 39.7*  PLT 189 274 289  MCV 99.7 97.0 97.2  MCH 33.1 32.3 32.8  MCHC 33.2 33.3 33.7  RDW 14.2 14.0 14.2    Chemistries   Recent Labs Lab 12/21/16 0447 12/23/16 0651 12/26/16 1023 12/27/16 0837  NA 138  --  135  --   K 3.8  --  3.7  --   CL 108  --  102  --   CO2 21*  --  25  --   GLUCOSE 81  --  100*  --   BUN 13  --  9  --   CREATININE 0.95  --  0.94 1.03  CALCIUM 8.9  --  8.9  --   MG  --  1.9  --   --   AST 57*  --   --   --   ALT 52  --   --   --   ALKPHOS 57  --   --   --   BILITOT 0.7  --   --   --    ------------------------------------------------------------------------------------------------------------------ estimated creatinine clearance is 79.3 mL/min (by C-G formula based on  SCr of 1.03 mg/dL). ------------------------------------------------------------------------------------------------------------------ No results for input(s): HGBA1C in the last 72 hours. ------------------------------------------------------------------------------------------------------------------ No results for input(s): CHOL, HDL, LDLCALC, TRIG, CHOLHDL, LDLDIRECT in the last 72 hours. ------------------------------------------------------------------------------------------------------------------ No results for input(s): TSH, T4TOTAL, T3FREE, THYROIDAB in the last 72 hours.  Invalid input(s): FREET3 ------------------------------------------------------------------------------------------------------------------ No results for input(s): VITAMINB12, FOLATE, FERRITIN, TIBC, IRON, RETICCTPCT in the last 72 hours.  Coagulation profile  Recent Labs Lab 12/26/16 1023  INR 1.06    No results for input(s): DDIMER in the last 72 hours.  Cardiac Enzymes No results for input(s): CKMB, TROPONINI, MYOGLOBIN in the last 168  hours.  Invalid input(s): CK ------------------------------------------------------------------------------------------------------------------ Invalid input(s): [place]  Patient is a 51 year old with HIV with acute mental status changes  1. Acute encephalopathy: Ammonia is slightly abnormal not the cause for his encephalopathy.  Per Dr. Ola Spurr, Check serum crypto, RPR, TSH: Unremarkable.  LP done,  routine labs plus Crypto ag and CSF VDRL: negative. Follow-up culture. Blood cultures negative. Urinalysis is normal but the urine culture show MRSA. cont vanco, changed zosyn to ceftraixone 2 gm q 12. Per Dr. Ola Spurr DC all abx - no evidence meningitis or infection. MRSA in Urine is likley contaminant given neg UA.  Per Dr. Doy Mince, On [place].  EEG shows no evidence of electrographic seizure.  Initially not suggestive of infection.  Cryptococcal antigen negative. 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 Per Psych consult, Most likely all of the mental status changes are not related to "psychiatric" but rather to "neurologic" illness.   Per Dr. Macie Burows, Would consider doing another EEG and decreasing Keppra more but do not want to do it when EEG not available.  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 Prozac started. Started on Provigil 100 daily which can lower seizure threshold, discontinued.  2. Seizure activity. Seizure activity today, given ativan. On neuro check every 2 hours, Ativan IV when necessary, Started Vimpat.  200mg  IV load with 100mg  IV maintenance q12 hours. Continue Keppra at 1500mg  q 12 hours,  Continue Vimpat and Keppra at the current doses, the patient needs to be transferred to her level hospital for continuous EEG monitor per neurologist Dr. Doy Mince. I called [facility] for possible transfer. I discussed the on-call hospitalist, They accepted the patient in the bed is available. But the  patient's mother preferred patient to be transferred to [facility]. I called to [facility] center and discussed with on-call neurologist there, we will schedule a lot of detailed questions and wants to discuss with Dr. Doy Mince.  Per Dr. Doy Mince, [facility] on call neurologist will not accept the patient afterafter discussion with her. The patient will stay in the hospital to continue current care. Patient now on maximum Keppra and Vimpat.   If further seizure activity noted, would start Depakote at 1000mg  load and maintenance of 500mg  q 8 hours, with levels to be followed.  Continue Keppra and Vimpat at current doses.   continue Keppra and Vimpat.  If no further improvement will consider need to repeat MRI with contrast per Dr Doy Mince.  Dr. Irish Elders agrees.  3. Hypothyroidism continue Synthroid.  4. HIV continue HIV meds. 5.DVT prophylaxis, resumed Lovenox.  CKD stage II. Stable.  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  Oral thrush. Was on iv Diflucan. Accelerated hypertension. Unable to po Norvasc, on IV hydralazine when necessary.  The patient pulled off NG tube Dr. Allen Norris put PEG tube today. follow the post-PEG recommendations.  I discussed with Dr. Irish Elders, case manager and social worker for possible LTAC or SNF placement. PT consult per case manager.      Code Status Orders        Start     Ordered   12/14/16 0156  Full code  Continuous     12/14/16 0155    Code Status History    Date Active Date Inactive Code Status Order ID Comments User Context   04/30/2016 12:53 AM 04/30/2016  5:45 PM Full Code [id number]  Lance Coon, MD Inpatient      Consults  Id ,neuro DVT Prophylaxis  scd's   Lab Results  Component Value Date   PLT 289 12/27/2016     Time Spent in minutes   32 min  Greater than 50% of time spent in care coordination and counseling patient regarding the condition and plan of care.   Demetrios Loll M.D on 12/27/2016 at 3:05 PM  Between 7am to 6pm - Pager -  [phone]  After 6pm go to [email or web address] - password [email or web address] [facility]   Office  [phone]

## 2016-12-27 NOTE — Progress Notes (Signed)
PEG bandage dry and intact

## 2016-12-27 NOTE — Anesthesia Preprocedure Evaluation (Signed)
Anesthesia Evaluation  Patient identified by MRN, date of birth, ID band Patient confused    Reviewed: Allergy & Precautions, H&P , NPO status , Patient's Chart, lab work & pertinent test results  History of Anesthesia Complications Negative for: history of anesthetic complications  Airway Mallampati: III  TM Distance: >3 FB Neck ROM: full    Dental  (+) Poor Dentition, Chipped, Missing   Pulmonary neg shortness of breath, Current Smoker,    Pulmonary exam normal breath sounds clear to auscultation       Cardiovascular Exercise Tolerance: Poor (-) angina(-) Past MI negative cardio ROS Normal cardiovascular exam Rhythm:regular Rate:Normal     Neuro/Psych Seizures -, Poorly Controlled,  PSYCHIATRIC DISORDERS    GI/Hepatic negative GI ROS, (+) Hepatitis -, C  Endo/Other  Hypothyroidism   Renal/GU Renal disease  negative genitourinary   Musculoskeletal   Abdominal   Peds  Hematology negative hematology ROS (+)   Anesthesia Other Findings Past Medical History: No date: CKD (chronic kidney disease), stage III 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) No date: Drug abuse No date: Hepatitis C No date: HIV (human immunodeficiency virus infection) (* No date: Hypothyroidism No date: Seizures ([place])  Past Surgical History: No date: CRANIOTOMY  BMI    Body Mass Index:  19.11 kg/m      Reproductive/Obstetrics negative OB ROS                             Anesthesia Physical Anesthesia Plan  ASA: III  Anesthesia Plan: General   Post-op Pain Management:    Induction:   Airway Management Planned:   Additional Equipment:   Intra-op Plan:   Post-operative Plan:   Informed Consent: I have reviewed the patients History and Physical, chart, labs and discussed the procedure including the risks, benefits and alternatives for the proposed anesthesia with the patient or authorized representative who has  indicated his/her understanding and acceptance.   Dental Advisory Given  Plan Discussed with: Anesthesiologist, CRNA and Surgeon  Anesthesia Plan Comments: (Patient has had thorough eval for his encephalopathy and seizures.  From medicine notes his encephalopathy is being explained by progressive HIV and seizures.  Patients mother consented via phone at [phone])        Anesthesia Quick Evaluation

## 2016-12-28 ENCOUNTER — Inpatient Hospital Stay: Payer: Medicare Other

## 2016-12-28 LAB — GLUCOSE, CAPILLARY
GLUCOSE-CAPILLARY: 94 mg/dL (ref 65–99)
GLUCOSE-CAPILLARY: 94 mg/dL (ref 65–99)
Glucose-Capillary: 86 mg/dL (ref 65–99)
Glucose-Capillary: 105 mg/dL — ABNORMAL HIGH (ref 65–99)

## 2016-12-28 LAB — PHOSPHORUS: PHOSPHORUS: 3.3 mg/dL (ref 2.5–4.6)

## 2016-12-28 LAB — MAGNESIUM: MAGNESIUM: 2 mg/dL (ref 1.7–2.4)

## 2016-12-28 MED ORDER — LEVOTHYROXINE SODIUM 50 MCG PO TABS
50.0000 ug | ORAL_TABLET | Freq: Every day | ORAL | Status: DC
  Administered 2016-12-29: 50 ug
  Filled 2016-12-28: qty 1

## 2016-12-28 MED ORDER — JEVITY 1.2 CAL PO LIQD
1000.0000 mL | ORAL | Status: DC
  Administered 2016-12-29: 1000 mL

## 2016-12-28 MED ORDER — GADOBENATE DIMEGLUMINE 529 MG/ML IV SOLN
10.0000 mL | Freq: Once | INTRAVENOUS | Status: AC | PRN
  Administered 2016-12-28: 9 mL via INTRAVENOUS

## 2016-12-28 MED ORDER — SODIUM CHLORIDE 0.9 % IV SOLN
200.0000 mg | Freq: Two times a day (BID) | INTRAVENOUS | Status: DC
  Filled 2016-12-28 (×2): qty 20

## 2016-12-28 MED ORDER — LORAZEPAM 2 MG/ML PO CONC: 2.0000 mg | ORAL | Status: DC | PRN

## 2016-12-28 MED ORDER — LEVETIRACETAM 100 MG/ML PO SOLN
1000.0000 mg | Freq: Two times a day (BID) | ORAL | Status: DC
  Administered 2016-12-28 – 2016-12-29 (×2): 1000 mg
  Filled 2016-12-28 (×5): qty 10

## 2016-12-28 MED ORDER — LACOSAMIDE 10 MG/ML PO SOLN: 200.0000 mg | Freq: Two times a day (BID) | ORAL | Status: DC

## 2016-12-28 MED ORDER — AMLODIPINE BESYLATE 10 MG PO TABS
10.0000 mg | ORAL_TABLET | Freq: Every day | ORAL | Status: DC
  Administered 2016-12-28 – 2016-12-29 (×2): 10 mg
  Filled 2016-12-28 (×2): qty 1

## 2016-12-28 MED ORDER — FREE WATER
150.0000 mL | Freq: Four times a day (QID) | Status: DC
  Administered 2016-12-28 – 2016-12-29 (×3): 150 mL

## 2016-12-28 NOTE — Progress Notes (Signed)
BP 164/106. PRN Hydralazine given. MD Sudini notified. No new orders

## 2016-12-28 NOTE — Progress Notes (Signed)
Nutrition Follow-up  DOCUMENTATION CODES:   Not applicable  INTERVENTION:  1. Restart Jevity 1.2 @ 50mL/hr, increase by 10 every 6 hours to goal rate of 65mL/hr, 150cc free water q6h  NUTRITION DIAGNOSIS:   Inadequate oral intake related to lethargy/confusion as evidenced by other (see comment) (Per observation). -ongoing  GOAL:   Patient will meet greater than or equal to 90% of their needs -will meet with tf at goal  MONITOR:   TF tolerance, I & O's, Labs, Weight trends  REASON FOR ASSESSMENT:   Low Braden    ASSESSMENT:   Darius [name]  is a 51 y.o. male who presents with Altered mental status. Patient's mother found him slumped over in his chair and unresponsive. Workup here in the states only elevated lactic acid and cocaine positive urine tox screen.  PEG cleared for use. TF ordered Labs and medications reviewed Will monitor for tolerance  Diet Order:  Diet NPO time specified  Skin:  Reviewed, no issues  Last BM:  PTA  Height:   Ht Readings from Last 1 Encounters:  12/28/16 6' (1.829 m)    Weight:   Wt Readings from Last 1 Encounters:  12/28/16 141 lb 3.2 oz (64 kg)    Ideal Body Weight:  80.9 kg  BMI:  Body mass index is 19.15 kg/m.  Estimated Nutritional Needs:   Kcal:  1950 - 2250 calories  Protein:  83-109 gm  Fluid:  >/= 1.95L  EDUCATION NEEDS:   No education needs identified at this time  Satira Anis. Mariesa Grieder, MS, RD LDN Inpatient Clinical Dietitian Pager [phone]

## 2016-12-28 NOTE — Progress Notes (Signed)
[place] at [facility]                                                                                                                                                                                  Patient Demographics   Darius [name], is a 51 y.o. male, DOB - 1966-01-18, YNW:[id number]  Admit date - 12/13/2016   Admitting Physician Lance Coon, MD  Outpatient Primary MD for the patient is Pcp Not In System   LOS - 8  Subjective: Patient admitted with acute encephalopathy.  He is awake but still confused. And not able to communicate. His mother reports that patient is disabled and lives at home alone but normally he is awake alert and communicative.  No seizures  Review of Systems:    Unable to Obtain due to mental status  Vitals:   Vitals:   12/27/16 2327 12/28/16 0500 12/28/16 0755 12/28/16 0920  BP: (!) 143/96  (!) 164/106 (!) 148/90  Pulse: 93     Resp: 18     Temp: 98.6 F (37 C)  98.1 F (36.7 C)   TempSrc: Oral  Axillary   SpO2: 97%  100%   Weight:  64 kg (141 lb 3.2 oz)    Height:  6' (1.829 m)      Wt Readings from Last 3 Encounters:  12/28/16 64 kg (141 lb 3.2 oz)  04/30/16 65.8 kg (145 lb)     Intake/Output Summary (Last 24 hours) at 12/28/16 1423 Last data filed at 12/28/16 0558  Gross per 24 hour  Intake             1870 ml  Output                0 ml  Net             1870 ml    Physical Exam:   GENERAL:  no apparent distress. He is confused HEAD, EYES, EARS, NOSE AND THROAT: Atraumatic, normocephalic. . Pupils equal and reactive to light. Sclerae anicteric. No conjunctival injection. NECK: Supple. There is no jugular venous distention. No bruits, no lymphadenopathy, no thyromegaly.  HEART: Regular rate and rhythm,. No murmurs, no rubs, no clicks.  LUNGS: Clear to auscultation bilaterally. No rales or rhonchi. No wheezes.  ABDOMEN: Soft, flat, nontender, nondistended. Has good bowel sounds. No hepatosplenomegaly  appreciated.  EXTREMITIES: No evidence of any cyanosis, clubbing, or peripheral edema.  +2 pedal and radial pulses bilaterally.  NEUROLOGIC: The patient The patient is unresponsive to stimuli. Eyes opened. He does not follow commands. Unable to do neuro exam. SKIN: Moist and warm with no rashes appreciated.  Psych: Not anxious  Antibiotics   Anti-infectives    Start     Dose/Rate Route Frequency Ordered Stop   12/27/16 0700  ceFAZolin (ANCEF) 2 g in dextrose 5 % 100 mL IVPB  Status:  Discontinued     2 g 200 mL/hr over 30 Minutes Intravenous Every 8 hours 12/27/16 0645 12/28/16 1346   12/27/16 0645  ceFAZolin (ANCEF) IVPB 2g/100 mL premix  Status:  Discontinued     2 g 200 mL/hr over 30 Minutes Intravenous  Once 12/27/16 0634 12/27/16 0645   12/18/16 1800  vancomycin (VANCOCIN) 750 mg in dextrose 5 % 150 mL IVPB  Status:  Discontinued     750 mg 150 mL/hr over 60 Minutes Intravenous Every 12 hours 12/18/16 0902 12/19/16 1535   12/17/16 1800  vancomycin (VANCOCIN) IVPB 750 mg/150 ml premix  Status:  Discontinued     750 mg 150 mL/hr over 60 Minutes Intravenous Every 12 hours 12/17/16 1122 12/18/16 0853   12/16/16 1200  fluconazole (DIFLUCAN) IVPB 100 mg     100 mg 50 mL/hr over 60 Minutes Intravenous Every 24 hours 12/16/16 1140 12/18/16 1254   12/15/16 1630  vancomycin (VANCOCIN) IVPB 1000 mg/200 mL premix  Status:  Discontinued     1,000 mg 200 mL/hr over 60 Minutes Intravenous Every 12 hours 12/15/16 0522 12/15/16 0920   12/15/16 1630  vancomycin (VANCOCIN) IVPB 750 mg/150 ml premix  Status:  Discontinued     750 mg 150 mL/hr over 60 Minutes Intravenous Every 12 hours 12/15/16 0920 12/15/16 0930   12/15/16 1630  vancomycin (VANCOCIN) 750 mg in sodium chloride 0.9 % 150 mL IVPB  Status:  Discontinued     750 mg 150 mL/hr over 60 Minutes Intravenous Every 12 hours 12/15/16 0930 12/17/16 1122   12/15/16 0530  vancomycin (VANCOCIN) 250 mg in sodium chloride 0.9 % 100 mL IVPB   Status:  Discontinued     250 mg 100 mL/hr over 60 Minutes Intravenous  Once 12/15/16 0523 12/15/16 0525   12/14/16 1430  cefTRIAXone (ROCEPHIN) 2 g in dextrose 5 % 50 mL IVPB  Status:  Discontinued     2 g 100 mL/hr over 30 Minutes Intravenous Every 12 hours 12/14/16 1426 12/19/16 1535   12/14/16 1000  abacavir-dolutegravir-lamiVUDine (TRIUMEQ) 600-50-300 MG per tablet 1 tablet     1 tablet Oral Daily 12/14/16 0155     12/14/16 0600  piperacillin-tazobactam (ZOSYN) IVPB 3.375 g  Status:  Discontinued     3.375 g 12.5 mL/hr over 240 Minutes Intravenous Every 8 hours 12/13/16 2232 12/14/16 1426   12/14/16 0430  vancomycin (VANCOCIN) IVPB 750 mg/150 ml premix  Status:  Discontinued     750 mg 150 mL/hr over 60 Minutes Intravenous Every 12 hours 12/13/16 2232 12/15/16 0522   12/13/16 2215  piperacillin-tazobactam (ZOSYN) IVPB 3.375 g     3.375 g 100 mL/hr over 30 Minutes Intravenous  Once 12/13/16 2212 12/13/16 2310   12/13/16 2215  vancomycin (VANCOCIN) IVPB 1000 mg/200 mL premix     1,000 mg 200 mL/hr over 60 Minutes Intravenous  Once 12/13/16 2212 12/13/16 2325      Medications   Scheduled Meds: . abacavir-dolutegravir-lamiVUDine  1 tablet Oral Daily  . amLODipine  10 mg Per NG tube Daily  . chlorhexidine  15 mL Mouth Rinse BID  . enoxaparin (LOVENOX) injection  40 mg Subcutaneous Q24H  . FLUoxetine  10 mg Per Tube Daily  . free water  150 mL Per Tube Q6H  . lacosamide (  VIMPAT) IV  200 mg Intravenous Q12H  . levETIRAcetam  1,500 mg Intravenous Q12H  . levothyroxine  25 mcg Intravenous Daily  . lidocaine (PF)  5 mL Other Once  . mouth rinse  15 mL Mouth Rinse q12n4p   Continuous Infusions: . 0.9 % NaCl with KCl 20 mEq / L 50 mL/hr at 12/28/16 0801  . feeding supplement (JEVITY 1.2 CAL)     PRN Meds:.acetaminophen **OR** acetaminophen, hydrALAZINE, LORazepam, ondansetron **OR** ondansetron (ZOFRAN) IV   Data Review:   Micro Results No results found for this or any  previous visit (from the past 240 hour(s)).  Radiology Reports Ct Head Wo Contrast  Result Date: 12/13/2016 CLINICAL DATA:  Acute onset of generalized weakness. Patient not speaking. Initial encounter. EXAM: CT HEAD WITHOUT CONTRAST TECHNIQUE: Contiguous axial images were obtained from the base of the skull through the vertex without intravenous contrast. COMPARISON:  CT of the head performed 04/29/2016 FINDINGS: Brain: No evidence of acute infarction, hemorrhage, hydrocephalus, extra-axial collection or mass lesion/mass effect. Prominence of the ventricles and sulci reflects moderate cortical volume loss. Mild cerebellar atrophy is noted. Scattered periventricular and subcortical white matter change likely reflects small vessel ischemic microangiopathy. Chronic encephalomalacia is noted at the left basal ganglia, and there is ex vacuo dilatation of the left lateral ventricle. Calcification is seen at the pons. No mass effect or midline shift is seen. Chronic subdural thickening is noted on the left side. Vascular: No hyperdense vessel or unexpected calcification. Skull: There is no evidence of fracture; the patient is status post left frontoparietal craniotomy. Sinuses/Orbits: The visualized portions of the orbits are within normal limits. The paranasal sinuses and mastoid air cells are well-aerated. Other: No significant soft tissue abnormalities are seen. IMPRESSION: 1. No acute intracranial pathology seen on CT. 2. Moderate cortical volume loss and scattered small vessel ischemic microangiopathy. 3. Chronic encephalomalacia at the left basal ganglia, with ex vacuo dilatation of the left lateral ventricle. Chronic subdural thickening on the left side. Electronically Signed   By: Garald Balding M.D.   On: 12/13/2016 23:02   Mr Brain Wo Contrast  Result Date: 12/24/2016 CLINICAL DATA:  Confusion. 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 EXAM: MRI HEAD WITHOUT CONTRAST TECHNIQUE:  Multiplanar, multiecho pulse sequences of the brain and surrounding structures were obtained without intravenous contrast. COMPARISON:  Head CT 12/14/2014 FINDINGS: Brain: Cortical diffusion and FLAIR hyperintensity is greater on the right where sulci are also narrower. This is likely seizure phenomenon given the history. On diffusion imaging there is hazy DWI hyperintensity in the right centrum semiovale and posterior frontal subcortical white matter. These could reflect ischemic injuries associated with seizures. Periventricular FLAIR hyperintensity, asymmetrically greater on the left where there is encephalomalacia and gliosis contiguous with the frontal horn. Thin subdural fluid collection around the posterior left cerebral convexity, chronic based on 2017 head CT. Atrophy, especially of the brainstem and cerebellum. No hemorrhage, hydrocephalus, or mass lesion noted. Vascular: Normal flow voids. Skull and upper cervical spine: Status post craniotomy on the left. Sinuses/Orbits: No acute finding IMPRESSION: 1. Diffuse cortical diffusion and FLAIR hyperintensity on the right, likely seizure phenomenon. There are small areas of subcortical diffusion hyperintensity in the right frontal lobe which may reflect superimposed ischemic injuries. 2. Atrophy, white matter disease, and left inferior frontal/basal ganglia encephalomalacia. 3. Chronic small left subdural collection. Electronically Signed   By: Monte Fantasia M.D.   On: 12/24/2016 11:30   Mr Jeri Cos UE Contrast  Result Date: 12/28/2016 CLINICAL DATA:  51 year old male with acute encephalopathy. HIV, reportedly compliant with medications and latest viral load was undetectable. EXAM: MRI HEAD WITHOUT AND WITH CONTRAST TECHNIQUE: Multiplanar, multiecho pulse sequences of the brain and surrounding structures were obtained without and with intravenous contrast. CONTRAST:  63mL MULTIHANCE GADOBENATE DIMEGLUMINE 529 MG/ML IV SOLN COMPARISON:  Brain MRI 01/23/2017  without contrast. Head CTs 12/13/2016 and earlier. FINDINGS: Brain:  Generalized cerebral volume loss. Superimposed previous left frontotemporal craniotomy and left hemisphere encephalomalacia including the anterior inferior left frontal gyrus, left frontal horn periventricular white matter, and left deep gray matter nuclei - especially the left basal ganglia. Associated hemosiderin in the medial left basal ganglia, and also cortically based hemosiderin in the left parietal lobe (series 11, image 22). Ex vacuo enlargement of the low left lateral ventricle. There is also focal encephalomalacia in the right caudate nucleus. These findings are without associated mass effect and unchanged from the recent comparison MRI. Similar appearance of widespread right hemisphere gyriform T2 and FLAIR hyperintensity, plus subtle increased trace diffusion. The finding is most notable on FLAIR (series 13) resulting in exaggerated gray-white matter differentiation throughout the right hemisphere. Although the right hemisphere gyri a may be mildly swollen there is no significant mass effect or mass like areas of involvement. Continued two indistinct more pronounced areas of trace diffusion abnormality which seem to localize to the cortex and/or subcortical white matter of the posterior right MCA territory (series 7, images 35 and 36). No associated mass effect. Following contrast there are only two small areas of gyriform enhancement, one of which corresponds to the more anterior diffusion abnormality (series 15, image 43) while the second is in the lateral right occipital lobe (series 15, image 31). No other abnormal intracranial enhancement. No dural thickening. There are occasional chronic micro hemorrhages suspected in the right temporal lobe (series 11, image 10). Mild nonspecific right hemisphere periventricular white matter T2 and FLAIR hyperintensity without mass effect. No midline shift, mass effect, evidence of mass lesion,  ventriculomegaly, or acute intracranial hemorrhage. Cervicomedullary junction and pituitary are within normal limits. Stable small left extra-axial collection underlying the craniotomy. Vascular: Major intracranial vascular flow voids are stable and appear normal. Major dural venous sinuses are normally enhancing. Skull and upper cervical spine: Negative. Sinuses/Orbits: Leftward gaze deviation today. Otherwise negative orbits soft tissues. Stable mild maxillary sinus mucosal thickening. Other: Visible internal auditory structures appear normal. Mastoids are clear. Negative scalp soft tissues. IMPRESSION: 1. Continued widespread gyriform diffusion, T2 and FLAIR hyperintensity involving the vast majority of the right hemisphere. Although mild gyral edema is suspected throughout, the appearance is not masslike and there is no associated mass effect. Following contrast there are only 2 small areas of gyriform enhancement in the right frontal and occipital lobes. This continues to be most suspicious for widespread postictal changes in the right hemisphere. The two areas of enhancement most resemble that seen due to small cortical infarcts. Has an EEG been done since 12/24/2016? The appearance would be very atypical for acute infectious encephalitis, and there is no meningeal enhancement or dural thickening to suggest meningitis. CNS lymphoma can occasionally be gyriform and mimick infarcts, but is felt unlikely due to both the unilaterality, and also the largely unremarkable CSF cell count on 12/15/2016. 2. Significant chronic nonspecific encephalomalacia in the left hemisphere with previous left frontotemporal craniotomy. 3. 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 Electronically Signed   By: Genevie Ann M.D.   On:  12/28/2016 13:44   Dg Chest Port 1 View  Result Date: 12/28/2016 CLINICAL DATA:  Shortness of breath. EXAM: PORTABLE CHEST 1 VIEW COMPARISON:  12/13/2016  FINDINGS: Heart size is normal. Unfolding of the aorta. There is volume loss in both lower lobes left more than right. This could be due to pneumonia or atelectasis. Upper lungs are clear. IMPRESSION: Volume loss at both lung bases left worse than right. Most likely pneumonia. Other causes of atelectasis are also possible. Electronically Signed   By: Nelson Chimes M.D.   On: 12/28/2016 11:36   Dg Chest Port 1 View  Result Date: 12/13/2016 CLINICAL DATA:  Sepsis EXAM: PORTABLE CHEST 1 VIEW COMPARISON:  04/29/2014 FINDINGS: Cardiomediastinal silhouette is stable. No infiltrate or pleural effusion. No pulmonary edema. Bony thorax is unremarkable. IMPRESSION: No active disease. Electronically Signed   By: Lahoma Crocker M.D.   On: 12/13/2016 21:41   Dg Addison Bailey G Tube Plc W/fl W/rad  Result Date: 12/23/2016 CLINICAL DATA:  NG tube placement EXAM: NASO G TUBE PLACEMENT WITH FL AND WITH RAD FLUOROSCOPY TIME:  Fluoroscopy Time:  0.2 minute Radiation Exposure Index (if provided by the fluoroscopic device): 2 mGy Number of Acquired Spot Images: 0 COMPARISON:  None. FINDINGS: Patient presents for nasogastric tube placement under fluoroscopic guidance. A 16 French feeding tube was inserted through the right nare without difficulty. The feeding tube advanced normally without resistance. Tip of the nasogastric tube projects over the distal body of the stomach. The nasogastric tube was secured to the nose. Patient tolerated the exam without difficulty. IMPRESSION: 1. Successful placement of a 16 French feeding tube under fluoroscopic guidance. Electronically Signed   By: Kathreen Devoid   On: 12/23/2016 08:52   Dg Nile Dear Of Needle/cath Tip For Spinal Inject Lt  Result Date: 12/15/2016 CLINICAL DATA:  Abnormal mental status. EXAM: DIAGNOSTIC LUMBAR PUNCTURE UNDER FLUOROSCOPIC GUIDANCE FLUOROSCOPY TIME:  Fluoroscopy Time:  0 minutes 20 seconds Radiation Exposure Index (if provided by the fluoroscopic device): 5.4 mGy  Number of Acquired Spot Images: 1 PROCEDURE: Patient unable to give consent. After discussing the risks and benefits of this procedure with the patient's mother informed consent was obtained. Back was sterilely prepped and draped. Local anesthesia administered 1% lidocaine. 22 gauge needle was advanced into the lumbar canal and clear 9 cc of clear CSF removed and sent for the requested labs. Opening pressure was 9 cm of water. Needle was removed. Hemostasis achieved. No complications. IMPRESSION: Successful fluoroscopically directed lumbar puncture. Electronically Signed   By: Marcello Moores  Register   On: 12/15/2016 12:40     CBC  Recent Labs Lab 12/26/16 1023 12/27/16 0837  WBC 6.9 6.5  HGB 12.7* 13.4  HCT 38.1* 39.7*  PLT 274 289  MCV 97.0 97.2  MCH 32.3 32.8  MCHC 33.3 33.7  RDW 14.0 14.2    Chemistries   Recent Labs Lab 12/23/16 0651 12/26/16 1023 12/27/16 0837  NA  --  135  --   K  --  3.7  --   CL  --  102  --   CO2  --  25  --   GLUCOSE  --  100*  --   BUN  --  9  --   CREATININE  --  0.94 1.03  CALCIUM  --  8.9  --   MG 1.9  --   --    ------------------------------------------------------------------------------------------------------------------ estimated creatinine clearance is 79.4 mL/min (by C-G formula based on SCr of 1.03 mg/dL). ------------------------------------------------------------------------------------------------------------------  No results for input(s): HGBA1C in the last 72 hours. ------------------------------------------------------------------------------------------------------------------ No results for input(s): CHOL, HDL, LDLCALC, TRIG, CHOLHDL, LDLDIRECT in the last 72 hours. ------------------------------------------------------------------------------------------------------------------ No results for input(s): TSH, T4TOTAL, T3FREE, THYROIDAB in the last 72 hours.  Invalid input(s):  FREET3 ------------------------------------------------------------------------------------------------------------------ No results for input(s): VITAMINB12, FOLATE, FERRITIN, TIBC, IRON, RETICCTPCT in the last 72 hours.  Coagulation profile  Recent Labs Lab 12/26/16 1023  INR 1.06    No results for input(s): DDIMER in the last 72 hours.  Cardiac Enzymes No results for input(s): CKMB, TROPONINI, MYOGLOBIN in the last 168 hours.  Invalid input(s): CK ------------------------------------------------------------------------------------------------------------------ Invalid input(s): [place]  Patient is a 51 year old with HIV with acute mental status changes  * Acute encephalopathy: Ammonia is slightly abnormal not the cause for his encephalopathy. -serum crypto, RPR, TSH: Unremarkable. - LP done,  routine labs plus Crypto ag and CSF VDRL: negative. Blood cultures negative. Urinalysis is normal but the urine culture show MRSA. Per Dr. Ola Spurr DC all abx - no evidence meningitis or infection. MRSA in Urine is likley contaminant given neg UA.  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 Per Psych consult, Most likely all of the mental status changes are not related to "psychiatric" but rather to "neurologic" illness.  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  * Seizure activity. Had seizures in the hospital. On Keppra and Vimpat. EEG showed no epileptiform waves. Transferred to [facility] attempted by Dr. Doy Mince of neurology. Transfer to be unnecessary by [facility] neurologist. Advised continuing treatment here. If further seizure activity noted, would start Depakote at 1000mg  load and maintenance of 500mg  q 8 hours, with levels to be followed.  Continue Keppra and Vimpat at current doses.  Repeat MRI with similar changes since prior MRI. Discussed with Dr. Irish Elders who will review the results and make changes as needed.  * Hypothyroidism  continue Synthroid.  * HIV continue meds. Seen by infectious disease.  *DVT prophylaxis Lovenox.  * CKD stage II. Stable.  * Cocaine abuse.   * Oral thrush. Was on IV Diflucan. Resolved.  * Accelerated hypertension.  An IV hydralazine when necessary. Will add Norvasc through 2.  The patient pulled off NG tube PEG tube placed on 12/27/2016. We'll start tube feeds. Consult dietitian.  SNF at discharge      Code Status Orders        Start     Ordered   12/14/16 0156  Full code  Continuous     12/14/16 0155    Code Status History    Date Active Date Inactive Code Status Order ID Comments User Context   04/30/2016 12:53 AM 04/30/2016  5:45 PM Full Code [id number]  Lance Coon, MD Inpatient      Consults  Id ,neuro DVT Prophylaxis  scd's   Lab Results  Component Value Date   PLT 289 12/27/2016    Time Spent in minutes   5 min  Hillary Bow R M.D on 12/28/2016 at 2:23 PM  Between 7am to 6pm - Pager - [phone]  After 6pm go to [email or web address] - password [email or web address] [facility]   Office  [phone]

## 2016-12-28 NOTE — Evaluation (Signed)
Physical Therapy Evaluation Patient Details Name: Darius [name] MRN: [id number] DOB: 07-27-1966 Today's Date: 12/28/2016   History of Present Illness  Pt is a 51 y.o. male admitted 12/13/16 with weakness and AMS (pt found unresponsive and slumped over in chair at home).  Pt admitted to hospital with AMS/acute encephalopathy, drug abuse (positive urine tox screen), HIV, and seizures.  Multiple seizures noted during hospitalization.  PEG placed 12/27/16.  PMH includes: HIV, h/o crypto meningitis 2000, CNS lymphoma s/p radiation and chemo in 2006, seizure disorder, active Hep C, CKD, craniotomy (SDH requiring evacuation 07/2007).  Clinical Impression  Prior to hospital admission, per chart pt appeared ambulatory.  Pt lives with his mother.  Currently pt is total assist to sit on edge of bed.  Pt did not follow any commands during session (pt actively moving R UE on own but not to any cueing; no movement noted in L UE or B LE's).  Able to perform minimal PROM to B UE's (limited d/t pt appearing to resist) and unable to flex pt's B knees and hips in bed (pt appearing "stiff" but pt's B hips and knees did bend appropriately with sitting at edge of bed).  Pt did not perform any compensatory movements or show any type of functional reaction/movement with attempted functional mobility and sitting edge of bed (total assist for sitting balance).  D/t pt appearing to be mobile prior to hospital admission, will monitor pt's progress and ability to participate in therapy (will trial PT to determine if pt would benefit from SNF rehabilitation).    Follow Up Recommendations SNF (Trial PT )    Equipment Recommendations   (TBD at post acute setting)    Recommendations for Other Services       Precautions / Restrictions Precautions Precautions: Fall Precaution Comments: PEG tube; HOB >30 degrees; NPO; seizure precautions; abdominal binder Restrictions Weight Bearing Restrictions: No Other Position/Activity  Restrictions: Bedrest with bathroom privileges      Mobility  Bed Mobility Overal bed mobility: Needs Assistance Bed Mobility: Supine to Sit;Sit to Supine     Supine to sit: Total assist;+2 for physical assistance;HOB elevated Sit to supine: Total assist;+2 for physical assistance;HOB elevated   General bed mobility comments: 3 total assist for bed mobility; no initiation noted by pt  Transfers                 General transfer comment: Deferred d/t pt not following commands and requiring assist for upright posture  Ambulation/Gait             General Gait Details: Not appropriate at this time (see above)  Stairs            Wheelchair Mobility    Modified Rankin (Stroke Patients Only)       Balance Overall balance assessment: Needs assistance Sitting-balance support: Bilateral upper extremity supported;Feet unsupported Sitting balance-Leahy Scale: Zero Sitting balance - Comments: Total assist for upright posture with max cueing Postural control: Posterior lean                                   Pertinent Vitals/Pain Pain Assessment: Faces Faces Pain Scale: No hurt Pain Intervention(s): Limited activity within patient's tolerance;Monitored during session;Repositioned  Vitals (HR and O2 on room air) stable and WFL throughout treatment session.    Home Living Family/patient expects to be discharged to:: Private residence Living Arrangements: Parent (Per chart pt lives with his mother  who works) Available Help at Discharge: Family                  Prior Function Level of Independence: Needs assistance   Gait / Transfers Assistance Needed: Per chart pt appears to have been ambulatory.  ADL's / Homemaking Assistance Needed: Per chart pt is independent with ADL's, cooks for self; pt's mother fills pt's pill box.  Comments: Per chart pt was disabled.     Hand Dominance        Extremity/Trunk Assessment   Upper Extremity  Assessment Upper Extremity Assessment: Difficult to assess due to impaired cognition (Limited UE ROM d/t pt appearing to resist movement; pt actively moving R UE on own but does not initiate any movement with L UE)    Lower Extremity Assessment Lower Extremity Assessment: Difficult to assess due to impaired cognition (No active movement noted B LE's; resistance noted to any hip and knee flexion ROM in bed but both did flex when sitting on edge of bed; B DF to neutral)       Communication   Communication: Receptive difficulties;Expressive difficulties  Cognition Arousal/Alertness: Awake/alert Behavior During Therapy: Restless;Flat affect;Impulsive Overall Cognitive Status: No family/caregiver present to determine baseline cognitive functioning                                 General Comments: Pt did not verbalize or communicate via any manner during session.      General Comments General comments (skin integrity, edema, etc.): Drainage noted around midline dressing R UE and on pt's pillow prior to any therapy activity (nursing notified to check on it).  Nursing cleared pt for participation in physical therapy.  Pt agreeable to PT session.    Exercises     Assessment/Plan    PT Assessment Patient needs continued PT services  PT Problem List Decreased strength;Decreased range of motion;Decreased activity tolerance;Decreased balance;Decreased mobility;Decreased coordination;Decreased cognition       PT Treatment Interventions DME instruction;Gait training;Functional mobility training;Therapeutic activities;Therapeutic exercise;Balance training;Neuromuscular re-education;Patient/family education    PT Goals (Current goals can be found in the Care Plan section)  Acute Rehab PT Goals PT Goal Formulation: Patient unable to participate in goal setting    Frequency  (Will trial PT for 3 sessions)   Barriers to discharge Decreased caregiver support      Co-evaluation                End of Session   Activity Tolerance: Patient limited by lethargy Patient left: in bed;with call bell/phone within reach;with bed alarm set;with nursing/sitter in room (B heels elevated via pillow) Nurse Communication: Mobility status;Precautions (Drainage from pt's R midline) PT Visit Diagnosis: Muscle weakness (generalized) (M62.81);Difficulty in walking, not elsewhere classified (R26.2)    Time: 1535-1550 PT Time Calculation (min) (ACUTE ONLY): 15 min   Charges:   PT Evaluation $PT Eval Low Complexity: 1 Procedure     PT G CodesLeitha Bleak, PT 12/29/16, 8:22 AM [phone]

## 2016-12-28 NOTE — Progress Notes (Signed)
g tube residual check < 5 ml

## 2016-12-28 NOTE — Progress Notes (Signed)
Midline in RUA leaking and coiled up upon assessment. Midline removed, pressure applied and dressing in place. IV team notified for new midline placement.

## 2016-12-28 NOTE — Progress Notes (Addendum)
Clinical Education officer, museum (CSW) received a Advertising account executive from patient's mother Darius [name] stating that she has chosen [facility] on [name] in [place] called [place] back and confirmed her choice. CSW made [facility] aware that patient will be transported to [facility] via EMS when medically stable for D/C. Jorje Guild RN/ admissions for [facility] is aware of accepted bed offer. CSW will continue to follow and assist as needed.  PASARR has been received, [id number].  [employer], LCSW [phone]

## 2016-12-28 NOTE — Progress Notes (Signed)
PEG tube feeds started Jevity 1.2  @ 20 ml Q6, water flush 150 ml Q6, bowel sounds hypoactive.

## 2016-12-29 LAB — GLUCOSE, CAPILLARY
GLUCOSE-CAPILLARY: 114 mg/dL — AB (ref 65–99)
GLUCOSE-CAPILLARY: 106 mg/dL — AB (ref 65–99)
Glucose-Capillary: 108 mg/dL — ABNORMAL HIGH (ref 65–99)
Glucose-Capillary: 125 mg/dL — ABNORMAL HIGH (ref 65–99)

## 2016-12-29 MED ORDER — LACOSAMIDE 10 MG/ML PO SOLN: 200.0000 mg | Freq: Two times a day (BID) | ORAL | Status: AC

## 2016-12-29 MED ORDER — FREE WATER: 150.0000 mL | Freq: Four times a day (QID) | Status: AC

## 2016-12-29 MED ORDER — JEVITY 1.2 CAL PO LIQD: 1000.0000 mL | ORAL | 0 refills | Status: AC

## 2016-12-29 MED ORDER — LEVETIRACETAM 100 MG/ML PO SOLN: 1000.0000 mg | Freq: Two times a day (BID) | ORAL | 12 refills | Status: AC

## 2016-12-29 MED ORDER — AMLODIPINE BESYLATE 10 MG PO TABS: 10.0000 mg | ORAL_TABLET | Freq: Every day | ORAL | Status: AC

## 2016-12-29 MED ORDER — LEVOTHYROXINE SODIUM 50 MCG PO TABS: 50.0000 ug | ORAL_TABLET | Freq: Every day | ORAL | Status: AC

## 2016-12-29 NOTE — Progress Notes (Signed)
Speech Therapy Cancellation Note:  Speech Therapy Note - reviewed chart notes; no recent seizures reported. Pt has now received a PEG placement for consistent, more long term nutrition and hydration. Pt has continued to present w/ reduced mental/cognitive status remaining nonverbal and unable to follow commands. Noted Neurology and MD notes/interactions w/ pt as well. Per NSG notes, pt does not engage or interact; eyes are open at times. Pt was unable to participate in PT evaluation.  Recommend pt have f/u ST services at discharge to SNF for a full Cognitive assessment to determine more long term poc goals. Recommend frequent oral care d/t PEG placement/non-oral diet; aspiration precautions and Reflux precautions including HOB elevated d/t TFs via PEG.   Orinda Kenner, [place], CCC-SLP

## 2016-12-29 NOTE — Discharge Instructions (Signed)
All medications thru PEG tube.  NPO  F/U with NEUROLOGY AT [facility] for Seizures

## 2016-12-29 NOTE — Progress Notes (Addendum)
Patient is medically stable for discharge today to [facility]. Per Jorje Guild, RN for [facility], patient can come to room 220. Social work Theatre manager sent discharge summary to [employer]. Social work Theatre manager spoke to patient's mother, Darius [name], making her aware of patient's discharge today. Darius [name] is agreeable to discharge today. RN will call report and arrange EMS for transport. Please re-consult if future social work need arise. Social work Architectural technologist off.   Danie Chandler, Social Work Intern  [phone]

## 2016-12-29 NOTE — Progress Notes (Signed)
4 attempts to call report, disconnected

## 2016-12-29 NOTE — Clinical Social Work Placement (Signed)
   CLINICAL SOCIAL WORK PLACEMENT  NOTE  Date:  12/29/2016  Patient Details  Name: Darius [name] MRN: [id number] Date of Birth: 09-22-1965  Clinical Social Work is seeking post-discharge placement for this patient at the [place] level of care (*CSW will initial, date and re-position this form in  chart as items are completed):  Yes   Patient/family provided with [place] Work Department's list of facilities offering this level of care within the geographic area requested by the patient (or if unable, by the patient's family).  Yes   Patient/family informed of their freedom to choose among providers that offer the needed level of care, that participate in Medicare, Medicaid or managed care program needed by the patient, have an available bed and are willing to accept the patient.  Yes   Patient/family informed of [facility]'s ownership interest in [facility] and [facility], as well as of the fact that they are under no obligation to receive care at these facilities.  PASRR submitted to EDS on 12/27/16     PASRR number received on 12/28/16     Existing PASRR number confirmed on       FL2 transmitted to all facilities in geographic area requested by pt/family on 12/27/16     FL2 transmitted to all facilities within larger geographic area on       Patient informed that his/her managed care company has contracts with or will negotiate with certain facilities, including the following:        Yes   Patient/family informed of bed offers received.  Patient chooses bed at  [facility] (SNF) )     Physician recommends and patient chooses bed at      Patient to be transferred to  [facility] (SNF) ) on 12/29/16.  Patient to be transferred to facility by  [facility] )     Patient family notified on 12/29/16 of transfer.  Name of family member notified:   (Patient's mother [name] is aware of D/C today. )      PHYSICIAN       Additional Comment:    _______________________________________________ Jameson Morrow, Veronia Beets, LCSW 12/29/2016, 12:26 PM

## 2016-12-29 NOTE — Progress Notes (Signed)
Subjective: Patient unchanged.  No further clinical seizures noted.    Objective: Current vital signs: BP (!) 144/93 (BP Location: Right Arm)   Pulse 94   Temp 98.3 F (36.8 C) (Oral)   Resp 18   Ht 6' (1.829 m)   Wt 63.4 kg (139 lb 11.2 oz)   SpO2 100%   BMI 18.95 kg/m  Vital signs in last 24 hours: Temp:  [98.3 F (36.8 C)-98.8 F (37.1 C)] 98.3 F (36.8 C) (04/12 0746) Pulse Rate:  [80-94] 94 (04/12 0746) Resp:  [18] 18 (04/12 0020) BP: (120-144)/(79-93) 144/93 (04/12 0746) SpO2:  [100 %] 100 % (04/12 0746) Weight:  [63.4 kg (139 lb 11.2 oz)] 63.4 kg (139 lb 11.2 oz) (04/12 0500)  Intake/Output from previous day: 04/11 0701 - 04/12 0700 In: 300 [NG/GT:300] Out: -  Intake/Output this shift: No intake/output data recorded. Nutritional status: Diet NPO time specified  Neurologic Exam: Mental Status: Eyes open. No verbal response noted but does grunt.Does not answer any orientation questions. Does not follow commands. Left neglect. Cranial Nerves: II: Pupils equal, round, reactive to light and accommodation III,IV, VI: ptosis not present, right gaze preference, goes to midline in what appears to be an attempt to search the room V,VII: Facesymmetric VIII: hearing normal bilaterally IX,X: unable to test XI: unable to test XII: unable to test Motor: Localizes to pain with RUE and moves the RUE spontaneously.  Sensory: Does not respond to noxious stimuli in anyextremity  Lab Results: Basic Metabolic Panel:  Recent Labs Lab 12/23/16 0651 12/26/16 1023 12/27/16 0837 12/28/16 1722  NA  --  135  --   --   K  --  3.7  --   --   CL  --  102  --   --   CO2  --  25  --   --   GLUCOSE  --  100*  --   --   BUN  --  9  --   --   CREATININE  --  0.94 1.03  --   CALCIUM  --  8.9  --   --   MG 1.9  --   --  2.0  PHOS 3.5  --   --  3.3    Liver Function Tests: No results for input(s): AST, ALT, ALKPHOS, BILITOT, PROT, ALBUMIN in the last 168 hours. No  results for input(s): LIPASE, AMYLASE in the last 168 hours. No results for input(s): AMMONIA in the last 168 hours.  CBC:  Recent Labs Lab 12/26/16 1023 12/27/16 0837  WBC 6.9 6.5  HGB 12.7* 13.4  HCT 38.1* 39.7*  MCV 97.0 97.2  PLT 274 289    Cardiac Enzymes: No results for input(s): CKTOTAL, CKMB, CKMBINDEX, TROPONINI in the last 168 hours.  Lipid Panel: No results for input(s): CHOL, TRIG, HDL, CHOLHDL, VLDL, LDLCALC in the last 168 hours.  CBG:  Recent Labs Lab 12/28/16 1950 12/29/16 0020 12/29/16 0454 12/29/16 0741 12/29/16 1125  GLUCAP 105* 108* 114* 106* 125*    Microbiology: Results for orders placed or performed during the hospital encounter of 12/13/16  Blood Culture (routine x 2)     Status: None   Collection Time: 12/13/16  8:13 PM  Result Value Ref Range Status   Specimen Description BLOOD R ARM  Final   Special Requests BOTTLES DRAWN AEROBIC AND ANAEROBIC [place]  Final   Culture NO GROWTH 5 DAYS  Final   Report Status 12/18/2016 FINAL  Final  Urine culture  Status: Abnormal   Collection Time: 12/13/16  8:13 PM  Result Value Ref Range Status   Specimen Description URINE, RANDOM  Final   Special Requests NONE  Final   Culture (A)  Final    >=100,000 COLONIES/mL METHICILLIN RESISTANT STAPHYLOCOCCUS AUREUS   Report Status 12/16/2016 FINAL  Final   Organism ID, Bacteria METHICILLIN RESISTANT STAPHYLOCOCCUS AUREUS (A)  Final      Susceptibility   Methicillin resistant staphylococcus aureus - MIC*    CIPROFLOXACIN >=8 RESISTANT Resistant     GENTAMICIN >=16 RESISTANT Resistant     NITROFURANTOIN <=16 SENSITIVE Sensitive     OXACILLIN >=4 RESISTANT Resistant     TETRACYCLINE <=1 SENSITIVE Sensitive     VANCOMYCIN 1 SENSITIVE Sensitive     TRIMETH/SULFA <=10 SENSITIVE Sensitive     CLINDAMYCIN >=8 RESISTANT Resistant     RIFAMPIN <=0.5 SENSITIVE Sensitive     Inducible Clindamycin NEGATIVE Sensitive     * >=100,000 COLONIES/mL METHICILLIN  RESISTANT STAPHYLOCOCCUS AUREUS  Blood Culture (routine x 2)     Status: None   Collection Time: 12/13/16 10:24 PM  Result Value Ref Range Status   Specimen Description BLOOD  R ARM  Final   Special Requests BOTTLES DRAWN AEROBIC AND ANAEROBIC  BCAV  Final   Culture NO GROWTH 5 DAYS  Final   Report Status 12/18/2016 FINAL  Final  CSF culture     Status: None   Collection Time: 12/15/16 11:30 AM  Result Value Ref Range Status   Specimen Description CSF  Final   Special Requests NONE  Final   Gram Stain   Final    MODERATE RED BLOOD CELLS PRESENT RARE WBC SEEN NO ORGANISMS SEEN    Culture   Final    NO GROWTH 3 DAYS Performed at [facility], [address], [place], [place] [address]    Report Status 12/19/2016 FINAL  Final  Culture, fungus without smear     Status: None (Preliminary result)   Collection Time: 12/15/16 11:30 AM  Result Value Ref Range Status   Specimen Description CSF  Final   Special Requests NONE  Final   Culture   Final    NO FUNGUS ISOLATED AFTER 7 DAYS Performed at [facility], [place] [address] [place], [place] [address]    Report Status PENDING  Incomplete    Coagulation Studies: No results for input(s): LABPROT, INR in the last 72 hours.  Imaging: Mr Jeri Cos VX Contrast  Result Date: 12/28/2016 CLINICAL DATA:  51 year old male with acute encephalopathy. HIV, reportedly compliant with medications and latest viral load was undetectable. EXAM: MRI HEAD WITHOUT AND WITH CONTRAST TECHNIQUE: Multiplanar, multiecho pulse sequences of the brain and surrounding structures were obtained without and with intravenous contrast. CONTRAST:  75mL MULTIHANCE GADOBENATE DIMEGLUMINE 529 MG/ML IV SOLN COMPARISON:  Brain MRI 01/23/2017 without contrast. Head CTs 12/13/2016 and earlier. FINDINGS: Brain:  Generalized cerebral volume loss. Superimposed previous left frontotemporal craniotomy and left hemisphere encephalomalacia including the anterior inferior left  frontal gyrus, left frontal horn periventricular white matter, and left deep gray matter nuclei - especially the left basal ganglia. Associated hemosiderin in the medial left basal ganglia, and also cortically based hemosiderin in the left parietal lobe (series 11, image 22). Ex vacuo enlargement of the low left lateral ventricle. There is also focal encephalomalacia in the right caudate nucleus. These findings are without associated mass effect and unchanged from the recent comparison MRI. Similar appearance of widespread right  hemisphere gyriform T2 and FLAIR hyperintensity, plus subtle increased trace diffusion. The finding is most notable on FLAIR (series 13) resulting in exaggerated gray-white matter differentiation throughout the right hemisphere. Although the right hemisphere gyri a may be mildly swollen there is no significant mass effect or mass like areas of involvement. Continued two indistinct more pronounced areas of trace diffusion abnormality which seem to localize to the cortex and/or subcortical white matter of the posterior right MCA territory (series 7, images 35 and 36). No associated mass effect. Following contrast there are only two small areas of gyriform enhancement, one of which corresponds to the more anterior diffusion abnormality (series 15, image 43) while the second is in the lateral right occipital lobe (series 15, image 31). No other abnormal intracranial enhancement. No dural thickening. There are occasional chronic micro hemorrhages suspected in the right temporal lobe (series 11, image 10). Mild nonspecific right hemisphere periventricular white matter T2 and FLAIR hyperintensity without mass effect. No midline shift, mass effect, evidence of mass lesion, ventriculomegaly, or acute intracranial hemorrhage. Cervicomedullary junction and pituitary are within normal limits. Stable small left extra-axial collection underlying the craniotomy. Vascular: Major intracranial vascular flow  voids are stable and appear normal. Major dural venous sinuses are normally enhancing. Skull and upper cervical spine: Negative. Sinuses/Orbits: Leftward gaze deviation today. Otherwise negative orbits soft tissues. Stable mild maxillary sinus mucosal thickening. Other: Visible internal auditory structures appear normal. Mastoids are clear. Negative scalp soft tissues. IMPRESSION: 1. Continued widespread gyriform diffusion, T2 and FLAIR hyperintensity involving the vast majority of the right hemisphere. Although mild gyral edema is suspected throughout, the appearance is not masslike and there is no associated mass effect. Following contrast there are only 2 small areas of gyriform enhancement in the right frontal and occipital lobes. This continues to be most suspicious for widespread postictal changes in the right hemisphere. The two areas of enhancement most resemble that seen due to small cortical infarcts. Has an EEG been done since 12/24/2016? The appearance would be very atypical for acute infectious encephalitis, and there is no meningeal enhancement or dural thickening to suggest meningitis. CNS lymphoma can occasionally be gyriform and mimick infarcts, but is felt unlikely due to both the unilaterality, and also the largely unremarkable CSF cell count on 12/15/2016. 2. Significant chronic nonspecific encephalomalacia in the left hemisphere with previous left frontotemporal craniotomy. 3. 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 Electronically Signed   By: Genevie Ann M.D.   On: 12/28/2016 13:44   Dg Chest Port 1 View  Result Date: 12/28/2016 CLINICAL DATA:  Shortness of breath. EXAM: PORTABLE CHEST 1 VIEW COMPARISON:  12/13/2016 FINDINGS: Heart size is normal. Unfolding of the aorta. There is volume loss in both lower lobes left more than right. This could be due to pneumonia or atelectasis. Upper lungs are clear. IMPRESSION: Volume loss at both lung bases  left worse than right. Most likely pneumonia. Other causes of atelectasis are also possible. Electronically Signed   By: Nelson Chimes M.D.   On: 12/28/2016 11:36    Medications:  I have reviewed the patient's current medications. Scheduled: . abacavir-dolutegravir-lamiVUDine  1 tablet Oral Daily  . amLODipine  10 mg Per Tube Daily  . chlorhexidine  15 mL Mouth Rinse BID  . enoxaparin (LOVENOX) injection  40 mg Subcutaneous Q24H  . FLUoxetine  10 mg Per Tube Daily  . free water  150 mL Per Tube Q6H  . lacosamide (VIMPAT) IV  200 mg  Intravenous Q12H  . levETIRAcetam  1,000 mg Per Tube BID  . levothyroxine  50 mcg Per Tube QAC breakfast  . lidocaine (PF)  5 mL Other Once  . mouth rinse  15 mL Mouth Rinse q12n4p    Assessment/Plan: No further seizures noted.  Work up has been unremarkable.  Repeat MRI of the brain performed with contrast and shows right hemispheric changes likely related to seizure activity but no findings suggestive of infection, mass or lymphoma.  It is hopeful that if seizures can continue to be controlled so that MRI findings can resolve, patient will improve with time.    Recommendations: 1.  Continue Keppra and Vimpat at current doses.       LOS: 9 days   Alexis Goodell, MD Neurology [phone] 12/29/2016  12:21 PM

## 2016-12-29 NOTE — Discharge Summary (Signed)
[facility] [place] NAME: Darius [name]    MR#:  [id number]  DATE OF BIRTH:  April 24, 1966  DATE OF ADMISSION:  12/13/2016 ADMITTING PHYSICIAN: Lance Coon, MD  DATE OF DISCHARGE: 12/29/2016  PRIMARY CARE PHYSICIAN: Pcp Not In System   ADMISSION DIAGNOSIS:  alteredmental status Dysphagia  DISCHARGE DIAGNOSIS:  Principal Problem:   Altered mental state Active Problems:   HIV (human immunodeficiency virus infection) ([place])   Drug abuse   Hypothyroidism   Seizures ([place])   Confusion   Palliative care by specialist   Goals of care, counseling/discussion   Poor appetite   SECONDARY DIAGNOSIS:   Past Medical History:  Diagnosis Date  . CKD (chronic kidney disease), stage III   . CNS lymphoma ([place])   . Drug abuse   . Hepatitis C   . HIV (human immunodeficiency virus infection) ([place])   . Hypothyroidism   . Seizures ([place])      ADMITTING HISTORY  HISTORY OF PRESENT ILLNESS:  Darius [name]  is a 51 y.o. male who presents with Altered mental status. Patient's mother found him slumped over in his chair and unresponsive. Workup here in the states only elevated lactic acid and cocaine positive urine tox screen. Patient has been admitted in the past with seizures, likely induced by substance abuse. However, he does have HIV, so he was given IV antibiotics upfront for concern of possible infection. Hospitals were called for admission.   HOSPITAL COURSE:   Patient is a 51 year old with HIV with acute mental status changes  * Acute encephalopathy: Ammonia is slightly abnormal not the cause for his encephalopathy. -serum crypto, RPR, TSH: Unremarkable. - LP done,  routine labs plus Crypto ag and CSF VDRL: negative. Blood cultures negative. Urinalysis is normal but the urine culture show MRSA. Per Dr. Ola Spurr DC all abx - no evidence meningitis or infection. MRSA in Urine is likley contaminant given neg UA.  Can not rule out a  progressive HIV dementia. Per Psych consult, Most likely all of the mental status changes are not related to "psychiatric" but rather due to "neurologic" illness.  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  He was placed on broad-spectrum antibiotics during the hospital stay due to concern for central nervous system infection. Once results were negative all his antibiotics have been stopped.  * Seizure activity. Had seizures in the hospital. On Keppra and Vimpat. EEG showed no epileptiform waves. Transfer to [facility] attempted by Dr. Doy Mince of neurology. Transfer thought  to be unnecessary by [facility] neurologist. Advised continuing treatment here. Continue Keppra and Vimpat at current doses.  Repeat MRI with similar changes since prior MRI.  Discussed with Dr. Doy Mince of Neurology. No change in medications. Will need outpatient follow-up with neurology at [facility].  * Hypothyroidism continue Synthroid.  * HIV continue meds. Seen by infectious disease. No change in meds  *DVT prophylaxis On Lovenox during the hospital stay  * CKD stage II. Stable.  * Chronic Cocaine abuse.   * Oral thrush. Was on IV Diflucan. Resolved. Stopped  * Accelerated hypertension.  On Norvasc  * Severe protein calorie Malnutrition PEG tube placed. Patient is nothing by mouth. Presently on continuous tube feeds. Can transition to bolus feeds and one 2 days.  Patient has attained maximal benefit from his hospital stay. His mental status might be a new baseline. Needs neurology follow-up as outpatient. Continue PEG tube feeds. Nothing by mouth.  Stable for discharge to  skilled nursing facility in [place].  CONSULTS OBTAINED:  Treatment Team:  Leonel Ramsay, MD Catarina Hartshorn, MD Gonzella Lex, MD Hillary Bow, MD  DRUG ALLERGIES:   Allergies  Allergen Reactions  . Asa [Aspirin] Other (See Comments)    Reaction: unknown  . Sulfa Antibiotics Other (See  Comments)    Reaction: unknown    DISCHARGE MEDICATIONS:   Current Discharge Medication List    START taking these medications   Details  amLODipine (NORVASC) 10 MG tablet Place 1 tablet (10 mg total) into feeding tube daily.    FLUoxetine (PROZAC) 20 MG/5ML solution Place 2.5 mLs (10 mg total) into feeding tube daily. Qty: 120 mL, Refills: 3    hydrALAZINE (APRESOLINE) 20 MG/ML injection Inject 0.5 mLs (10 mg total) into the vein every 6 (six) hours as needed (Systolic >063/[id number] >355). Qty: 1 mL    lacosamide (VIMPAT) 10 MG/ML oral solution Take 20 mLs (200 mg total) by mouth 2 (two) times daily. Qty: 600 mL    levETIRAcetam (KEPPRA) 100 MG/ML solution Place 10 mLs (1,000 mg total) into feeding tube 2 (two) times daily. Qty: 473 mL, Refills: 12    Nutritional Supplements (FEEDING SUPPLEMENT, JEVITY 1.2 CAL,) LIQD Place 1,000 mLs into feeding tube continuous. Refills: 0    Water For Irrigation, Sterile (FREE WATER) SOLN Place 150 mLs into feeding tube every 6 (six) hours.      CONTINUE these medications which have CHANGED   Details  levothyroxine (SYNTHROID, LEVOTHROID) 50 MCG tablet Place 1 tablet (50 mcg total) into feeding tube daily before breakfast.      CONTINUE these medications which have NOT CHANGED   Details  abacavir-dolutegravir-lamiVUDine (TRIUMEQ) 600-50-300 MG tablet Take 1 tablet by mouth daily.      STOP taking these medications     levETIRAcetam (KEPPRA) 500 MG tablet         Today   VITAL SIGNS:  Blood pressure (!) 144/93, pulse 94, temperature 98.3 F (36.8 C), temperature source Oral, resp. rate 18, height 6' (1.829 m), weight 63.4 kg (139 lb 11.2 oz), SpO2 100 %.  I/O:   Intake/Output Summary (Last 24 hours) at 12/29/16 1205 Last data filed at 12/29/16 0500  Gross per 24 hour  Intake              300 ml  Output                0 ml  Net              300 ml    PHYSICAL EXAMINATION:  Physical Exam  GENERAL:  51 y.o.-year-old  patient lying in the bed with no acute distress.  LUNGS: Normal breath sounds bilaterally CARDIOVASCULAR: S1, S2 normal. No murmurs, rubs, or gallops.  ABDOMEN: Soft, non-tender, non-distended.  PEG tube in place PSYCHIATRIC: The patient is awake. Non verbal.  DATA REVIEW:   CBC  Recent Labs Lab 12/27/16 0837  WBC 6.5  HGB 13.4  HCT 39.7*  PLT 289    Chemistries   Recent Labs Lab 12/26/16 1023 12/27/16 0837 12/28/16 1722  NA 135  --   --   K 3.7  --   --   CL 102  --   --   CO2 25  --   --   GLUCOSE 100*  --   --   BUN 9  --   --   CREATININE 0.94 1.03  --   CALCIUM 8.9  --   --  MG  --   --  2.0    Cardiac Enzymes No results for input(s): TROPONINI in the last 168 hours.  Microbiology Results  Results for orders placed or performed during the hospital encounter of 12/13/16  Blood Culture (routine x 2)     Status: None   Collection Time: 12/13/16  8:13 PM  Result Value Ref Range Status   Specimen Description BLOOD R ARM  Final   Special Requests BOTTLES DRAWN AEROBIC AND ANAEROBIC [place]  Final   Culture NO GROWTH 5 DAYS  Final   Report Status 12/18/2016 FINAL  Final  Urine culture     Status: Abnormal   Collection Time: 12/13/16  8:13 PM  Result Value Ref Range Status   Specimen Description URINE, RANDOM  Final   Special Requests NONE  Final   Culture (A)  Final    >=100,000 COLONIES/mL METHICILLIN RESISTANT STAPHYLOCOCCUS AUREUS   Report Status 12/16/2016 FINAL  Final   Organism ID, Bacteria METHICILLIN RESISTANT STAPHYLOCOCCUS AUREUS (A)  Final      Susceptibility   Methicillin resistant staphylococcus aureus - MIC*    CIPROFLOXACIN >=8 RESISTANT Resistant     GENTAMICIN >=16 RESISTANT Resistant     NITROFURANTOIN <=16 SENSITIVE Sensitive     OXACILLIN >=4 RESISTANT Resistant     TETRACYCLINE <=1 SENSITIVE Sensitive     VANCOMYCIN 1 SENSITIVE Sensitive     TRIMETH/SULFA <=10 SENSITIVE Sensitive     CLINDAMYCIN >=8 RESISTANT Resistant     RIFAMPIN  <=0.5 SENSITIVE Sensitive     Inducible Clindamycin NEGATIVE Sensitive     * >=100,000 COLONIES/mL METHICILLIN RESISTANT STAPHYLOCOCCUS AUREUS  Blood Culture (routine x 2)     Status: None   Collection Time: 12/13/16 10:24 PM  Result Value Ref Range Status   Specimen Description BLOOD  R ARM  Final   Special Requests BOTTLES DRAWN AEROBIC AND ANAEROBIC  BCAV  Final   Culture NO GROWTH 5 DAYS  Final   Report Status 12/18/2016 FINAL  Final  CSF culture     Status: None   Collection Time: 12/15/16 11:30 AM  Result Value Ref Range Status   Specimen Description CSF  Final   Special Requests NONE  Final   Gram Stain   Final    MODERATE RED BLOOD CELLS PRESENT RARE WBC SEEN NO ORGANISMS SEEN    Culture   Final    NO GROWTH 3 DAYS Performed at [facility], [address], [place], [place] [address]    Report Status 12/19/2016 FINAL  Final  Culture, fungus without smear     Status: None (Preliminary result)   Collection Time: 12/15/16 11:30 AM  Result Value Ref Range Status   Specimen Description CSF  Final   Special Requests NONE  Final   Culture   Final    NO FUNGUS ISOLATED AFTER 7 DAYS Performed at [facility], [place] [address], [place], [place] [address]    Report Status PENDING  Incomplete    RADIOLOGY:  Mr Jeri Cos Wo Contrast  Result Date: 12/28/2016 CLINICAL DATA:  51 year old male with acute encephalopathy. HIV, reportedly compliant with medications and latest viral load was undetectable. EXAM: MRI HEAD WITHOUT AND WITH CONTRAST TECHNIQUE: Multiplanar, multiecho pulse sequences of the brain and surrounding structures were obtained without and with intravenous contrast. CONTRAST:  6mL MULTIHANCE GADOBENATE DIMEGLUMINE 529 MG/ML IV SOLN COMPARISON:  Brain MRI 01/23/2017 without contrast. Head CTs 12/13/2016 and earlier. FINDINGS: Brain:  Generalized cerebral  volume loss. Superimposed previous left frontotemporal craniotomy and left hemisphere encephalomalacia  including the anterior inferior left frontal gyrus, left frontal horn periventricular white matter, and left deep gray matter nuclei - especially the left basal ganglia. Associated hemosiderin in the medial left basal ganglia, and also cortically based hemosiderin in the left parietal lobe (series 11, image 22). Ex vacuo enlargement of the low left lateral ventricle. There is also focal encephalomalacia in the right caudate nucleus. These findings are without associated mass effect and unchanged from the recent comparison MRI. Similar appearance of widespread right hemisphere gyriform T2 and FLAIR hyperintensity, plus subtle increased trace diffusion. The finding is most notable on FLAIR (series 13) resulting in exaggerated gray-white matter differentiation throughout the right hemisphere. Although the right hemisphere gyri a may be mildly swollen there is no significant mass effect or mass like areas of involvement. Continued two indistinct more pronounced areas of trace diffusion abnormality which seem to localize to the cortex and/or subcortical white matter of the posterior right MCA territory (series 7, images 35 and 36). No associated mass effect. Following contrast there are only two small areas of gyriform enhancement, one of which corresponds to the more anterior diffusion abnormality (series 15, image 43) while the second is in the lateral right occipital lobe (series 15, image 31). No other abnormal intracranial enhancement. No dural thickening. There are occasional chronic micro hemorrhages suspected in the right temporal lobe (series 11, image 10). Mild nonspecific right hemisphere periventricular white matter T2 and FLAIR hyperintensity without mass effect. No midline shift, mass effect, evidence of mass lesion, ventriculomegaly, or acute intracranial hemorrhage. Cervicomedullary junction and pituitary are within normal limits. Stable small left extra-axial collection underlying the craniotomy.  Vascular: Major intracranial vascular flow voids are stable and appear normal. Major dural venous sinuses are normally enhancing. Skull and upper cervical spine: Negative. Sinuses/Orbits: Leftward gaze deviation today. Otherwise negative orbits soft tissues. Stable mild maxillary sinus mucosal thickening. Other: Visible internal auditory structures appear normal. Mastoids are clear. Negative scalp soft tissues. IMPRESSION: 1. Continued widespread gyriform diffusion, T2 and FLAIR hyperintensity involving the vast majority of the right hemisphere. Although mild gyral edema is suspected throughout, the appearance is not masslike and there is no associated mass effect. Following contrast there are only 2 small areas of gyriform enhancement in the right frontal and occipital lobes. This continues to be most suspicious for widespread postictal changes in the right hemisphere. The two areas of enhancement most resemble that seen due to small cortical infarcts. Has an EEG been done since 12/24/2016? The appearance would be very atypical for acute infectious encephalitis, and there is no meningeal enhancement or dural thickening to suggest meningitis. CNS lymphoma can occasionally be gyriform and mimick infarcts, but is felt unlikely due to both the unilaterality, and also the largely unremarkable CSF cell count on 12/15/2016. 2. Significant chronic nonspecific encephalomalacia in the left hemisphere with previous left frontotemporal craniotomy. 3. 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 Electronically Signed   By: Genevie Ann M.D.   On: 12/28/2016 13:44   Dg Chest Port 1 View  Result Date: 12/28/2016 CLINICAL DATA:  Shortness of breath. EXAM: PORTABLE CHEST 1 VIEW COMPARISON:  12/13/2016 FINDINGS: Heart size is normal. Unfolding of the aorta. There is volume loss in both lower lobes left more than right. This could be due to pneumonia or atelectasis. Upper lungs are clear.  IMPRESSION: Volume loss at both lung bases left worse than right. Most likely  pneumonia. Other causes of atelectasis are also possible. Electronically Signed   By: Nelson Chimes M.D.   On: 12/28/2016 11:36    Follow up with PCP in 1 week.  Management plans discussed with the patient, family and they are in agreement.  CODE STATUS:     Code Status Orders        Start     Ordered   12/14/16 0156  Full code  Continuous     12/14/16 0155    Code Status History    Date Active Date Inactive Code Status Order ID Comments User Context   04/30/2016 12:53 AM 04/30/2016  5:45 PM Full Code [id number]  Lance Coon, MD Inpatient      TOTAL TIME TAKING CARE OF THIS PATIENT ON DAY OF DISCHARGE: more than 30 minutes.   Hillary Bow R M.D on 12/29/2016 at 12:05 PM  Between 7am to 6pm - Pager - [phone]  After 6pm go to [email or web address] - password [email or web address] [facility]  Office  [phone]  CC: Primary care physician; Pcp Not In System  Note: This dictation was prepared with Dragon dictation along with smaller phrase technology. Any transcriptional errors that result from this process are unintentional.

## 2017-01-06 LAB — CULTURE, FUNGUS WITHOUT SMEAR

## 2017-04-19 DEATH — deceased

## 2018-03-30 IMAGING — CT CT HEAD W/O CM
3 series · 15 of 46 positions shown, 18 images · non-contrast
Comparison: CT of the head performed 04/29/2016

CLINICAL DATA: Acute onset of generalized weakness. Patient not
speaking. Initial encounter.

EXAM:
CT HEAD WITHOUT CONTRAST
TECHNIQUE: Contiguous axial images were obtained from the base of the skull
through the vertex without intravenous contrast.

[Series 3: ax head wo · axial · 0.43mm/px · z∈[-167,-54]mm · 9 of 29 slices shown, 12 images]
[im 3/29  brain]
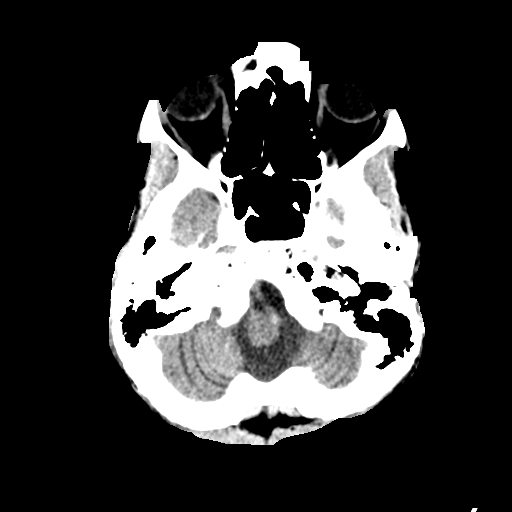
[im 3/29  bone]
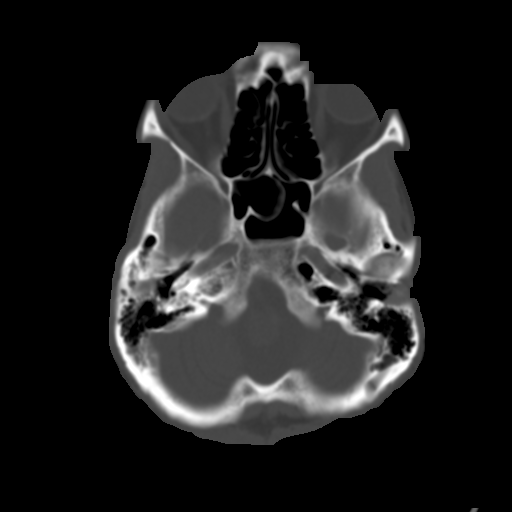
[im 6/29  brain]
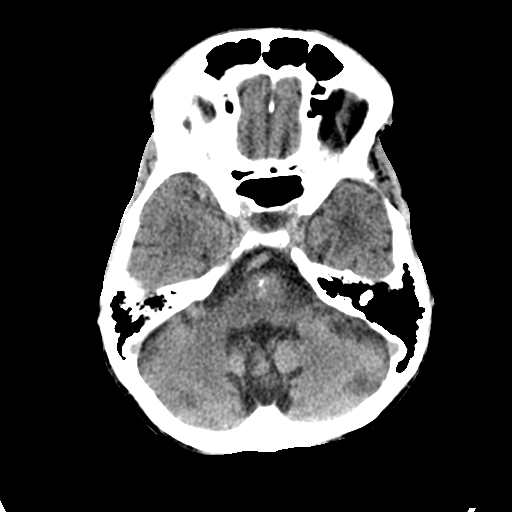
[im 9/29  brain]
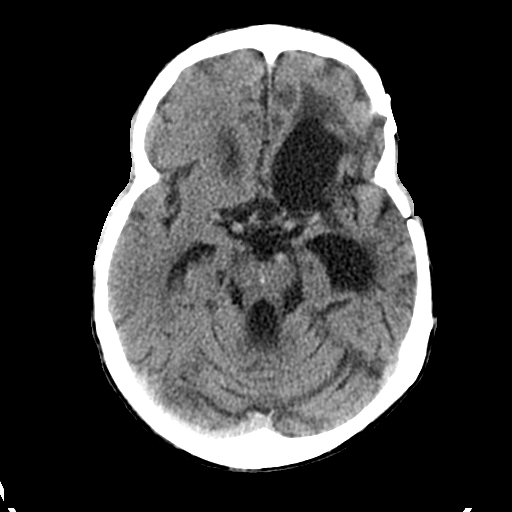
[im 12/29  brain]
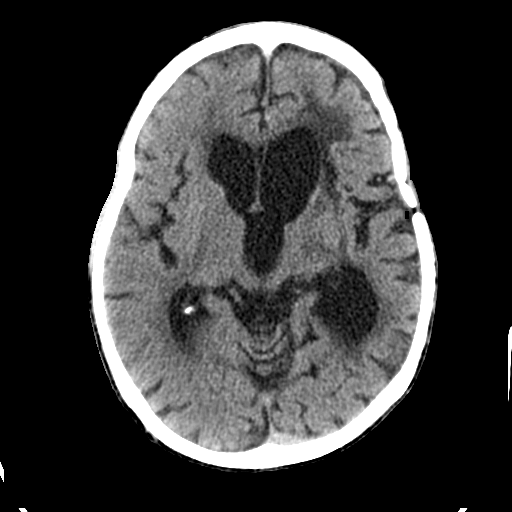
[im 15/29  brain]
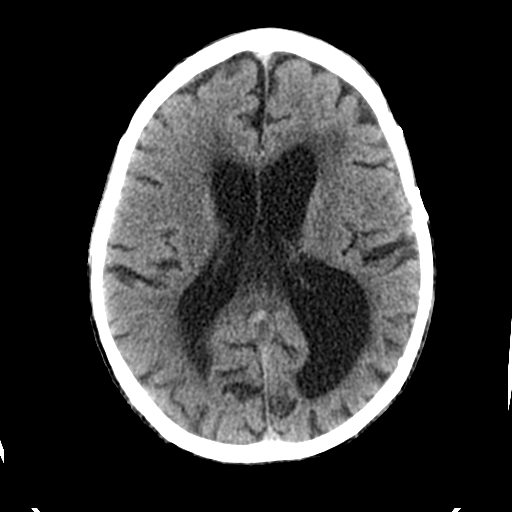
[im 15/29  bone]
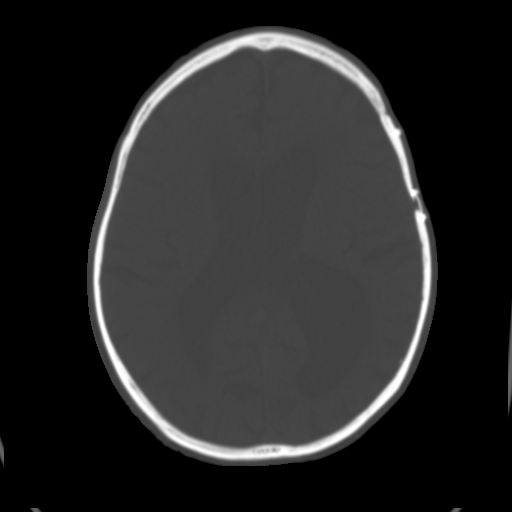
[im 18/29  brain]
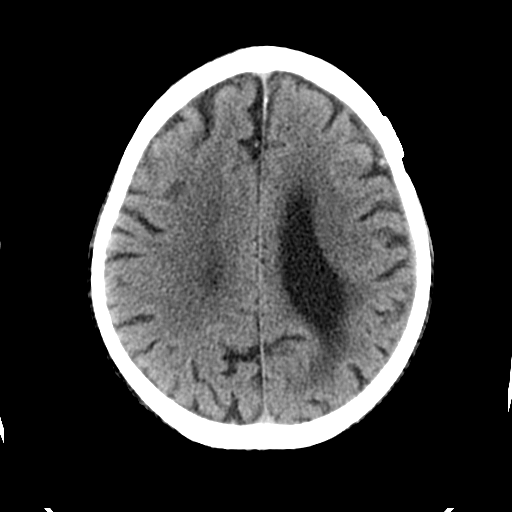
[im 21/29  brain]
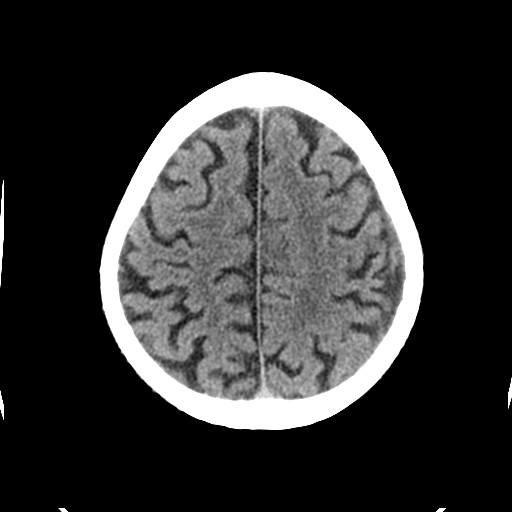
[im 24/29  brain]
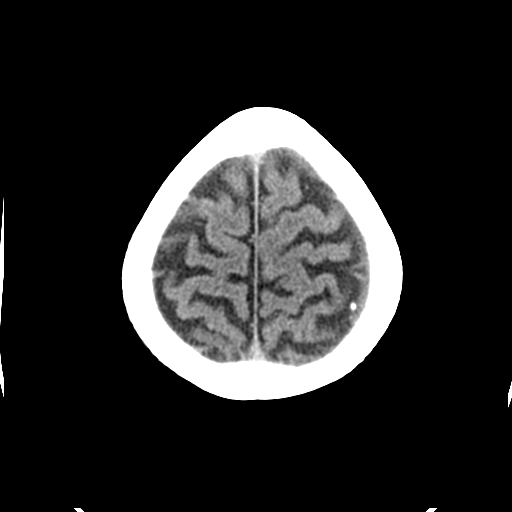
[im 27/29  brain]
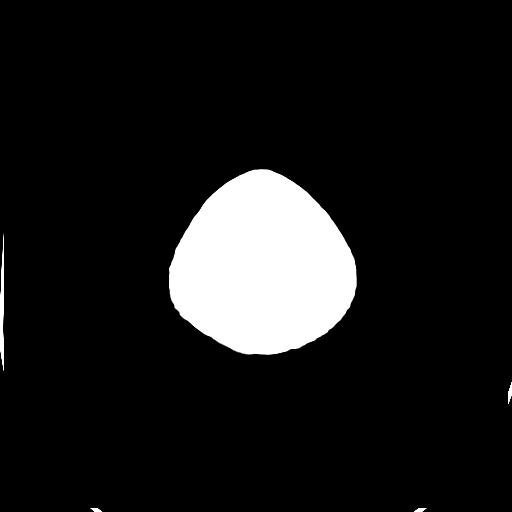
[im 27/29  bone]
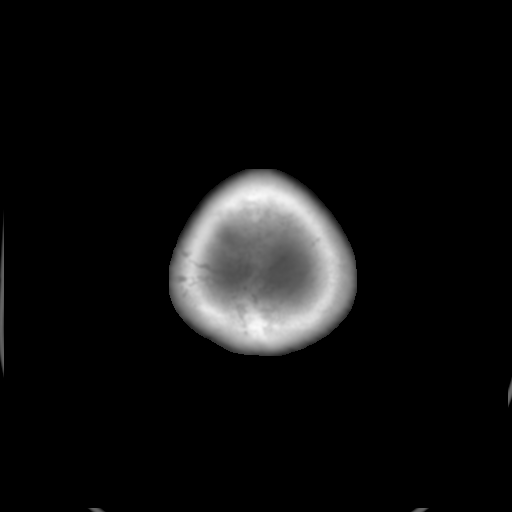

[Series 5: coronal soft tissue · coronal · 0.30mm/px · 3 of 64 slices shown]
[im 22/64  brain]
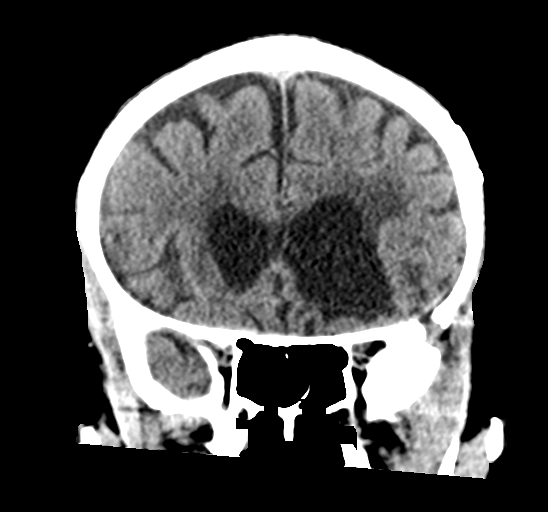
[im 29/64  brain]
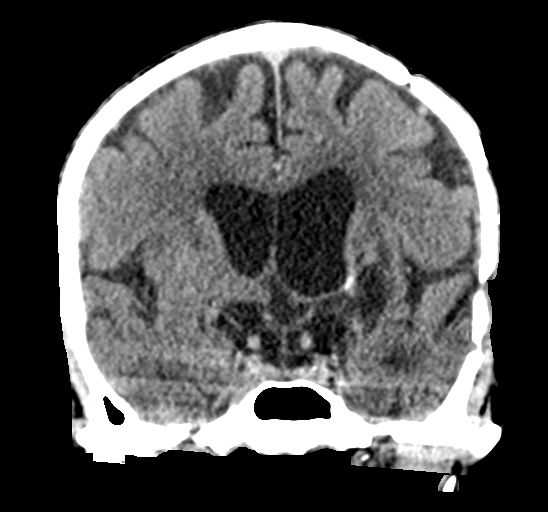
[im 36/64  brain]
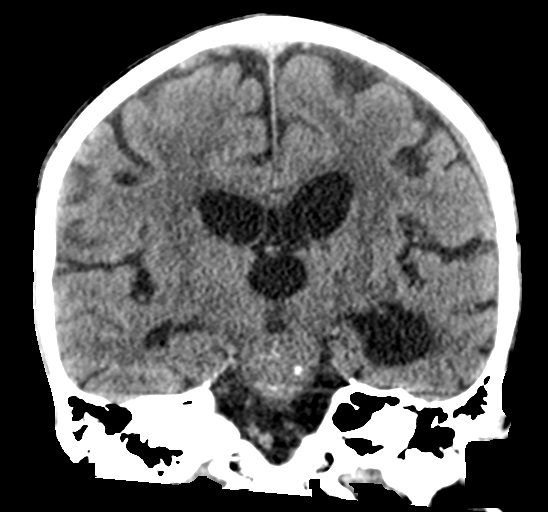

[Series 6: sagittal soft tissue · sagittal · 0.29mm/px · 3 of 51 slices shown]
[im 18/51  brain]
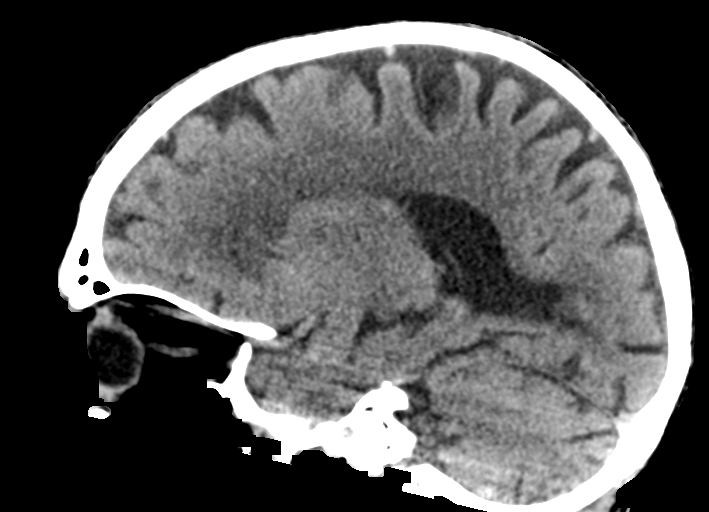
[im 26/51  brain]
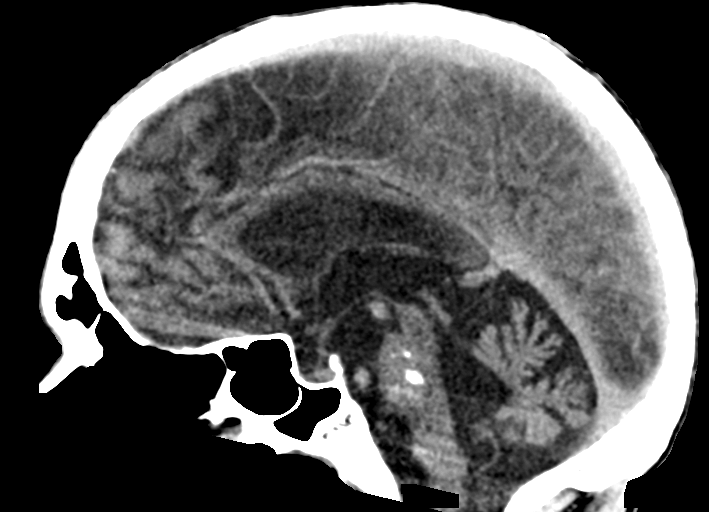
[im 33/51  brain]
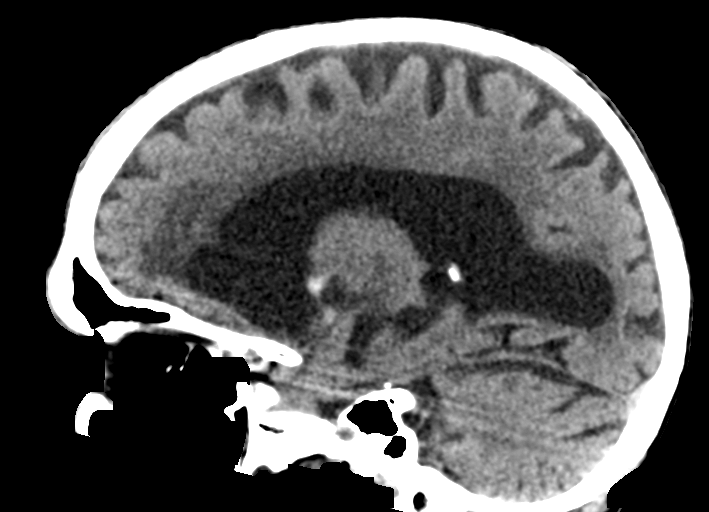

[15 of 46 positions shown; findings below may reference images not displayed]

FINDINGS: Brain: No evidence of acute infarction, hemorrhage, hydrocephalus,
extra-axial collection or mass lesion/mass effect.

Prominence of the ventricles and sulci reflects moderate cortical
volume loss. Mild cerebellar atrophy is noted. Scattered
periventricular and subcortical white matter change likely reflects
small vessel ischemic microangiopathy. Chronic encephalomalacia is
noted at the left basal ganglia, and there is ex vacuo dilatation of
the left lateral ventricle.

Calcification is seen at the pons. No mass effect or midline shift
is seen. Chronic subdural thickening is noted on the left side.

Vascular: No hyperdense vessel or unexpected calcification.

Skull: There is no evidence of fracture; the patient is status post
left frontoparietal craniotomy.

Sinuses/Orbits: The visualized portions of the orbits are within
normal limits. The paranasal sinuses and mastoid air cells are
well-aerated.

Other: No significant soft tissue abnormalities are seen.
IMPRESSION: 1. No acute intracranial pathology seen on CT.
2. Moderate cortical volume loss and scattered small vessel ischemic
microangiopathy.
3. Chronic encephalomalacia at the left basal ganglia, with ex vacuo
dilatation of the left lateral ventricle. Chronic subdural
thickening on the left side.

## 2018-04-01 IMAGING — RF DG FLUORO GUIDE LUMBAR PUNCTURE
1 series · 1 of 1 positions shown · non-contrast
Comparison: none

CLINICAL DATA: Abnormal mental status.

[Series 1: cp_standard · 0.18mm/px · 1 of 1 slices shown]
[im 1/1]
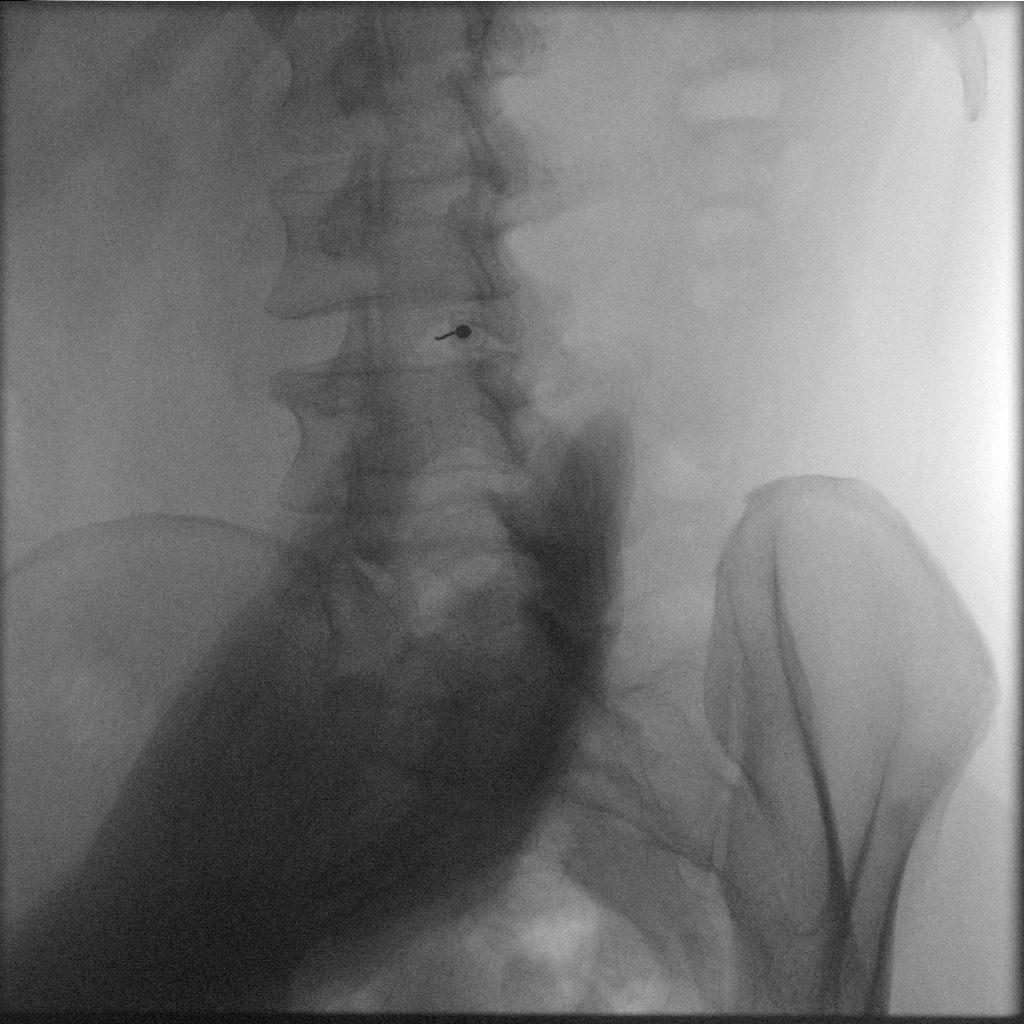

[1 of 1 positions shown; findings below may reference images not displayed]

EXAM:
DIAGNOSTIC LUMBAR PUNCTURE UNDER FLUOROSCOPIC GUIDANCE

FLUOROSCOPY TIME:  Fluoroscopy Time:  0 minutes 20 seconds

Radiation Exposure Index (if provided by the fluoroscopic device):
5.4 mGy

Number of Acquired Spot Images: 1

PROCEDURE:
Patient unable to give consent. After discussing the risks and
benefits of this procedure with the patient's mother informed
consent was obtained. Back was sterilely prepped and draped. Local
anesthesia administered 1% lidocaine. 22 gauge needle was advanced
into the lumbar canal and clear 9 cc of clear CSF removed and sent
for the requested labs. Opening pressure was 9 cm of water. Needle
was removed. Hemostasis achieved. No complications.
IMPRESSION: Successful fluoroscopically directed lumbar puncture.
